# Patient Record
Sex: Female | Born: 1935 | Race: White | Hispanic: No | Smoking: Never smoker
Health system: Southern US, Community
[De-identification: ages and names within clinical notes are randomized; demographics above are authoritative.]

## PROBLEM LIST (undated history)

## (undated) DIAGNOSIS — R21 Rash and other nonspecific skin eruption: Secondary | ICD-10-CM

## (undated) DIAGNOSIS — L570 Actinic keratosis: Secondary | ICD-10-CM

## (undated) DIAGNOSIS — H3530 Unspecified macular degeneration: Secondary | ICD-10-CM

## (undated) DIAGNOSIS — R5381 Other malaise: Secondary | ICD-10-CM

## (undated) DIAGNOSIS — E559 Vitamin D deficiency, unspecified: Secondary | ICD-10-CM

## (undated) DIAGNOSIS — I5042 Chronic combined systolic (congestive) and diastolic (congestive) heart failure: Secondary | ICD-10-CM

## (undated) DIAGNOSIS — K5730 Diverticulosis of large intestine without perforation or abscess without bleeding: Secondary | ICD-10-CM

## (undated) DIAGNOSIS — R0600 Dyspnea, unspecified: Secondary | ICD-10-CM

## (undated) DIAGNOSIS — M6281 Muscle weakness (generalized): Secondary | ICD-10-CM

## (undated) DIAGNOSIS — R54 Age-related physical debility: Secondary | ICD-10-CM

## (undated) DIAGNOSIS — R252 Cramp and spasm: Secondary | ICD-10-CM

## (undated) DIAGNOSIS — I10 Essential (primary) hypertension: Secondary | ICD-10-CM

## (undated) DIAGNOSIS — J301 Allergic rhinitis due to pollen: Secondary | ICD-10-CM

## (undated) DIAGNOSIS — I872 Venous insufficiency (chronic) (peripheral): Secondary | ICD-10-CM

## (undated) DIAGNOSIS — R002 Palpitations: Secondary | ICD-10-CM

## (undated) DIAGNOSIS — K449 Diaphragmatic hernia without obstruction or gangrene: Secondary | ICD-10-CM

## (undated) DIAGNOSIS — R32 Unspecified urinary incontinence: Secondary | ICD-10-CM

## (undated) DIAGNOSIS — B353 Tinea pedis: Secondary | ICD-10-CM

## (undated) DIAGNOSIS — E039 Hypothyroidism, unspecified: Secondary | ICD-10-CM

## (undated) DIAGNOSIS — G4700 Insomnia, unspecified: Secondary | ICD-10-CM

## (undated) DIAGNOSIS — H8110 Benign paroxysmal vertigo, unspecified ear: Secondary | ICD-10-CM

## (undated) DIAGNOSIS — M25569 Pain in unspecified knee: Secondary | ICD-10-CM

## (undated) DIAGNOSIS — R05 Cough: Secondary | ICD-10-CM

## (undated) DIAGNOSIS — N72 Inflammatory disease of cervix uteri: Secondary | ICD-10-CM

## (undated) DIAGNOSIS — G44209 Tension-type headache, unspecified, not intractable: Secondary | ICD-10-CM

## (undated) DIAGNOSIS — M545 Low back pain: Secondary | ICD-10-CM

## (undated) DIAGNOSIS — N183 Chronic kidney disease, stage 3 unspecified: Secondary | ICD-10-CM

## (undated) DIAGNOSIS — K224 Dyskinesia of esophagus: Secondary | ICD-10-CM

## (undated) DIAGNOSIS — I5032 Chronic diastolic (congestive) heart failure: Secondary | ICD-10-CM

## (undated) DIAGNOSIS — K279 Peptic ulcer, site unspecified, unspecified as acute or chronic, without hemorrhage or perforation: Secondary | ICD-10-CM

## (undated) DIAGNOSIS — N390 Urinary tract infection, site not specified: Secondary | ICD-10-CM

## (undated) DIAGNOSIS — R5383 Other fatigue: Secondary | ICD-10-CM

## (undated) DIAGNOSIS — M79609 Pain in unspecified limb: Secondary | ICD-10-CM

## (undated) DIAGNOSIS — I8390 Asymptomatic varicose veins of unspecified lower extremity: Secondary | ICD-10-CM

## (undated) DIAGNOSIS — I340 Nonrheumatic mitral (valve) insufficiency: Secondary | ICD-10-CM

## (undated) DIAGNOSIS — R42 Dizziness and giddiness: Secondary | ICD-10-CM

## (undated) DIAGNOSIS — Z79899 Other long term (current) drug therapy: Secondary | ICD-10-CM

## (undated) DIAGNOSIS — M542 Cervicalgia: Secondary | ICD-10-CM

## (undated) DIAGNOSIS — H0230 Blepharochalasis unspecified eye, unspecified eyelid: Secondary | ICD-10-CM

## (undated) DIAGNOSIS — M25561 Pain in right knee: Secondary | ICD-10-CM

## (undated) DIAGNOSIS — K210 Gastro-esophageal reflux disease with esophagitis: Secondary | ICD-10-CM

## (undated) DIAGNOSIS — R269 Unspecified abnormalities of gait and mobility: Secondary | ICD-10-CM

## (undated) DIAGNOSIS — K5900 Constipation, unspecified: Secondary | ICD-10-CM

## (undated) DIAGNOSIS — K648 Other hemorrhoids: Secondary | ICD-10-CM

## (undated) DIAGNOSIS — G2581 Restless legs syndrome: Secondary | ICD-10-CM

## (undated) HISTORY — DX: Cough: R05

## (undated) HISTORY — DX: Gastro-esophageal reflux disease with esophagitis: K21.0

## (undated) HISTORY — DX: Blepharochalasis unspecified eye, unspecified eyelid: H02.30

## (undated) HISTORY — DX: Peptic ulcer, site unspecified, unspecified as acute or chronic, without hemorrhage or perforation: K27.9

## (undated) HISTORY — DX: Urinary tract infection, site not specified: N39.0

## (undated) HISTORY — DX: Cervicalgia: M54.2

## (undated) HISTORY — DX: Diaphragmatic hernia without obstruction or gangrene: K44.9

## (undated) HISTORY — DX: Insomnia, unspecified: G47.00

## (undated) HISTORY — DX: Diverticulosis of large intestine without perforation or abscess without bleeding: K57.30

## (undated) HISTORY — DX: Other malaise: R53.81

## (undated) HISTORY — DX: Vitamin D deficiency, unspecified: E55.9

## (undated) HISTORY — DX: Nonrheumatic mitral (valve) insufficiency: I34.0

## (undated) HISTORY — DX: Pain in unspecified knee: M25.569

## (undated) HISTORY — DX: Unspecified abnormalities of gait and mobility: R26.9

## (undated) HISTORY — DX: Palpitations: R00.2

## (undated) HISTORY — DX: Low back pain: M54.5

## (undated) HISTORY — DX: Venous insufficiency (chronic) (peripheral): I87.2

## (undated) HISTORY — DX: Restless legs syndrome: G25.81

## (undated) HISTORY — DX: Tension-type headache, unspecified, not intractable: G44.209

## (undated) HISTORY — DX: Dyspnea, unspecified: R06.00

## (undated) HISTORY — DX: Dizziness and giddiness: R42

## (undated) HISTORY — DX: Essential (primary) hypertension: I10

## (undated) HISTORY — DX: Other hemorrhoids: K64.8

## (undated) HISTORY — DX: Age-related physical debility: R54

## (undated) HISTORY — DX: Pain in right knee: M25.561

## (undated) HISTORY — DX: Actinic keratosis: L57.0

## (undated) HISTORY — DX: Inflammatory disease of cervix uteri: N72

## (undated) HISTORY — DX: Unspecified macular degeneration: H35.30

## (undated) HISTORY — DX: Constipation, unspecified: K59.00

## (undated) HISTORY — DX: Rash and other nonspecific skin eruption: R21

## (undated) HISTORY — DX: Other fatigue: R53.83

## (undated) HISTORY — DX: Other long term (current) drug therapy: Z79.899

## (undated) HISTORY — DX: Allergic rhinitis due to pollen: J30.1

## (undated) HISTORY — DX: Muscle weakness (generalized): M62.81

## (undated) HISTORY — DX: Asymptomatic varicose veins of unspecified lower extremity: I83.90

## (undated) HISTORY — DX: Tinea pedis: B35.3

## (undated) HISTORY — DX: Pain in unspecified limb: M79.609

## (undated) HISTORY — DX: Unspecified urinary incontinence: R32

## (undated) HISTORY — DX: Dyskinesia of esophagus: K22.4

## (undated) HISTORY — DX: Benign paroxysmal vertigo, unspecified ear: H81.10

## (undated) HISTORY — DX: Cramp and spasm: R25.2

---
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
## 1978-09-06 DIAGNOSIS — H3530 Unspecified macular degeneration: Secondary | ICD-10-CM

## 1978-09-06 HISTORY — DX: Unspecified macular degeneration: H35.30

## 1980-01-06 HISTORY — PX: CATARACT EXTRACTION W/ INTRAOCULAR LENS  IMPLANT, BILATERAL: SHX1307

## 1996-01-06 DIAGNOSIS — K2100 Gastro-esophageal reflux disease with esophagitis, without bleeding: Secondary | ICD-10-CM

## 1996-01-06 DIAGNOSIS — K279 Peptic ulcer, site unspecified, unspecified as acute or chronic, without hemorrhage or perforation: Secondary | ICD-10-CM

## 1996-01-06 DIAGNOSIS — K5730 Diverticulosis of large intestine without perforation or abscess without bleeding: Secondary | ICD-10-CM

## 1996-01-06 HISTORY — DX: Diverticulosis of large intestine without perforation or abscess without bleeding: K57.30

## 1996-01-06 HISTORY — DX: Peptic ulcer, site unspecified, unspecified as acute or chronic, without hemorrhage or perforation: K27.9

## 1996-01-06 HISTORY — DX: Gastro-esophageal reflux disease with esophagitis, without bleeding: K21.00

## 1998-01-05 DIAGNOSIS — I872 Venous insufficiency (chronic) (peripheral): Secondary | ICD-10-CM

## 1998-01-05 HISTORY — PX: COLONOSCOPY: SHX174

## 1998-01-05 HISTORY — DX: Venous insufficiency (chronic) (peripheral): I87.2

## 1998-12-03 ENCOUNTER — Ambulatory Visit (HOSPITAL_COMMUNITY): Admission: RE | Admit: 1998-12-03 | Discharge: 1998-12-03 | Payer: Self-pay | Admitting: Internal Medicine

## 1998-12-03 ENCOUNTER — Encounter: Payer: Self-pay | Admitting: Internal Medicine

## 1999-01-01 ENCOUNTER — Inpatient Hospital Stay (HOSPITAL_COMMUNITY): Admission: EM | Admit: 1999-01-01 | Discharge: 1999-01-05 | Payer: Self-pay | Admitting: Family Medicine

## 1999-01-02 ENCOUNTER — Encounter: Payer: Self-pay | Admitting: Internal Medicine

## 1999-01-03 ENCOUNTER — Encounter (HOSPITAL_BASED_OUTPATIENT_CLINIC_OR_DEPARTMENT_OTHER): Payer: Self-pay | Admitting: Internal Medicine

## 1999-01-31 ENCOUNTER — Encounter: Admission: RE | Admit: 1999-01-31 | Discharge: 1999-01-31 | Payer: Self-pay | Admitting: Internal Medicine

## 1999-01-31 ENCOUNTER — Encounter: Payer: Self-pay | Admitting: Internal Medicine

## 1999-03-06 ENCOUNTER — Encounter: Payer: Self-pay | Admitting: Internal Medicine

## 1999-03-06 ENCOUNTER — Emergency Department (HOSPITAL_COMMUNITY): Admission: EM | Admit: 1999-03-06 | Discharge: 1999-03-06 | Payer: Self-pay | Admitting: Internal Medicine

## 2000-02-13 ENCOUNTER — Encounter: Payer: Self-pay | Admitting: Internal Medicine

## 2000-02-13 ENCOUNTER — Encounter: Admission: RE | Admit: 2000-02-13 | Discharge: 2000-02-13 | Payer: Self-pay | Admitting: Internal Medicine

## 2001-03-29 ENCOUNTER — Encounter: Payer: Self-pay | Admitting: Internal Medicine

## 2001-03-29 ENCOUNTER — Encounter: Admission: RE | Admit: 2001-03-29 | Discharge: 2001-03-29 | Payer: Self-pay | Admitting: Internal Medicine

## 2001-11-07 ENCOUNTER — Emergency Department (HOSPITAL_COMMUNITY): Admission: EM | Admit: 2001-11-07 | Discharge: 2001-11-07 | Payer: Self-pay | Admitting: Emergency Medicine

## 2001-11-07 ENCOUNTER — Encounter: Payer: Self-pay | Admitting: *Deleted

## 2002-03-29 ENCOUNTER — Encounter: Payer: Self-pay | Admitting: Internal Medicine

## 2002-03-29 ENCOUNTER — Encounter: Admission: RE | Admit: 2002-03-29 | Discharge: 2002-03-29 | Payer: Self-pay | Admitting: Internal Medicine

## 2002-04-06 HISTORY — PX: LESION EXCISION: SHX5167

## 2002-05-03 ENCOUNTER — Encounter: Payer: Self-pay | Admitting: Internal Medicine

## 2002-05-03 ENCOUNTER — Inpatient Hospital Stay (HOSPITAL_COMMUNITY): Admission: EM | Admit: 2002-05-03 | Discharge: 2002-05-05 | Payer: Self-pay | Admitting: Emergency Medicine

## 2002-05-04 ENCOUNTER — Encounter: Payer: Self-pay | Admitting: Internal Medicine

## 2002-05-04 ENCOUNTER — Encounter (INDEPENDENT_AMBULATORY_CARE_PROVIDER_SITE_OTHER): Payer: Self-pay | Admitting: Cardiology

## 2002-05-05 ENCOUNTER — Encounter: Payer: Self-pay | Admitting: Internal Medicine

## 2003-01-06 HISTORY — PX: SKIN CANCER EXCISION: SHX779

## 2003-04-20 ENCOUNTER — Encounter: Admission: RE | Admit: 2003-04-20 | Discharge: 2003-04-20 | Payer: Self-pay | Admitting: Internal Medicine

## 2003-09-08 ENCOUNTER — Emergency Department (HOSPITAL_COMMUNITY): Admission: EM | Admit: 2003-09-08 | Discharge: 2003-09-08 | Payer: Self-pay | Admitting: Emergency Medicine

## 2003-10-03 DIAGNOSIS — K449 Diaphragmatic hernia without obstruction or gangrene: Secondary | ICD-10-CM

## 2003-10-03 HISTORY — DX: Diaphragmatic hernia without obstruction or gangrene: K44.9

## 2004-06-04 ENCOUNTER — Encounter: Admission: RE | Admit: 2004-06-04 | Discharge: 2004-06-04 | Payer: Self-pay | Admitting: Internal Medicine

## 2004-09-10 DIAGNOSIS — E039 Hypothyroidism, unspecified: Secondary | ICD-10-CM | POA: Insufficient documentation

## 2004-10-05 HISTORY — PX: CHOLECYSTECTOMY, LAPAROSCOPIC: SHX56

## 2004-10-26 ENCOUNTER — Emergency Department (HOSPITAL_COMMUNITY): Admission: EM | Admit: 2004-10-26 | Discharge: 2004-10-27 | Payer: Self-pay | Admitting: Emergency Medicine

## 2004-11-03 ENCOUNTER — Encounter (INDEPENDENT_AMBULATORY_CARE_PROVIDER_SITE_OTHER): Payer: Self-pay | Admitting: Specialist

## 2004-11-03 ENCOUNTER — Ambulatory Visit (HOSPITAL_COMMUNITY): Admission: RE | Admit: 2004-11-03 | Discharge: 2004-11-04 | Payer: Self-pay

## 2005-06-30 DIAGNOSIS — G2581 Restless legs syndrome: Secondary | ICD-10-CM

## 2005-06-30 HISTORY — DX: Restless legs syndrome: G25.81

## 2005-07-01 ENCOUNTER — Encounter: Admission: RE | Admit: 2005-07-01 | Discharge: 2005-07-01 | Payer: Self-pay | Admitting: Internal Medicine

## 2005-10-07 DIAGNOSIS — R252 Cramp and spasm: Secondary | ICD-10-CM

## 2005-10-07 HISTORY — DX: Cramp and spasm: R25.2

## 2006-02-10 DIAGNOSIS — R32 Unspecified urinary incontinence: Secondary | ICD-10-CM

## 2006-02-10 HISTORY — DX: Unspecified urinary incontinence: R32

## 2006-03-21 ENCOUNTER — Emergency Department (HOSPITAL_COMMUNITY): Admission: EM | Admit: 2006-03-21 | Discharge: 2006-03-21 | Payer: Self-pay | Admitting: Emergency Medicine

## 2006-04-01 ENCOUNTER — Encounter: Admission: RE | Admit: 2006-04-01 | Discharge: 2006-04-01 | Payer: Self-pay | Admitting: *Deleted

## 2006-04-01 DIAGNOSIS — R5381 Other malaise: Secondary | ICD-10-CM

## 2006-04-01 HISTORY — DX: Other malaise: R53.81

## 2006-04-04 ENCOUNTER — Inpatient Hospital Stay (HOSPITAL_COMMUNITY): Admission: EM | Admit: 2006-04-04 | Discharge: 2006-04-10 | Payer: Self-pay | Admitting: Emergency Medicine

## 2006-04-12 DIAGNOSIS — K224 Dyskinesia of esophagus: Secondary | ICD-10-CM

## 2006-04-12 HISTORY — DX: Dyskinesia of esophagus: K22.4

## 2006-04-13 ENCOUNTER — Inpatient Hospital Stay (HOSPITAL_COMMUNITY): Admission: EM | Admit: 2006-04-13 | Discharge: 2006-04-19 | Payer: Self-pay | Admitting: Emergency Medicine

## 2006-04-21 DIAGNOSIS — J301 Allergic rhinitis due to pollen: Secondary | ICD-10-CM

## 2006-04-21 HISTORY — DX: Allergic rhinitis due to pollen: J30.1

## 2006-04-27 ENCOUNTER — Ambulatory Visit: Payer: Self-pay | Admitting: Critical Care Medicine

## 2006-04-29 ENCOUNTER — Inpatient Hospital Stay (HOSPITAL_COMMUNITY): Admission: EM | Admit: 2006-04-29 | Discharge: 2006-05-06 | Payer: Self-pay | Admitting: Emergency Medicine

## 2006-05-05 ENCOUNTER — Ambulatory Visit: Payer: Self-pay | Admitting: Vascular Surgery

## 2006-05-10 ENCOUNTER — Inpatient Hospital Stay (HOSPITAL_COMMUNITY): Admission: EM | Admit: 2006-05-10 | Discharge: 2006-05-14 | Payer: Self-pay | Admitting: *Deleted

## 2006-06-25 DIAGNOSIS — R42 Dizziness and giddiness: Secondary | ICD-10-CM

## 2006-06-25 HISTORY — DX: Dizziness and giddiness: R42

## 2006-07-14 DIAGNOSIS — B353 Tinea pedis: Secondary | ICD-10-CM

## 2006-07-14 HISTORY — DX: Tinea pedis: B35.3

## 2006-07-21 ENCOUNTER — Encounter: Admission: RE | Admit: 2006-07-21 | Discharge: 2006-07-21 | Payer: Self-pay | Admitting: Internal Medicine

## 2006-10-11 ENCOUNTER — Ambulatory Visit: Payer: Self-pay | Admitting: Internal Medicine

## 2007-05-12 DIAGNOSIS — H0230 Blepharochalasis unspecified eye, unspecified eyelid: Secondary | ICD-10-CM

## 2007-05-12 DIAGNOSIS — G4700 Insomnia, unspecified: Secondary | ICD-10-CM

## 2007-05-12 HISTORY — DX: Blepharochalasis unspecified eye, unspecified eyelid: H02.30

## 2007-05-12 HISTORY — DX: Insomnia, unspecified: G47.00

## 2007-08-04 DIAGNOSIS — M6281 Muscle weakness (generalized): Secondary | ICD-10-CM

## 2007-08-04 HISTORY — DX: Muscle weakness (generalized): M62.81

## 2007-08-19 ENCOUNTER — Encounter: Admission: RE | Admit: 2007-08-19 | Discharge: 2007-08-19 | Payer: Self-pay | Admitting: Internal Medicine

## 2007-12-22 DIAGNOSIS — N72 Inflammatory disease of cervix uteri: Secondary | ICD-10-CM

## 2007-12-22 DIAGNOSIS — L570 Actinic keratosis: Secondary | ICD-10-CM

## 2007-12-22 DIAGNOSIS — M79609 Pain in unspecified limb: Secondary | ICD-10-CM

## 2007-12-22 HISTORY — DX: Pain in unspecified limb: M79.609

## 2007-12-22 HISTORY — DX: Actinic keratosis: L57.0

## 2007-12-22 HISTORY — DX: Inflammatory disease of cervix uteri: N72

## 2008-01-05 DIAGNOSIS — M545 Low back pain, unspecified: Secondary | ICD-10-CM

## 2008-01-05 HISTORY — DX: Low back pain, unspecified: M54.50

## 2008-06-05 HISTORY — PX: TOOTH EXTRACTION: SHX859

## 2008-07-25 IMAGING — CR DG CHEST 2V
2 series · 2 of 2 positions shown · non-contrast
Comparison: 03/21/06.

CLINICAL DATA: Cough for 2 weeks. 
 CHEST ? 2 VIEW:

[view not recorded (1 of 2)]
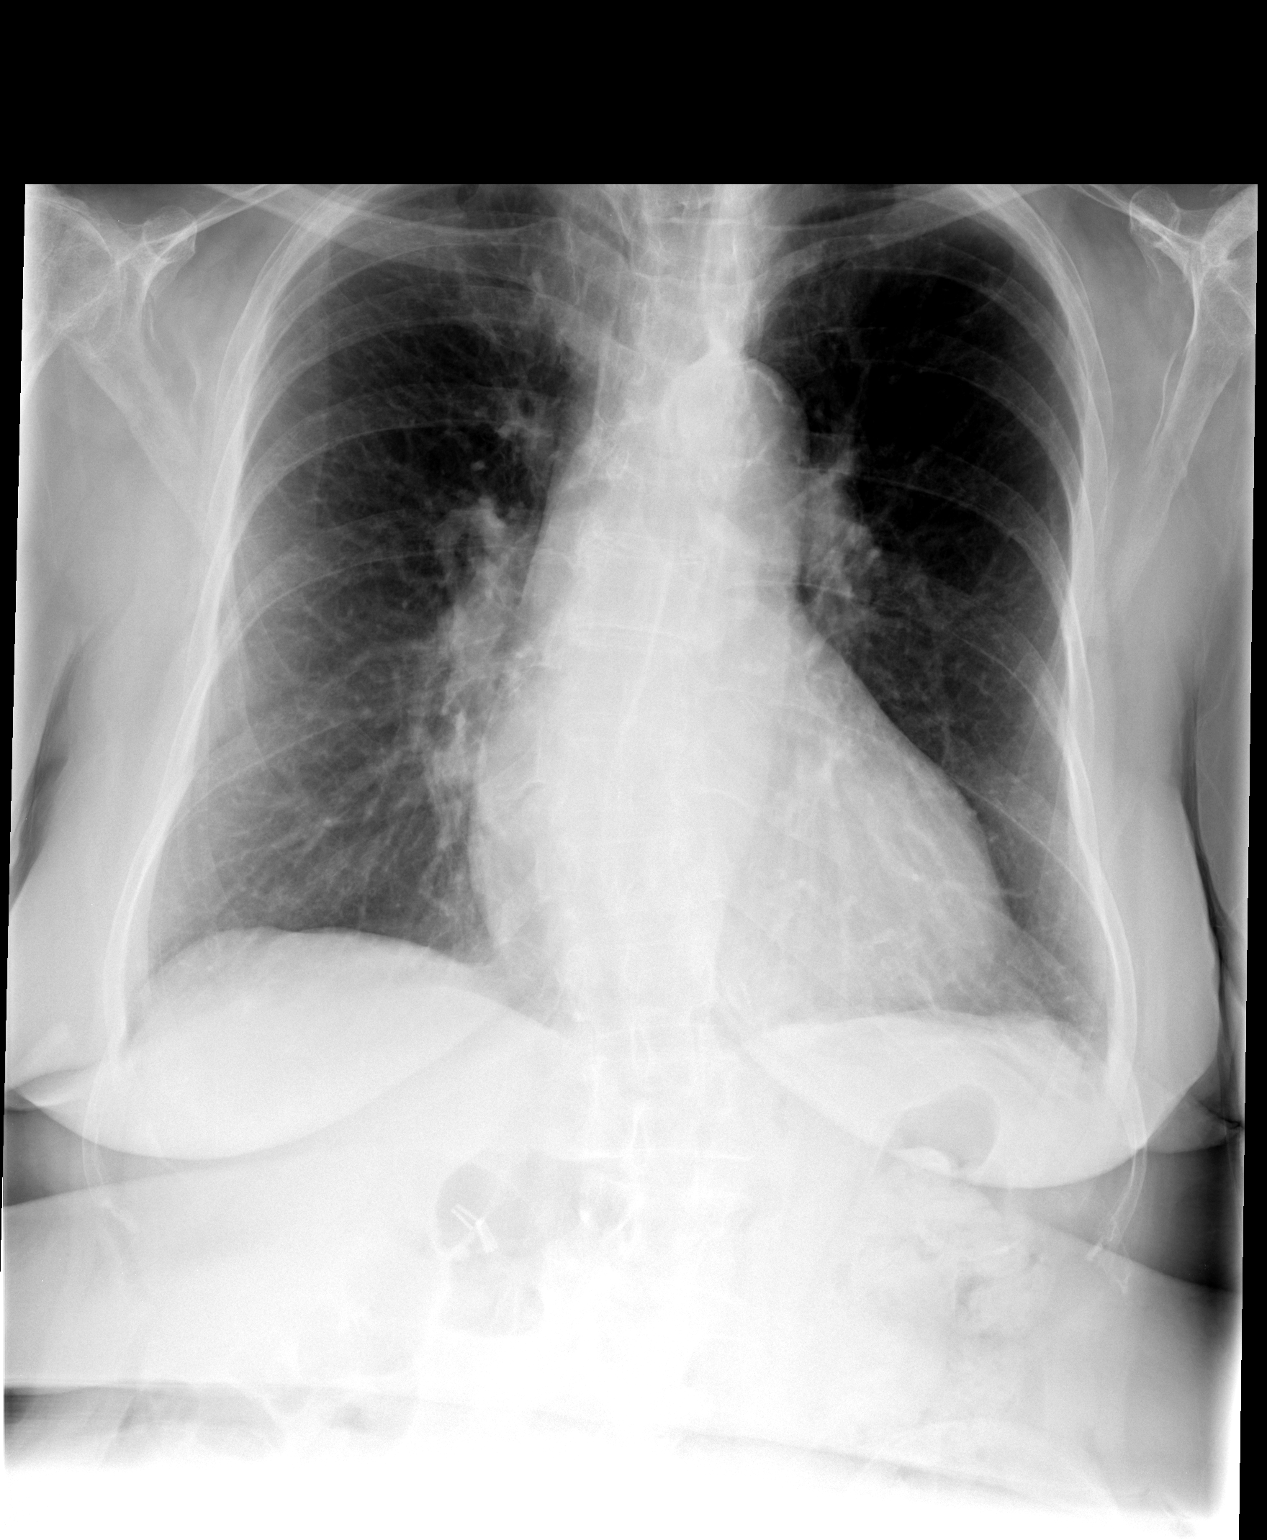

[view not recorded (2 of 2)]
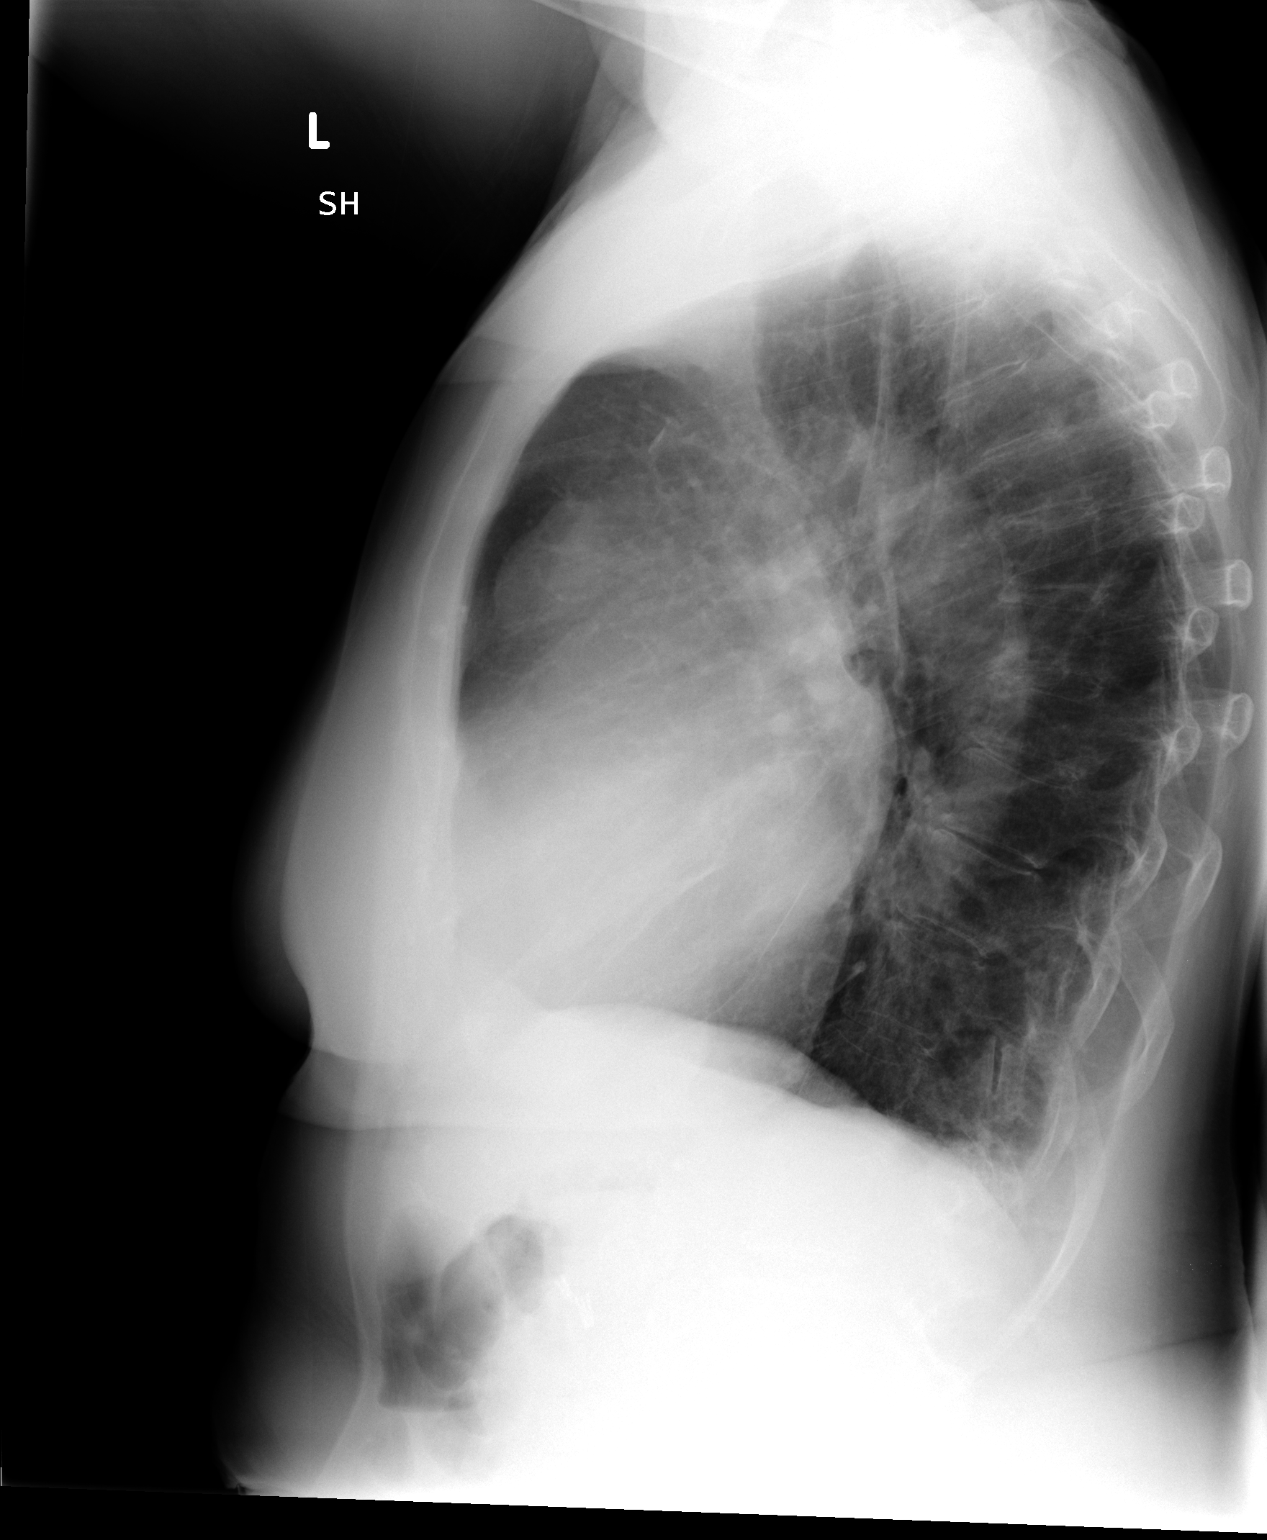

[2 of 2 positions shown; findings below may reference images not displayed]

FINDINGS: The heart is enlarged.  The thoracic aorta is calcified and unfolded.  The lungs show some slightly increased markings which appear chronic.  There is no evidence of pneumonia.  A lobular contour in the anterior mediastinum seen on the lateral view is again present but has been seen on previous films.  It is possible this could represent an aneurysm.  I think it is becoming slightly increasingly prominent over time.  A CT scan might be considered for evaluation of this.
IMPRESSION: 1.  Mild cardiomegaly. 
 2.  Chronic lung markings without evidence of acute pneumonia. 
 3.  Increasingly prominent lobular shadow in the anterior mediastinum on the lateral view.  This could represent an aneurysm or conceivably a mass.  CT scan suggested if clinically appropriate.

## 2008-08-20 ENCOUNTER — Ambulatory Visit (HOSPITAL_COMMUNITY): Admission: RE | Admit: 2008-08-20 | Discharge: 2008-08-20 | Payer: Self-pay | Admitting: Internal Medicine

## 2009-01-25 DIAGNOSIS — E559 Vitamin D deficiency, unspecified: Secondary | ICD-10-CM

## 2009-01-25 HISTORY — DX: Vitamin D deficiency, unspecified: E55.9

## 2009-05-09 DIAGNOSIS — R269 Unspecified abnormalities of gait and mobility: Secondary | ICD-10-CM

## 2009-05-09 HISTORY — DX: Unspecified abnormalities of gait and mobility: R26.9

## 2009-08-15 ENCOUNTER — Ambulatory Visit: Payer: Self-pay | Admitting: Cardiology

## 2009-12-12 ENCOUNTER — Ambulatory Visit: Payer: Self-pay | Admitting: Cardiology

## 2010-04-01 ENCOUNTER — Encounter: Payer: Self-pay | Admitting: Internal Medicine

## 2010-04-16 ENCOUNTER — Encounter: Payer: Self-pay | Admitting: *Deleted

## 2010-04-17 ENCOUNTER — Encounter: Payer: Self-pay | Admitting: Cardiology

## 2010-04-17 ENCOUNTER — Ambulatory Visit (INDEPENDENT_AMBULATORY_CARE_PROVIDER_SITE_OTHER): Payer: Medicare Other | Admitting: Cardiology

## 2010-04-17 DIAGNOSIS — I509 Heart failure, unspecified: Secondary | ICD-10-CM

## 2010-04-17 DIAGNOSIS — M48061 Spinal stenosis, lumbar region without neurogenic claudication: Secondary | ICD-10-CM | POA: Insufficient documentation

## 2010-04-17 DIAGNOSIS — I059 Rheumatic mitral valve disease, unspecified: Secondary | ICD-10-CM

## 2010-04-17 DIAGNOSIS — I340 Nonrheumatic mitral (valve) insufficiency: Secondary | ICD-10-CM

## 2010-04-17 DIAGNOSIS — E039 Hypothyroidism, unspecified: Secondary | ICD-10-CM | POA: Insufficient documentation

## 2010-04-17 DIAGNOSIS — I119 Hypertensive heart disease without heart failure: Secondary | ICD-10-CM

## 2010-04-17 DIAGNOSIS — R002 Palpitations: Secondary | ICD-10-CM | POA: Insufficient documentation

## 2010-04-17 DIAGNOSIS — I5032 Chronic diastolic (congestive) heart failure: Secondary | ICD-10-CM

## 2010-04-17 NOTE — Assessment & Plan Note (Signed)
Recently the patient has been doing well in terms of her palpitations.  She's not been aware of any recent arrhythmia.  Her electrocardiogram today shows normal sinus rhythm at a rate of 64.

## 2010-04-17 NOTE — Assessment & Plan Note (Signed)
The patient has a past history of mitral regurgitation.  Her last echocardiogram was 05/10/09.  She has not had any recent change in cardiac symptoms.  She's not having any paroxysmal nocturnal dyspnea.  She's not having any exertional chest pain or angina.

## 2010-04-17 NOTE — Assessment & Plan Note (Signed)
The patient has been having a lot of difficulty recently with low back pain.  Dr. Otelia Sergeant is her orthopedist.  Dr. Alvester Morin recently gave her a steroid injection into the spine which has helped considerably.

## 2010-04-17 NOTE — Progress Notes (Signed)
HPI:   Current Outpatient Prescriptions  Medication Sig Dispense Refill  . aspirin 81 MG tablet Take 81 mg by mouth daily.        . Calcium & Magnesium Carbonates (MYLANTA PO) Take by mouth as needed.        . calcium-vitamin D (OSCAL WITH D) 500-200 MG-UNIT per tablet Take 1 tablet by mouth daily.        . Cetirizine HCl (ZYRTEC PO) Take by mouth as needed.        . furosemide (LASIX) 20 MG tablet Take 20 mg by mouth daily.        . GuaiFENesin (MUCINEX PO) Take by mouth as needed.        Marland Kitchen levothyroxine (SYNTHROID) 100 MCG tablet Take 100 mcg by mouth daily.        Marland Kitchen losartan (COZAAR) 50 MG tablet Take 50 mg by mouth daily.        . metoprolol tartrate (LOPRESSOR) 25 MG tablet Take 25 mg by mouth daily. 1/2 daily        . multivitamin (THERAGRAN) per tablet Take 1 tablet by mouth daily.        . Potassium (POTASSIMIN PO) Take by mouth.        . RABEprazole Sodium (ACIPHEX PO) Take by mouth daily.        Marland Kitchen SALINE NASAL SPRAY NA by Nasal route as needed.        . traMADol-acetaminophen (ULTRACET) 37.5-325 MG per tablet Take 1 tablet by mouth every 6 (six) hours as needed.        . triamcinolone (KENALOG) 0.1 % lotion Apply 1 application topically as needed.        . NYSTATIN PO Take by mouth as needed.          Allergies  Allergen Reactions  . Ace Inhibitors Cough  . Codeine   . Darvon   . Sulfa Drugs Cross Reactors     Patient Active Problem List  Diagnoses  . Benign hypertensive heart disease without heart failure  . Mitral regurgitation  . Palpitations  . Chronic diastolic congestive heart failure  . Hypothyroidism  . Spinal stenosis, lumbar    History  Smoking status  . Never Smoker   Smokeless tobacco  . Never Used    History  Alcohol Use No    No family history on file.  Review of Systems: The patient denies any heat or cold intolerance.  No weight gain or weight loss.  The patient denies headaches or blurry vision.  There is no cough or sputum  production.  The patient denies dizziness.  There is no hematuria or hematochezia.  The patient denies any muscle aches or arthritis.  The patient denies any rash.  The patient denies frequent falling or instability.  There is no history of depression or anxiety.  All other systems were reviewed and are negative.   Physical Exam: Filed Vitals:   04/17/10 0959  BP: 130/70  Pulse: 64  The general appearance reveals an alert elderly woman in no distress.Pupils equal and reactive.   Extraocular Movements are full.  There is no scleral icterus.  The mouth and pharynx are normal.  The neck is supple.  The carotids reveal no bruits.  The jugular venous pressure is normal.  The thyroid is not enlarged.  There is no lymphadenopathy.The chest is clear to percussion and auscultation. There are no rales or rhonchi. Expansion of the chest is symmetrical.  Heart reveals a  soft murmur of mitral regurgitation at apex.The abdomen is soft and nontender. Bowel sounds are normal. The liver and spleen are not enlarged. There Are no abdominal masses. There are no bruits.  There is trace peripheral edema.  No evidence of phlebitis.Strength is normal and symmetrical in all extremities.  There is no lateralizing weakness.  There are no sensory deficits.    Assessment / Plan: Continue same medication.  Recheck in 4 months for followup office visit.  Consider followup echocardiogram after next visit.

## 2010-05-23 NOTE — Consult Note (Signed)
Ann [name], Ann [name]                 ACCOUNT NO.:  [id number]   MEDICAL RECORD NO.:  [id number]          PATIENT TYPE:  INP   LOCATION:  2001                         FACILITY:  [facility]   PHYSICIAN:  Cassell Clement, M.D. DATE OF BIRTH:  12-15-1918   DATE OF CONSULTATION:  05/12/2006  DATE OF DISCHARGE:                                 CONSULTATION   This 77 year old Caucasian female is seen by me for the first time  today.  I had previously seen her husband before his death.  She has a  history of frequent hospital admissions over the past month.  I believe  this is her fourth admission.  This time she was admitted on May 10, 2006, because of increased shortness of breath.  She had also had a  change in bowel habits with diarrhea.  She has a prior history of C.  difficile colitis and a history of heart failure and hypothyroidism.  She has a history of compensated congestive heart failure.  According to  the admission history and physical she had a recent echocardiogram but I  am unable to locate the results in the computer today and we will try to  follow up on that.  She does have a history of known mitral  regurgitation.  Other past illnesses include anemia of chronic disease,  and a history of hilar adenopathy and a 10-mm spiculated density at the  left upper lobe of the lung being evaluated and followed by Dr. Danise Mina, a pulmonologist.   HOME MEDICATIONS:  1. AcipHex 20 mg daily.  2. K-Dur 20 mEq daily.  3. Aspirin 81 mg daily.  4. Lasix 20 mg daily.  5. Synthroid 100 mcg daily.  6. Multivitamin one daily.  7. Zyrtec 5 mg daily.  8. Vancomycin 250 mg p.o. daily.  9. Symbicort two puffs b.i.d.  10.Xanax p.r.n.  11.Carafate.   FAMILY HISTORY:  Reveals that her father died of heart disease.  Mother  died of a stroke at age 39.   SOCIAL HISTORY:  Reveals that she is a widow.  She does not use alcohol  or tobacco.   CARDIAC HISTORY:  Reveals no history of known ischemic  heart disease or  myocardial infarction.  She does give a history of a longstanding heart  murmur.  She was not familiar with the term ACE inhibitor but I see that  at least on this admission she is on a low dose of an ACE inhibitor for  her mitral regurgitation.   PHYSICAL EXAMINATION TODAY:  VITAL SIGNS:  Her blood pressure is 100/60,  the pulse is 80 and sinus rhythm, respirations are normal.  GENERAL:  This is a pale elderly woman in no acute distress lying flat.  Jugular venous pressure normal.  Carotid:  Normal upstroke, no bruits.  CHEST:  Reveals fine rales at the left base.  HEART:  Reveals a grade 3/6 holosystolic murmur of mitral regurgitation  at the apex.  ABDOMEN:  Soft, nontender.  Active bowel sounds.  EXTREMITIES:  Reveal good dorsalis pedis and posterior tibial pulses and  no peripheral edema.   LABORATORY DATA:  Shows hemoglobin 11.4; hematocrit 33.8; platelet count  elevated at 500,000; white count 6400.  Sodium 136, potassium 3.9, BUN  9, creatinine 0.85, blood sugar 90.  Clotting studies are normal.  Liver  function studies normal except for albumin of 2.9.  Her lipids show LDL  of 100, cholesterol 157, triglycerides of 66.  Her hemoglobin A1c is  5.6.  B natriuretic peptide initially 594, now 325.   IMPRESSION:  1. Compensated congestive heart failure.  2. Resolving Clostridium difficile colitis.  3. Hypothyroidism.  4. Murmur of mitral regurgitation.   DISPOSITION:  We will get a followup PA and lateral chest x-ray today to  assess the state of her left pleural effusion.  If we are unable to  locate a recent echocardiogram, we will go ahead and get another one  today.   Many thanks for the opportunity to see this pleasant woman with you.  Will follow with you.           ______________________________  Cassell Clement, M.D.     TB/MEDQ  D:  05/12/2006  T:  05/12/2006  Job:  [id number]   cc:   [facility]  Lenon Curt. Chilton Si, M.D.

## 2010-05-23 NOTE — Discharge Summary (Signed)
Ann [name], CRANFIELD                 ACCOUNT NO.:  [id number]   MEDICAL RECORD NO.:  [id number]          PATIENT TYPE:  INP   LOCATION:  6706                         FACILITY:  [facility]   PHYSICIAN:  Herbie Saxon, MDDATE OF BIRTH:  Sep 07, 1918   DATE OF ADMISSION:  04/04/2006  DATE OF DISCHARGE:  04/10/2006                               DISCHARGE SUMMARY   DISCHARGE DIAGNOSES:  1. Esophageal dysmotility.  2. Bronchitis resolved.  3. Constipation resolved.  4. Congestive heart failure, stable.  5. Hypernatremia resolved.  6. History of hypothyroidism.  7. Anxiety.  8. Hard of hearing.  9. Gastroesophageal reflux disease.  10.Anemia of chronic disease.  11.Medical and diet noncompliant.  12.Severe mitral regurgitation.   HOSPITAL COURSE:  This 77 year old lady was admitted with cough and  weakness about three weeks.  She had been taking Zithromax on an  outpatient basis for upper respiratory tract infection.  The chest  x-ray done on this admission showed left upper lobe scar.  This needs to  be followed up as an outpatient by the primary care physician with CT  chest in 3-6 months and a possible pulmonary consult.  She had mild UTI  on admission, and this has also been treated with the antibiotic  coverage she was started with, that is quinolone.  Avelox was used to  cover both the upper respiratory infection and the urinary tract  infection.  She was on IV fluid hydration for dehydration.  This has  improved.  Her dose of Lasix had to be increased for a little while for  2 days.  Her BNP was noticed to be elevated to 588. Hemoccult was  negative with this admission.  The swallowing study did show that she  has esophageal dysmotility and mild pharyngeal dysphagia.  Speech is  recommending taking liquids.  However, patient is not compliant.  She  still wants to be drinking thin liquids despite the fact that she has  been educated on the fact that she has a patulous esophagus  with very  poor motility.  No stricture or mass is noted.  She has been given  gentle physical therapy for deconditioning.  Her reflux symptoms are  mostly improved with the addition of Carafate and Reglan with Bentyl  also being used.  The patient was placed on Xanax 0.25 q.12 h. for  anxiety.  Echocardiogram on this admission shows severe mitral  regurgitation and ejection fraction of 50%.  The patient was refusing to  take subcutaneous Lovenox throughout admission.  She has been discharged  to home in stable condition to follow up with her primary care  physician, Dr. Elray Mcgregor, in a week.  Dr. Chilton Si set up pulmonary and  gastroenterology followup as outpatient.   MEDICATIONS ON DISCHARGE:  1. Synthroid 100 mcg daily.  2. MiraLax 17 g daily.  3. Multivitamin one tablet daily.  4. Lasix 20 mg p.o. daily.  5. KCl 10 mEq p.o. daily.  6. Xanax 0.25 mg b.i.d.  7. Carafate 1 gram t.i.d.  8. Reglan 5 mg t.i.d.  9. Iron sulfate 325 mg  b.i.d.  10.AcipHex 20 mg daily.  11.Enteric-coated aspirin 81 mg daily.  12.Tylenol 500 mg q.6 h. p.r.n.  13.Zyrtec 5 mg p.o. daily.   DIET:  Low sodium, heart healthy.   ACTIVITY:  To include activities slowly.   DISCHARGE CONDITION:  Stable.   PHYSICAL EXAMINATION:  GENERAL:  Today she is an elderly lady not in any  acute distress.  VITAL SIGNS:  Temperature 98, pulse 80, respiratory rate 16, blood  pressure 125/71.  Mildly pale, not jaundiced.  No clubbing or cyanosis.  Atraumatic,  normocephalic. She is hard of hearing. Extraocular muscles intact.  Oropharynx and nasopharynx clear.  Heart sounds 1, 2, and 3 with loud 4/6 systolic murmur at the apex.  ABDOMEN:  Soft, nontender.  No organomegaly.  Bowel sounds are  normoactive.  She is alert and oriented x3.  Peripheral pulses present.  No edema.   The labs showed WBC 5, hematocrit 34, platelet count 298.  Chemistries:  Glucose 89, sodium 125, potassium 4, chloride 101, bicarbonate 23, BUN   89, creatinine 0.8.   The chest x-ray shows mild hilar adenopathy at left upper lobe, 8 x 10  mm spiculated spot, possibly scar.  Patient needs a CT chest as  outpatient medication and diet compliance and need to follow up with  primary care physician, gastroenterology, and pulmonary consults as  outpatient explained to her and her daughter.  They verbalized  understanding.   Discharge greater than 30 minutes.      Herbie Saxon, MD  Electronically Signed     MIO/MEDQ  D:  04/10/2006  T:  04/10/2006  Job:  [id number]

## 2010-05-23 NOTE — Assessment & Plan Note (Signed)
[facility]                             PULMONARY OFFICE NOTE   NAME:Ann [name], Ann [name]                        MRN:          [id number]  DATE:04/27/2006                            DOB:          03-Apr-1918    CHIEF COMPLAINT:  Evaluate bronchitis and abnormal x-ray.   HISTORY OF PRESENT ILLNESS:  This is an 77 year old white female who was  told she had bronchitis versus possible pneumonia.  She had an upper  respiratory tract infection in March.  She was initially treated with a  course of Zithromax and Mucinex, but then required hospitalization for  dehydration toward the end of March with tachycardia, shortness of  breath, cough productive of a thick yellow mucus, dyspnea, and extreme  weakness.  After a week's hospitalization, she was sent home for 1 day  only to return with a case of clostridium difficile colitis.  She was  hospitalized yet again until April 6, and then discharged home.  She is  slightly improved now.  She is having no further shortness of breath or  wheezing.  She still has slight acid reflux symptoms, but is now well  controlled on Aciphex.  When off Aciphex she does have increased cough.  The cough is dry at this time.  She has irregular heartbeats.  Has loss  of appetite, weight change has been increased, some abdominal pain, some  soreness in the throat.  No nasal congestion, sneezing, itching,  earache, anxiety, depression, hand or foot swelling is noted.  She is a  lifelong never smoker.  She was exposed to an uncle in her teenage years  who active tuberculosis.  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   PAST HISTORY:   MEDICAL:  History of heart failure with congestive heart failure,  history of heart dysrhythmias.  No history of asthma, emphysema,  diabetes, increased cholesterol, stroke, blood clots, cancer.  No  history of hypertension.   OPERATIVE  HISTORY:  A cholecystectomy in 2006.  Benign breast nodules  removed 40 years ago.   MEDICATION ALLERGIES:  1. SULFA.  2. CODEINE.  3. DARVON.   CURRENT MEDICATIONS:  1. Aciphex 20 mg daily.  2. Furosemide 20 mg daily.  3. Potassium 20 mEq daily.  4. Synthroid 100 mcg daily.  5. Zyrtec 5 mg daily.  6. Aspirin 81 mg daily.  7. Vitamins daily.  8. Carafate 3 daily.   FAMILY HISTORY:  Heart disease in the mother.  A sister had pancreatic  cancer.  Uncle had tuberculosis.   SOCIAL HISTORY:  Retired from [employer].  Does not smoke.  Currently is widowed.   REVIEW OF SYSTEMS:  Otherwise noncontributory.   PHYSICAL EXAMINATION:  This is an elderly female, in no distress.  Temperature 97.5.  Blood pressure 104/60.  Pulse 90.  Saturation 100%  room air.  CHEST:  Showed to be clear without evidence of active wheeze, rale or  rhonchi.  CARDIAC EXAM:  Showed a regular rate and rhythm without S3.  Normal  S1  and S2.  ABDOMEN:  Soft and non-tender.  EXTREMITIES:  Showed no edema, clubbing, or venous disease.  SKIN:  Was clear.  NEUROLOGIC EXAM:  Was intact.  HEENT EXAM:  Showed no jugular venous distention.  No lymphadenopathy.  Oropharynx clear.  NECK:  Supple.   LABORATORY DATA:  Chest x-ray was obtained and reviewed along with a CT  scan showing a small left apical scar that is stellate in nature.   IMPRESSION:  1. Reflux disease.  2. History of probable bronchitis.  3. Cyclic cough aggravated by reflux.  4. Left apical scar in a non-smoker.   RECOMMENDATIONS:  No pulmonary medication treatments are recommended.  We do recommend a CT scan of chest in 6 months.  We will order this and  review this ourselves.  No further pulmonary treatment is indicated.     [name], MD, [facility]  Electronically Signed    PEW/MedQ  DD: 04/27/2006  DT: 04/27/2006  Job #: [id number]   cc:   Lenon Curt. Chilton Si, M.D.

## 2010-05-23 NOTE — H&P (Signed)
Ann [name], [name]                 ACCOUNT NO.:  [id number]   MEDICAL RECORD NO.:  [id number]          PATIENT TYPE:  INP   LOCATION:  1825                         FACILITY:  [facility]   PHYSICIAN:  Elliot Cousin, M.D.    DATE OF BIRTH:  08-15-1918   DATE OF ADMISSION:  04/28/2006  DATE OF DISCHARGE:                              HISTORY & PHYSICAL   PRIMARY CARE PHYSICIAN:  Lenon Curt. Chilton Si, M.D.   PULMONOLOGIST:  Charlcie Cradle. Delford Field, MD, FCCP.   GASTROENTEROLOGIST:  Graylin Shiver, M.D.   CHIEF COMPLAINT:  Diarrhea, nausea and vomiting, generalized weakness.   HISTORY OF PRESENT ILLNESS:  The patient is an 77 year old woman with a  past medical history significant for esophageal dysmotility, acute  bronchitis, C. diff colitis, and congestive heart failure.  The patient  was hospitalized from April 04, 2006 through April 10, 2006 for  esophageal dysmotility and acute bronchitis.  She was rehospitalized on  April 12, 2006 and remained through April 19, 2006 for C. diff colitis.  The patient also complained of urinary retention during the  hospitalization.  She was recently evaluated by pulmonologist, Dr.  Delford Field, for an abnormal CT scan of the chest approximately three weeks  ago.  The patient apparently had hilar adenopathy and a stipulated  density in the left upper lobe of the lung.  The patient was evaluated  yesterday by gastroenterologist, Dr. Evette Cristal for recurrent diarrhea.  Per  Ann [name], Dr. Evette Cristal took a stool specimen.  The patient is unaware of  the results.   Over the past 2-3 days, the patient has had recurrent and persistent  diarrhea.  Over the past 24 hours, she has had at least 15-16 loose and  watery stools.  She denies bright red blood in her stools, and she  denies black, tarry stools.  She has had nausea and vomiting at least  four times over the past two days.  She denies coffee-ground emesis.  She has had mild-to-moderate abdominal cramping and bloating over the  lower abdomen without radiation.  She has had ongoing difficulty with  urination, although she admits that she has not been urinating very  much.  She denies outright discomfort and/or pain with urination.  She  has had generalized weakness and lightheadedness, particularly when she  stands.  She has had associated fever and chills subjectively.  She has  not taken many of her medications secondary to generalized ill-feeling.  She stopped Reglan because it makes me feel bad.  She completed the  course of Flagyl on April 21.   During the evaluation in the emergency department, the patient is mildly  febrile with a temperature of 100.3.  She is mildly tachycardic with a  heart rate ranging from 106 to 114 beats per minute.  Her blood pressure  is within normal limits.   Her lab data are significant for a white blood cell count of 27.8, total  bilirubin of 1.5, hemoglobin of 11.9, and an urinalysis that reveals  large leukocyte esterase, 7-10 WBCs, and many bacteria.  The patient  will be admitted  for further evaluation and management.   PAST MEDICAL HISTORY:  1. Hospitalization on April 12, 2006 through April 19, 2006 for C. diff      colitis and urinary retention.  2. Hospitalization from April 04, 2006 through April 10, 2006 for      esophageal dysmotility and acute bronchitis.  3. History of respiratory failure secondary to congestive heart      failure in 2004.  Her ejection fraction was 50-55% per 2D      echocardiogram then.  4. History of acute renal failure in the past.  5. Hypertension.  6. Hypothyroidism.  7. Peptic ulcer disease.  8. Hilar adenopathy and 8 x 10 mm spiculated density at the left upper      lobe of the lung.  The patient is being evaluated and followed by      pulmonologist, Dr. Delford Field.  9. Gastroesophageal reflux disease.  10.Decreased hearing acuity.  11.Anemia of chronic disease.  12.History of mitral valve regurgitation.  13.Vertigo.  14.Status post  appendectomy in the past.  15.Status post hysterectomy with unilateral oophorectomy in the past.  16.Status post benign breast mass excision in the 1950s.  17.Status post tonsillectomy and adenoidectomy.  18.Status post cataract repair of both eyes.  19.Status post left rotator cuff repair.  20.Status post dislocated right shoulder repair.  21.Status post cholecystectomy.   ALLERGIES:  The patient has allergies and/or intolerances to BIAXIN,  MERCAPTOPURINE, DARVON, CODEINE, and SULFA DRUGS.   MEDICATIONS:  1. Synthroid 100 mcg daily.  2. Multivitamins with iron once daily.  3. Lasix 20 mg daily.  4. Potassium chloride 20 mEq daily.  5. Flagyl (patient completed on April 26, 2006).  6. AcipHex 20 mg daily.  7. Carafate 1 gm one hour before each meal.  8. Reglan, discontinued.  9. Xanax 0.25 mg b.i.d. p.r.n.  10.Aspirin 81 mg daily.  11.Zyrtec 5 mg daily.  12.Symbicort 2 puffs b.i.d.   SOCIAL HISTORY:  The patient is widowed.  She has one daughter, Ann [name], who is also the patient's health care power of attorney.  The  patient lives alone.  She denies alcohol and tobacco use.   FAMILY HISTORY:  Her mother died of a stroke at 16 years of age.  Her  father died of complications of heart disease and diabetes mellitus.  She had one brother who died of a heart attack and another brother who  died from a suicide.  She had two sisters, one sister died of suicide,  and the other sister died of alcoholic pancreatitis.   REVIEW OF SYSTEMS:  As above in the history of present illness.   PHYSICAL EXAMINATION:  VITAL SIGNS:  Temperature 100.3, repeated at  98.3, blood pressure 126/64, pulse 114, repeated at 100.5, respiratory  rate 18, oxygen saturation 98% on room air.  GENERAL:  Patient is a pleasant, alert 77 year old Caucasian woman who  is currently lying in bed in no acute distress.  She does appear ill.  HEENT:  Head is normocephalic and atraumatic.  Pupils are equal,  round and reactive to light.  Extraocular movements are intact.  Conjunctivae  are clear.  Sclerae are white.  Nasal mucosa is dry.  Oropharynx:  Mucous membranes are dry.  No posterior exudates or erythema.  NECK:  Supple.  No thyromegaly.  No adenopathy.  No bruits.  No JVD.  LUNGS:  Clear to auscultation bilaterally.  HEART:  S1 and S2 with a 1-2/6 systolic murmur.  ABDOMEN:  Hypoactive  bowel sounds.  Abdomen is obese and mildly tender  over the hypogastrium without masses, distention, rebound, or  hepatosplenomegaly.  RECTAL/GU:  Deferred.  EXTREMITIES:  Pedal pulses are palpable bilaterally.  No pretibial  edema.  No pedal edema.  NEUROLOGIC:  The patient is alert and oriented x3.  Cranial nerves II-  XII are intact with the exception of a decrease in hearing acuity.  Sensation is grossly intact.   ADMISSION LABORATORY:  Urinalysis:  Small bilirubin, 15 ketones, large  leukocyte esterase, many epithelial cells, WBCs 7-10, RBCs 0-2, bacteria  many.  Sodium 134, potassium 3.6, chloride 103, CO2 24, glucose 115, BUN  9, creatinine 0.82, calcium 8.8, total protein 5.8, albumin 3.1, AST 31,  total bilirubin 1.5.  WBC 27.8, absolute neutrophil count 25.9,  hemoglobin 11.9, platelets 427.   ASSESSMENT:  1. Recurrent diarrhea, probably secondary to persistent/recurrent      Clostridium difficile colitis.  2. Mild abdominal tenderness without rebound, guarding, or distention.      The CT scan of the patient's pelvis on April 13, 2006 revealed      features suggestive of a diffuse infectious or inflammatory pan      colitis.  3. Leukocytosis with a white blood cell count of 27.8.  The patient's      white blood cell count was 7.6 on April 18, 2006.  4. Fever of 100.3.  5. Pyuria with a history of antibiotic treatment approximately 3-4      weeks ago, including antibiotic treatment for acute infectious      bronchitis.  6. History of congestive heart failure.  Clinically, the patient  is      volume depleted.   PLAN:  1. The patient will be admitted for further evaluation and management.  2. The patient received approximately 600 cc of normal saline in the      emergency department.  Will continue IV fluid volume repletion with      normal saline with potassium chloride added.  3. Supportive care.  4. Clear-liquid diet, as tolerated, without any advancement for now.  5. Will try to minimize p.o. intake.  6. Will start empiric treatment with Flagyl intravenously and oral      vancomycin.  Will add Flora-Q once daily.  7. Phenergan p.r.n. and Zofran.  The patient does not want Reglan      because of generalized intolerance.  8. Consider gastroenterology consultation with Dr. Evette Cristal.  9. For further evaluation, will check stools for C. diff., O&P, fecal      WBCs, and routine culture.  10.Will also culture the urine.      Elliot Cousin, M.D.  Electronically Signed     DF/MEDQ  D:  04/29/2006  T:  04/29/2006  Job:  [id number]   cc:   Lenon Curt Chilton Si, M.D. [name], MD, [facility]  Graylin Shiver, M.D.

## 2010-05-23 NOTE — Discharge Summary (Signed)
Ann [name], Ann [name]                 ACCOUNT NO.:  [id number]   MEDICAL RECORD NO.:  [id number]          PATIENT TYPE:  INP   LOCATION:  6739                         FACILITY:  [facility]   PHYSICIAN:  Ellie Lunch, M.D.      DATE OF BIRTH:  03-Jun-1918   DATE OF ADMISSION:  04/28/2006  DATE OF DISCHARGE:                               DISCHARGE SUMMARY   PRIMARY CARE PHYSICIAN:  Dr. Murray Hodgkins.   CONSULTS OBTAINED:  1. Dr. Ewing Schlein with [facility].  2. Dr. Jenne Pane with Urology.   DISCHARGE DIAGNOSES:  1. Recurrent toxin-proven Clostridium difficile colitis.  2. Nausea, vomiting, and diarrhea, most likely secondary to      Clostridium difficile colitis.  3. Nonsustained ventricular tachycardia, most likely secondary to      electrolyte abnormalities from diarrhea.  4. Hypokalemia secondary to diarrhea.  5. Hypomagnesemia.   PAST MEDICAL HISTORY:  1. Hypothyroidism.  2. History of congestive heart failure with EF of 55%.  3. Hypertension.  4. Peptic ulcer disease.  5. Gastroesophageal reflux disease.  6. Decreased hearing acuity.  7. Anemia of chronic disease.  8. History of mitral valve regurgitation.  9. Hilar adenopathy and 8 to 10 mm spiculated density at the left      upper lobe of the lung being evaluated and followed by      pulmonologist, Dr. Delford Field.  10.Status post appendectomy.  11.Status post hysterectomy.  12.Status post benign breast mass excision in the 1950s.  13.Status post tonsillectomy and adenoidectomy.  14.Status post cataract repair of both eyes.  15.Status post left rotator cuff repair.  16.Status post dislocated right shoulder repair.  17.Status post cholecystectomy.   DISCHARGE MEDICATIONS:  To be determined on the day of discharge.   HOSPITALIZATION:  Recent hospitalization from April 7 to April 14 for C.  dif colitis and urinary retention.   FOLLOWUP:  Will be determined on the actual day of discharge.   ADMISSION HISTORY AND PHYSICAL:  The patient is a  very pleasant 77-year-  old female with a past medical history significant for a recent hospital  admission for C. dif colitis who came to the emergency room because of  recurrence of her diarrhea, nausea, vomiting, and abdominal pain.  She  recently saw her gastroenterologist, Dr. Evette Cristal, on the day of admission,  who took a stool specimen.  However, the patient continued to get worse  and subsequently came to the emergency room.   HOSPITAL COURSE:  1. C. dif colitis.  Note:  C. dif toxins were again found to be      positive.  The patient was started on p.o. vanc and IV Flagyl since      she was unable to keep anything down.  Her C. dif colitis started      responding to the antibiotics.  Her final regimen will be      determined on the day of discharge.  Also a GI consult was      obtained, and Dr. Ewing Schlein followed the patient in the hospital.      Fortunately the patient  did not need any sigmoidoscopy since she      started responding to medical treatment of vanc and Flagyl.  2. Leukocytosis.  Note:  The patient's initial white count was 28,000.      This is thought to be secondary to C. dif colitis.  It continued to      get better with antibiotics.  3. Hypokalemia and hypomagnesemia, most likely secondary to diarrhea.      These are appropriately repleted.  4. Nonsustained ventricular tachycardia, most likely secondary to      electrolyte abnormalities.  Note the patient had a 5-beat run of V-      tac.  This was thought to be because of low potassium and      magnesium.  Once they were repleted, the patient did not have any      further episodes.  The patient denied any chest pain, shortness of      breath, palpitations, or syncope.  5. History of congestive heart failure.  The patient's Lasix was kept      on hold for the majority of her hospital stay mainly because she      had borderline blood pressure and she continued to have volume loss      because of copious diarrhea.  She  continued to stay euvolemic, and      her rates were monitored daily and did not suffer from an      exacerbation of CHF from discontinuation of Lasix, which will be      resumed most likely on the day of discharge.      Ellie Lunch, M.D.  Electronically Signed     BP/MEDQ  D:  05/02/2006  T:  05/02/2006  Job:  [id number]

## 2010-05-23 NOTE — Discharge Summary (Signed)
[name], [name]                 ACCOUNT NO.:  [id number]   MEDICAL RECORD NO.:  [id number]          PATIENT TYPE:  INP   LOCATION:  2001                         FACILITY:  [facility]   PHYSICIAN:  Ann Savage, Ann [name]        DATE OF BIRTH:  07-Aug-1918   DATE OF ADMISSION:  05/10/2006  DATE OF DISCHARGE:  05/14/2006                               DISCHARGE SUMMARY   PRIMARY CARE PHYSICIAN:  Green. Lenon Curt Chilton Si, M.D.   PRIMARY CARDIOLOGIST:  Cassell Clement, M.D.   GASTROENTEROLOGIST:  Graylin Shiver, M.D.   CONSULTATIONS:  In the hospital, Dr. Patty Sermons saw the patient from  cardiology.   DISCHARGE DIAGNOSES:  1. Congestive cardiac failure with severe mitral regurgitation.  2. C. diff colitis.  3. Hypothyroidism.  4. Chronic anemia.   DISCHARGE MEDICATIONS:  1. Vancomycin 250 mg twice daily for 1 week and then once daily for 10      more days.  2. Synthroid 125 mcg once daily.  3. Multivitamin tablet daily.  4. Lasix 20 mg daily.  5. Lopressor 12.5 mg twice daily.  6. Potassium chloride 20 mEq daily.  7. Aspirin 81 mg daily.  8. Flora-Q 1 capsule daily.  9. Lisinopril 5 mg once daily.  10.Zyrtec 5 mg daily as needed.   HISTORY OF PRESENT ILLNESS:  For a full history and physical, see the  history and physical dictated by Dr. Eda Paschal on May 10, 2006.  In short,  Ann [name] is an 77 year old lady with a history of C diff colitis,  heart failure who was admitted with complaints of shortness of breath  for 3 days and increased bowel activity.  She was apparently in the  hospital a few weeks ago and was diagnosed with C.  Diff colitis, for  which she was put on a tapering dose of vancomycin for a month.  She is  admitted with a diagnosis of congestive cardiac failure and cardiology  was consulted.   PROCEDURE PERFORMED:  1. She had an x-ray - chest done on May 10, 2006, which showed left      pleural effusion, left lower lobe airspace disease.  2. She had an x-ray - chest done  on May 12, 2006, which showed improved      aeration of the left lung base, small bilateral pleural effusions,      left greater than right.   PROBLEM IST:  1. Congestive cardiac failure.  She was admitted with a diagnosis of      congestive cardiac failure.  She was started with IV diuretics and      she has significantly improved since then.  She had an      echocardiogram in April 10, 2006, which showed severe MR and showed      an ejection fraction of 50%.  Cardiology has fine tuned her      medications.  She has been started on ACE inhibitor while in the      hospital and she has been doing well.  Her breathing is now at her  baseline, and she is been cleared by cardiology for discharge home.  2. C diff colitis.  She had recently been diagnosed with C.  Diff and      has been put on a tapering dose of vancomycin for a month.  When      she was admitted, she did complain of increased movement of bowels,      and her stool was sent for C diff while she was in the hospital,      which has come back negative.  Now, her stools are back to normal.      She no longer has diarrhea.  We will continue her on the this      current regimen of her tapering dose of vancomycin.  She will need      to be on vancomycin for another two more weeks.  She was also      started on Flora-Q while in the hospital which I recommended to her      to be continued.  3. Hypothyroidism.  On admission, her TSH was elevated a 12.17 and      then her Synthroid was increased to 125 mcg daily.  Her elevated      TSH might be likely to her C diff colitis with her increased bowel      motility and decreased absorption of the Synthroid.  At this time      what I would recommend is to check a TSH again in 6 weeks to see if      it normalizes.  We may be able to bring down the dose of Synthroid      back to her previous dose once her diarrhea is completely resolved.   DISPOSITION:  She is now being discharged home in  stable condition.   1. She is asked to follow-up with Dr. Patty Sermons, the cardiologist, in      2 weeks.  2. She has an appointment to see her gastroenterologist in 10 days.  3. She will get home health for physical therapy by [facility].      Ann Savage, Ann [name]  Electronically Signed     PKN/MEDQ  D:  05/14/2006  T:  05/14/2006  Job:  [id number]   cc:   Lenon Curt. Chilton Si, M.D.

## 2010-05-23 NOTE — H&P (Signed)
Ann [name], URAM                 ACCOUNT NO.:  [id number]   MEDICAL RECORD NO.:  [id number]          PATIENT TYPE:  INP   LOCATION:  1829                         FACILITY:  [facility]   PHYSICIAN:  Michaelyn Barter, M.D. DATE OF BIRTH:  1918-07-03   DATE OF ADMISSION:  04/04/2006  DATE OF DISCHARGE:                              HISTORY & PHYSICAL   PRIMARY CARE PHYSICIAN:  Dr. Murray Hodgkins.   CHIEF COMPLAINT:  Cough and weakness.   HISTORY OF PRESENT ILLNESS:  Ms. Marts is an 77 year old female with a  past medical history of congestive heart failure and hypothyroidism who  is accompanied by her daughter Ms. Lolita Cram.  Ms. Lajean Silvius gives the  history.  She states that the patient has suffered from bronchitis for  at least 3 weeks.  She has been listless, weak, and has not had any  relief over the past 3 weeks.  On March 17, 2006, her heart felt as if  it were racing.  She went to see her primary care Dr. Hyacinth Meeker on March 18, 2006.  The patient was taking Zyrtec previously, this was  discontinued.  However, the patient still did not feel right.  On March 21, 2006, she went to [facility] Emergency Department to be evaluated.  A chest x-ray there showed clearness.  The patient was told that she had  bronchitis and was started on a Z-Pak.  She took the medications  however, she still did not get any better.  On March 30, 2006, the  patient went back to Dr. Rondel Baton office.  Her albuterol was stopped.  The patient was asked to follow up on April 01, 2006.  A repeat chest x-  ray was done.  The patient has since become progressively more weak.  This morning the patient became so weak that she could barely talk.  She  is also complaining of a headache, positive leg cramping, some slight  blood streaks in her stool.  She states that March 21, 2006 she had a  low-grade fever.  She denies chills.  No nausea, no vomiting.  Scant  production with her cough.  No sick contacts at home.   ALLERGIES:  1. BIAXIN.  2. CODEINE.  3. DARVON.  4. SEPTRA.  5. IBUPROFEN.  6. ASPIRIN.  7. MERCAPTOPURINE.   CURRENT MEDICATIONS:  1. Aciphex 20 mg p.o. daily.  2. Furosemide 20 mg p.o. daily.  3. Multivitamin.  4. Potassium K-Dur 20 mEq p.o. daily.  5. Synthroid 100 mg p.o. daily.  6. Zyrtec 10 mg half a tablet p.o. daily.  7. Aspirin 81 mg p.o. daily.  8. Tylenol 500 mg.  9. Tums.   PAST MEDICAL HISTORY:  1. Respiratory failure secondary to congestive heart failure back in      April 2004.  2. Acute renal insufficiency.  3. Hypertension.  4. Hypothyroidism.  5. Peptic ulcer disease.  6. Hiatal hernia.  7. GERD.  8. Questionable osteoporosis.  9. Allergic rhinitis.  10.Vertigo.   PAST SURGICAL HISTORY:  1. Appendectomy.  2. Hysterectomy with one oophorectomy.  3.  A benign breast mass removed in the 1950s.  4. Tonsillectomy.  5. Adenoidectomy.  6. Cataract repair both eyes.  7. Laser surgery of the eye.  8. Left rotator cuff repair.  9. Dislocated right shoulder repair.  10.Excision of __________ from scalp.  11.Cholecystectomy.   FAMILY HISTORY:  The patient had one brother who died from MI, another  brother died from suicide.  The patient has two sisters - one sister  died from suicide, the other sister died from alcoholic pancreatitis.   SOCIAL HISTORY:  Cigarettes: The patient denies.  Alcohol: The patient  denies.   REVIEW OF SYSTEMS:  As per HPI otherwise all other systems are negative.   PHYSICAL EXAMINATION:  GENERAL:  The patient is awake, cooperative, she  coughs multiple times.  She has no obvious respiratory distress.  VITALS:  Her temperature is 97.1, blood pressure initially was 188/94  but later dropped to 107/53, heart rate 79, respirations 18, O2  saturation 99%.  HEENT:  Normocephalic, atraumatic.  Anicteric.  Extraocular movements  are intact.  Pupils are equally reactive to light.  Oral mucosa is dry,  pink, no thrush, no exudate.   NECK:  No JVD, supple, shotty left-sided posterior cervical  lymphadenopathy, no thyromegaly.  CARDIAC:  Heart sounds are very distant, S1 and S2 present, regular rate  and rhythm.  RESPIRATORY:  No crackles or wheezes.  ABDOMEN:  Soft, nontender, nondistended.  Bowel sounds x4 quadrants are  present.  No masses are palpable.  The patient does complain of some  right lower quadrant tenderness to palpation.  EXTREMITIES:  No leg edema.  NEUROLOGICAL:  The patient is alert and oriented x3.  Cranial nerves II-  XII are intact.  MUSCULOSKELETAL:  5/5 upper and lower extremity strength.   LABORATORIES:  White blood cell count 7.2, hemoglobin 13.1, hematocrit  38.8, platelets 416.  CK, MB PLC 1.6.  Myoglobin, PLC 103.  Sodium 137,  potassium 4.1, chloride 104, CO2 25, BUN 14, creatinine 0.92, glucose  115, BNP is 448, bilirubin total 0.5, alk phos 108, SGOT 37, SGPT 21,  total protein 6.6, albumin 3.5, calcium 9.6.  CK, MB PLC 1.6.  Myoglobin, PLC 103.  Urinalysis: Nitrites are negative, leukocytes  small, WBCs 0-2.   ASSESSMENT AND PLAN:  1. Respiratory tract infection.  The patient has failed outpatient      therapy.  We will therefore start the patient on empiric IV      antibiotics.  We will provide the patient with oxygen and nebulized      breathing treatments.  2. Dehydration.  We will provide the patient with gentle IV fluid      hydration keeping in mind that the patient does have a history of      congestive heart failure.  3. Blood streak in stool.  The significance of this is questionable.      We will check the patient's stool for occult blood.  We will also      monitor the patient's H&Hs closely.  4. Headache.  We will treat on a p.r.n. basis.  5. Progressive weakness.  This may have been secondary to a      combination of respiratory tract infection as well as dehydration.     We will address both of these as already outlined.  6. History of congestive heart failure.   This currently appears to be      compensated.  We will monitor the patient's I's and O's closely.  May consider getting a 2-D echocardiogram.  7. History of hypothyroidism.  We will resume the patient's previously      prescribed Synthroid.  8. Gastrointestinal prophylaxis.  We will provide Protonix.      Michaelyn Barter, M.D.  Electronically Signed     OR/MEDQ  D:  04/04/2006  T:  04/04/2006  Job:  [id number]   cc:   Lenon Curt Chilton Si, M.D.

## 2010-05-23 NOTE — H&P (Signed)
[name], Ann [name]                 ACCOUNT NO.:  [id number]   MEDICAL RECORD NO.:  [id number]          PATIENT TYPE:  INP   LOCATION:  5021                         FACILITY:  [facility]   PHYSICIAN:  Ann [name], MDDATE OF BIRTH:  [birth year]   DATE OF ADMISSION:  04/12/2006  DATE OF DISCHARGE:                              HISTORY & PHYSICAL   CHIEF COMPLAINT:  Diarrhea and intractable vomiting.   HISTORY OF PRESENT ILLNESS:  This is an 77 year old lady who resides  independently and who was recently discharged from the hospital after a  course of antibiotics.  The patient was discharged on Saturday and on  Sunday evening started having diarrhea.  The patient went to see her  primary care physician on Monday and had suspected C. diff colitis and  was started on Flagyl.  The patient was unable to tolerate the  antibiotic and started having intractable vomiting.  The patient was  then brought to the emergency room and she was very weak and unable to  tolerate any oral intake.  The patient has had no fevers or chills.  She  has had some mild abdominal discomfort, but she is unable to expand  upon.  It is noted that the patient has been having persistent diarrhea  and intractable vomiting.   PAST MEDICAL HISTORY:  Significant for:  1. Esophageal dysmotility.  2. Bronchitis, resolved.  3. History of congestive heart failure.  4. History of hypothyroidism.  5. History of anxiety.  6. History of difficulty with hearing.  7. History of gastroesophageal reflux disease.  8. History of anemia of chronic disease.  9. History of mitral regurgitation.   SOCIAL HISTORY:  Patient resides independently.  She denies any alcohol,  tobacco or drug use.  She has a daughter, with whom she has good  support.   ALLERGIES:  ARE TO:  1. CODEINE.  2. DARVON.  3. SULFA.   CURRENT MEDICATIONS:  Include the following:  1. Aciphex 20 mg p.o. daily.  2. Klor-Con 20 mEq p.o. daily.  3. Aspirin 81  mg p.o. daily.  4. Lasix 20 mg p.o. daily.  5. Synthroid 100 mcg p.o. daily.  6. Multivitamin 1 tablet daily.  7. Symbicort 2 puffs p.o. b.i.d.  8. Zyrtec 10 mg p.o. daily.  9. Tussionex p.r.n.  10.The patient was just started on Flagyl 1 day ago, however, the      patient has not taken any doses.   REVIEW OF SYSTEMS:  All systems are negative, except as noted in the  HPI.   PRIMARY CARE PHYSICIAN:  Dr. Murray Hodgkins.   LABORATORY STUDIES:  Done in the emergency room show the following:  Sodium 133, potassium 3.3, chloride 102, carbon dioxide 26, BUN 16,  creatinine 0.97, calcium 8.8.  Hemograft shows a white blood cell count  of 18.2, hemoglobin of 13.4, hematocrit of 39.0, platelet count of 251.  An acute abdominal series is negative for any intraabdominal process.   PHYSICAL EXAMINATION:  GENERAL:  On my examination, the patient is  resting comfortably in bed with her daughter at bedside.  VITAL SIGNS:  Are as follows, blood pressure 116/60.  Heart rate 89.  Respiratory rate 16.  Temperature 98.9.  Saturations are 98% on room  air.  HEENT EXAMINATION:  She is normocephalic, atraumatic.  Pupils are  equally round and reactive to light.  Extraocular movements are intact.  Tympanic membranes are translucent bilaterally.  Good landmarks.  Oropharynx is mildly tacky.  There is no exudate, erythema or lesions  noted.  NECK EXAMINATION:  Trachea is midline.  No masses.  No thyromegaly.  No  JVD.  No carotid bruits.  RESPIRATORY EXAMINATION:  Patient has normal respiratory effort.  He has  got equal excursion bilaterally.  No wheezing or rhonchi noted.  CARDIOVASCULAR:  She has got a normal S1 and S2.  She has got a 2/6  systolic ejection murmur.  No heaves or thrills on palpation.  ABDOMINAL EXAMINATION:  Patient has hyperactive bowel sounds.  Abdomen  is soft, diffuse tenderness.  No masses.  No hepatosplenomegaly noted.  LYMPH NODES SURVEY:  She has  no cervical, axillary or  inguinal  lymphadenopathy noted.  MUSCULOSKELETAL:  There is no warmth, swelling or erythema around the  joints.  No spinal tenderness noted.  NEUROLOGICAL:  No focal neurological deficits are noted.  DTRs are 2+  bilaterally upper and lower extremities.  PSYCHIATRIC:  Patient is alert and oriented x3.  She is hard of hearing;  however, she does have good recent and remote recall and good insight  and cognition.   ASSESSMENT/PLAN:  This is a patient who presents with persistent  diarrhea and intractable vomiting.  Given the patient's recent history  of antibiotics, it is reasonable that the patient should be ruled out  for C. diff colitis.  Once stool sample has been sent, however, thus far  there are no results from it.  The patient will be empirically started  on Flagyl, given IV fluids and put back on her usual medications.  Stool  will be sent for C. diff x3 and the patient will be started on clear  liquids and diet advanced as tolerated to a regular diet.      Ann Harm, MD  Electronically Signed     MAM/MEDQ  D:  04/13/2006  T:  04/13/2006  Job:  [id number]   cc:   Lenon Curt. Chilton Si, M.D.
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
## 2010-05-23 NOTE — Consult Note (Signed)
NAMESPECIAL, RANES                 ACCOUNT NO.:  [id number]   MEDICAL RECORD NO.:  [id number]          PATIENT TYPE:  INP   LOCATION:  6739                         FACILITY:  [facility]   PHYSICIAN:  Petra Kuba, M.D.    DATE OF BIRTH:  Jan 13, 1918   DATE OF CONSULTATION:  04/29/2006  DATE OF DISCHARGE:                                 CONSULTATION   We were asked to see Ms. Penninger today by the IN [employer] [facility], Dr.  Sherrie Mustache, in consult for nausea, vomiting and diarrhea.   HISTORY OF PRESENT ILLNESS:  Ms. Letson is an 77 year old female who was  recently discharged from the hospital on April 15 after recovering from  a bout of C. difficile.  It is also noteworthy that in March 2008 she  was hospitalized for bronchitis.  She was reports that her symptoms  returned the day after she finished her Flagyl.  The symptoms started on  April 22 with 7-10 mucous stools per day and vomiting a yellow acid  material.  She reports that she did feel well from April 15 to  approximately April 21.  She also has a has a second problem.  She was  recently diagnosed with esophageal dysmotility.  On April 1 she had an  esophagram that showed marked dysmotility and patulous esophagus.  The  patient currently denies any melena, hematochezia, hematemesis.  She  states that she currently has no appetite and is suffering with urinary  urgency.  Her daughter, who is also her POA, reports her mother always  wipes back to front after bowel movements and is concerned that the C.  difficile could have spread to her urinary tract.  The patient, Ms.  Lineberry, was scheduled to see Dr. Sherron Monday of [facility] on  Friday of this week.   PAST MEDICAL HISTORY:  Her primary care doctor is Dr. Murray Hodgkins.  Her  gastroenterologist is Dr. Herbert Moors.  Her pulmonologist is Dr. Charlcie Cradle. Delford Field.   1. C. difficile in April 2008.  2. Bronchitis and esophageal dysmotility April 04, 2006, through April 10, 2006.  3. History of respiratory failure secondary to congestive heart      failure in 2004.  4. History of acute renal failure.  5. Hypertension.  6. Hypothyroidism.  7. Peptic ulcer disease.  8. Gastroesophageal reflux disease.  9. Hilar adenopathy, which may be secondary to tuberculosis exposure      many years ago.  10.Decreased hearing acuity.  11.Anemia of chronic disease.  12.Mitral valve regurgitation.  13.Vertigo.   SURGERIES:  1. Appendectomy.  2. Hysterectomy was unilateral oophorectomy.  3. Benign breast mass excision.  4. Tonsillectomy and adenoidectomy.  5. Cataract repair in both eyes.  6. Left rotator cuff repair.  7. Dislocated right shoulder repair.  8. Cholecystectomy.   CURRENT MEDICATIONS:  Include Synthroid, a multivitamin, Lasix,  potassium chloride, Flagyl, AcipHex, Carafate, Xanax, 81-mg aspirin,  Zyrtec, and Symbicort.   MEDICATION ALLERGIES:  1. BIAXIN.  2. MERCAPTOPURINE.  3. DARVON.  4. CODEINE.  5. SULFA DRUGS.  The patient is widowed.  She lives alone.  Her daughter, Lolita Cram,  is her power of attorney.  She denies any alcohol, tobacco or  recreational drug use.   FAMILY HISTORY:  Significant for no irritable bowel disease.  No colon  cancer.  She does have a sister that died with alcoholic pancreatitis.   PHYSICAL EXAMINATION:  CURRENT VITAL SIGNS:  Show an increasing  temperature of 101.5, pulse of 96, respirations 18, blood pressure is  99/46.  O2 saturations currently 93.  GENERAL:  The patient is alert and oriented.  She is in no apparent  distress.  She is hard of hearing.  HEART:  Has a tachycardic irregular rhythm.  LUNGS:  Sound clear.  ABDOMEN:  Has positive bowel sounds.  Abdomen is soft, nontender,  nondistended.  She describes which she calls discomfort in her abdomen.  GENITOURINARY:  She does have an amber/orange-colored urine in her Foley  catheter.   CURRENT LABORATORIES:  Show hemoglobin 11.3, hematocrit 33.4,  white  count 28.1 with platelets 398.  Chem-7 is within normal limits.  She  does have a UTI on urinalysis.  On April 8 she had a CT scan of her  abdomen done that showed pancolitis, diffuse mild wall thickening in her  colon.   Dr. Vida Rigger has seen and examined the patient and collected a  history.  his assessment is that:  1. She has had multiple medical problems.  2. This is a likely recurrence of Clostridium difficile.  3. He recommends a urology consult for her recurrent urinary tract      infection as well as urinary retention and current urgency.  4. He agrees with oral vancomycin 250 four times a day, as well as IV      Flagyl.  5. Clear liquid diet.  6. Agree with stool for C. difficile toxin.   Will follow with you.  Will evaluate esophageal dysmotility when C.  difficile calms down.  Stay on liquid diet.      Stephani Police, PA    ______________________________  Petra Kuba, M.D.    MLY/MEDQ  D:  04/29/2006  T:  04/29/2006  Job:  [id number]   cc:   Lenon Curt. Chilton Si, M.D.  Graylin Shiver, M.D.  Petra Kuba, M.D.  Elliot Cousin, M.D.

## 2010-05-23 NOTE — Discharge Summary (Signed)
Ann [name], Ann [name]                 ACCOUNT NO.:  [id number]   MEDICAL RECORD NO.:  [id number]          PATIENT TYPE:  INP   LOCATION:  5021                         FACILITY:  [facility]   PHYSICIAN:  Michaelyn Barter, M.D. DATE OF BIRTH:  Oct 28, 1918   DATE OF ADMISSION:  04/12/2006  DATE OF DISCHARGE:  04/19/2006                               DISCHARGE SUMMARY   FINAL DIAGNOSES:  1. Intractable diarrhea secondary to C. difficile.  2. Hypokalemia.  3. Anemia.  4. Difficulty urinating.   PROCEDURES:  1. Acute abdominal x-ray completed April 12, 2006.  2. CT scan of the abdomen and pelvis completed April 13, 2006.   HISTORY OF PRESENT ILLNESS:  Ann [name] is an 77 year old female who was  recently discharged from the hospital secondary to completing a course  of antibiotics.  She was just discharged on April 10, 2006 and complained  that she had developed an intractable course of diarrhea and returns  currently April 13, 2006, for evaluation of her diarrhea.  She states  that she went to see her primary care doctor on Monday, and there was  concern that she may have developed C. difficile colitis.  She was  started on Flagyl.  She developed vomiting and therefore was unable to  tolerate the Flagyl.  She complained of becoming weaker and decided to  come to the hospital for further evaluation.  For past medical history,  please see that dictated by Dr. Marthann Schiller.   HOSPITAL COURSE:  #1 - C. DIFFICILE COLITIS.  The patient had CT scan of  her abdomen and pelvis completed on April 13, 2006.  CT scan of the  abdomen revealed mild wall thickening, diffusely in the colon with  subtle pericolonic edema/inflammation.  An infectious or inflammatory  pancolitis should be considered.  CT scan of the pelvis revealed  findings suggestive of diffuse infectious or inflammatory pancolitis.  The patient's stool on April 12, 2006, was collected, and later came back  positive for C. difficile colitis.   The patient was placed on IV Flagyl  500 mg q.8 h over the course of her hospitalization.  During this  hospital course, the patient's diarrhea did decrease, and by the date of  discharge, she indicated that her stools were beginning to develop a  significant amount of bulk.   #2 - HYPOKALEMIA.  The patient did receive supplementation with  potassium secondary to this.   #3 - ANEMIA.  The patient's hemoglobin was noted to be slightly low.  On  April 14, 2006, her hemoglobin was 10.9 with a hematocrit of 31.7.  There  was no obvious source of bleeding.  Therefore, a conservative approach,  i.e. monitoring, took place.  The patient's hemoglobin improved over the  course of her hospitalization, and by April 18, 2006, her hemoglobin had  improved to 12.1.   #4 - DIFFICULTY URINATING.  The patient indicated that she has had  problems voiding for some time, and she follows up with the Urologist.  She stated that she already had an appointment to see Urology.  She did  require p.r.n. straight catheterizations during the course of this  hospitalization.  By the day of discharge, the patient indicated that  she felt better, and she requested to be discharged.   VITAL SIGNS:  Temperature 97.1, heart rate 77, respirations 18, blood  pressure 97/57, O2 saturation 95% on room air.   LABORATORY DATA:  Sodium 134, potassium 3.8, chloride 108, CO2 21, BUN  5, creatinine 0.79, glucose 83, calcium 8.2, BNP 382.   DISCHARGE MEDICATIONS:  The patient was discharged home on:  1. Lasix 20 mg p.o. daily.  2. Synthroid 100 mcg p.o. daily.  3. Reglan 5 mg p.o. t.i.d.  4. Klor-Con 20 mEq p.o. daily.  5. Flagyl, she was told to continue her prior dose that was prescribed      prior to this admission.  6. Multivitamin one tablet daily.  7. Aciphex one tablet daily.  8. Carafate take one hour before meals.   FOLLOWUP:  She was told to call Dr. Frederik Pear for follow up appointment.      Michaelyn Barter, M.D.  Electronically Signed     OR/MEDQ  D:  05/13/2006  T:  05/13/2006  Job:  [id number]

## 2010-05-23 NOTE — Op Note (Signed)
NAMEZACHARY, LOVINS                 ACCOUNT NO.:  [id number]   MEDICAL RECORD NO.:  [id number]          PATIENT TYPE:  AMB   LOCATION:  DAY                          FACILITY:  [facility]   PHYSICIAN:  Lebron Conners, M.D.   DATE OF BIRTH:  1918-03-22   DATE OF PROCEDURE:  11/03/2004  DATE OF DISCHARGE:                                 OPERATIVE REPORT   PREOPERATIVE DIAGNOSIS:  Symptomatic gallstones.   POSTOPERATIVE DIAGNOSIS:  Symptomatic gallstones.   OPERATION:  Laparoscopic cholecystectomy.   SURGEON:  Lebron Conners, MD.   FIRST ASSISTANT:  Raechel Ache, MD.   ANESTHESIA:  General.   PROCEDURE:  Following induction of general anesthesia and routine  preparation and draping of the abdomen, I infiltrated local anesthetic just  inferior to the umbilicus and made a short transverse incision there and  incised the midline fascia for about 2 cm in the midline and then bluntly  entered the peritoneal cavity. I found no adhesions. I then placed a #0  Vicryl pursestring suture in the fascia and secured a Hassan cannula and  inflated the abdomen with carbon dioxide. Brief laparoscopy disclosed no  abnormalities except a fairly large somewhat tense gallbladder without signs  of acute inflammation. I anesthetized three additional port sites and placed  three additional laparoscopic ports in their usual locations under direct  view of the laparoscope noting no visceral injury as they were inserted.  With the patient positioned head-up, foot down, left tilted, I  grasped the  fundus of the gallbladder and held it upward and pulled the infundibulum of  the gallbladder laterally. There were no adhesions to the gallbladder. I  dissected the hepatoduodenal ligament until I clearly saw the cystic duct  and cystic artery. The cystic duct was in the typical location in the  lateral aspect of the hepatoduodenal ligament and could be seen clearly  emerging from the infundibulum of the gallbladder. I  clipped the cystic duct  with four clips and cut between the two closest to the gallbladder then  clipped the cystic artery with three clips and cut between the two closest  to the gallbladder. I then used the cautery to remove the gallbladder from  the liver and gained hemostasis with the cautery. I briefly irrigated the  area and removed the irrigant and saw that the clips were secure and that  the gallbladder fossa had good hemostasis. I then removed the gallbladder  from the abdomen through the umbilical incision and tied the pursestring  suture. I noted that the closure trapped no viscera. After removing the  remainder of the irrigant from the right upper quadrant, I removed the two  lateral ports under direct view and saw no bleeding from the abdominal wall.  After  allowing the carbon dioxide to escape, I removed the epigastric port. I  closed all skin incisions with intracuticular 4-0 Vicryl and Steri-Strips.  The patient was stable throughout the procedure. Blood loss was minimal.  Complications none.      Lebron Conners, M.D.  Electronically Signed     WB/MEDQ  D:  11/03/2004  T:  11/03/2004  Job:  [id number]   cc:   Lenon Curt. Chilton Si, M.D.  Fax: [phone]   Bertram Millard. Hyacinth Meeker, M.D.  Fax: [phone]

## 2010-05-23 NOTE — Assessment & Plan Note (Signed)
[facility]                             PULMONARY OFFICE NOTE   NAME:[name], Ann R                        MRN:          [id number]  DATE:10/11/2006                            DOB:          28-Oct-1918    [name]' CT scan was obtained today and showed no change in the  nodule that is in the left apex compared to April 2008.  The plan will  be to repeat scanning in 12 months.     [name], MD, [facility]  Electronically Signed    PEW/MedQ  DD: 10/14/2006  DT: 10/14/2006  Job #: [id number]   cc:   Lenon Curt. Chilton Si, M.D.

## 2010-05-23 NOTE — H&P (Signed)
NAMERAJA, CAPUTI                 ACCOUNT NO.:  [id number]   MEDICAL RECORD NO.:  [id number]          PATIENT TYPE:  INP   LOCATION:  1826                         FACILITY:  [facility]   PHYSICIAN:  Hind I Elsaid, MD      DATE OF BIRTH:  03/27/1918   DATE OF ADMISSION:  05/10/2006  DATE OF DISCHARGE:                              HISTORY & PHYSICAL   PRIMARY CARE PHYSICIAN:  Lenon Curt. Chilton Si, M.D.   GASTROENTEROLOGY:  Graylin Shiver, M.D. from [facility].   CHIEF COMPLAINT:  Shortness of breath for 3 days and increased bowel  movement and change in quality for three days.   HISTORY OF PRESENT ILLNESS:  This is an 77 year old female who was  recently discharged from the hospital with a diagnosis of C. difficile  colitis where the patient was discharged on vancomycin taper dose for  one month.  History of CHF, history of hypothyroidism.  Patient was  admitted before because of diarrhea and diagnosis of C. difficile  colitis.  Patient used to take Lasix 20 mg p.o. daily but stopped during  hospitalization due to dehydration and diarrhea but before 3 days prior  to the chart, Lasix was started and an x-ray was done which showed there  is no evidence of acute CHF.  Today, the patient here in the emergency  room complaining of 3 days of shortness of breath, paroxysmal nocturnal  dyspnea and orthopnea and mild exertion dyspnea.  She used to walk to  the bathroom without shortness of breath, today she complains of  shortness of breath on mild exertion. Since 2 days she started to sleep  almost sitting because of shortness of breath.  Patient denies any chest  pain, denies any cough, denies any fever.  Also condition associated  with increased bowel movement and change in quality to soft but patient  denies any watery diarrhea.  She denies any abdominal pain and denies  any nausea or vomiting.  Patient admitted that she has 6 to 8 soft bowel  movements per day associated with mucous.   She had a recent visit to her  gastroenterologist yesterday in his office where he ran a blood test  where he find that her white blood cells were normal and he did not do  any changes in the antibiotic treatment for C. difficile and he  recommended to continue with the treatment.  Also condition associate  with headache.  Patient denies any numbness or weakness of the  extremities.  Denies any burning in micturition.  Denies any loss of  consciousness.   PAST MEDICAL HISTORY:  1. C. difficile colitis, patient on vancomycin 250 mg tapered dose for      one month.  2. Congestive heart failure, most probably due to diastolic CHF with      ejection fraction of 50 to 55% which was recently done during last      hospitalization.  3. History of hypothyroidism.  4. Gastroesophageal reflux disease.  5. Hearing deficiency.  6. Anemia of chronic disease.  7. History of mitral valve regurgitation.  8. History of hilar adenopathy and 8-10 mm spiculated density at the      left upper lobe of the lung.  Being evaluated and followed by      Pulmonologist, Dr. Delford Field.  9. Status post appendectomy.  10.Status post hysterectomy.  11.Status post appendectomy.  12.Status post benign breast mass excision in 1950.  13.Status post tonsillectomy and adenoidectomy.  14.Status post cataract repair of both eyes.  15.Status post left rotator cuff repair.  16.Status post cholecystectomy.  17.Status post dislocated right shoulder repair.   MEDICATIONS:  1. AcipHex 20 mg p.o. daily.  2. K-Dur 20 mEq p.o. daily.  3. Aspirin 81 mg p.o. daily.  4. Lasix 20 mg p.o. daily.  5. Synthroid 100 mcg p.o. daily.  6. Multivitamin 1 tab p.o. daily.  7. Zyrtec 5 mg p.o. daily.  8. Vancomycin 250 mg p.o. daily.  9. Symbicort 2 puffs two times a day.  10.__________  one tab p.o. daily.  11.Xanax 0.5 mg p.o. b.i.d. p.r.n.  12.Carafate 1 g one hour before meals.   ALLERGIES:  PATIENT HAS ALLERGIES TO SULFA AND  INTOLERANCE TO CODEINE  AND DARVON AND BIAXIN.   REVIEW OF SYSTEMS:  As above in the history of present illness.   SOCIAL HISTORY:  She is a widow, she has one daughter, her name is [place]  __________ , she is the health care power of attorney.  She denies any  alcohol or tobacco abuse.  Discussion with the daughter, she would like  her to stay in nursing home with rehab for a while until she gets her  health.   FAMILY HISTORY:  Her mother died of stroke at 2.  Father died of heart  disease and diabetes mellitus.  She had one brother who died of heart  attack and another brother who died from suicide.   PHYSICAL EXAMINATION:  On examination in the emergency room:  Vital  signs:  Temperature 97.  Blood pressure 125/71.  Pulse rate fluctuating  from 100 to 95.  Respiratory rate 20.  Saturating 95% on room air.  GENERAL EXAMINATION:  She is a pleasant Caucasian female currently lying  on a 45 degree bed.  Not in respiratory distress or shortness of breath.  HEENT:  Normocephalic and atraumatic.  Pupils are round, equal and  reactive to light.  The extraocular movements within normal limits.  Sclera are white.  Nasal mucosa is dry.  Oropharynx mucous membrane are  moist.  NECK:  Supple, no thyromegaly.  There is some neck vein but no evidence  of JVD.  LUNGS:  There is diminished air entry especially on the left side of the  lung, there is mild crackles but there is no wheezing or rhonchi.  HEART:  Sounds S1, S2.  There is 2/6 systolic murmur at the apex and the  apex beat has a sixth intercostal space.  ABDOMEN:  Nondistended.  Nontender.  No masses.  Bowel sounds positive.  No distention, no rebound, no hepatomegaly.  RECTAL EXAM:  Deferred.  EXTREMITIES:  Pedal pulses are intact bilaterally.  No edema.  NEUROLOGICAL EXAM:  Patient is alert and oriented x3.  Cranial nerves II-  XII intact with decreased hearing.  Power is equal bilaterally in both lower and upper extremities.    LABORATORY DATA:  White blood cells 12, hemoglobin 11.9, hematocrit  35.1, platelets 514.  CK-MB 1.2 and troponin 0.005, sodium 137,  potassium 3.9, chloride 104, carbon dioxide 26.  BUN 9, creatinine 0.78  and glucose 102.  Calcium 9.2.  Liver function test within normal  limits. AST 26, ALT 15, total protein 6.2.  BNP was high 1055.  Chest x-  ray shows left side effusion and there is cardiomegaly.   ASSESSMENT:  1. Shortness of breath with a history of congestive heart failure with      a recent dose of Lasix due to dehydration during hospitalization.      Patient has a BNP of 1055 with symptoms, shortness of breath most      likely due to congestive heart failure.  Patient had a recent echo      done which showed an ejection fraction of 55 to 50.  We will admit      the patient to telemetry.  2. Input and output.  3. Restrict fluid intake and daily weight.  4. We will start Lasix 40 mg IV daily and we will do an EKG, run a CK      and troponin.  If there is any abnormality, we will consider echo      for evaluation of any wall motion abnormalities.  We will start the      patient on ACE inhibitors and a small dose of beta blockers as the      patient tolerates.   PROBLEM #2:  History of clostridium difficile colitis with increased  amount of bowel movement and change in the quality of bowel movement.  Rule out failure of treatment outpatient treatment with vancomycin but  abdomen looked completely soft.  Bowel movement is positive.  There is  no evidence of any fulminant colitis.  We will keep patient on contact  isolation.  I doubt we need CT abdomen and pelvis at this time.  We will  consider repeating stool for white blood cells and stool for C. diff.  Meanwhile we will continue with vancomycin treatment.  We will consider  gastroenterology consult if needed.  PROBLEM #3:  Hypothyroidism, to continue with Synthroid.  We will repeat  TSH and free T4.  PROBLEM #4:  Anemia with  hemoglobin of 11.9.  We will consider anemia  panel.  PROBLEM #5:  Deep vein thrombosis and gastrointestinal prophylaxis.      Hind Bosie Helper, MD  Electronically Signed     HIE/MEDQ  D:  05/10/2006  T:  05/10/2006  Job:  [id number]

## 2010-05-23 NOTE — Consult Note (Signed)
NAMENALY, SCHWANZ                 ACCOUNT NO.:  [id number]   MEDICAL RECORD NO.:  [id number]          PATIENT TYPE:  INP   LOCATION:  6739                         FACILITY:  [facility]   PHYSICIAN:  Heloise Purpura, MD      DATE OF BIRTH:  1918/04/21   DATE OF CONSULTATION:  04/29/2006  DATE OF DISCHARGE:                                 CONSULTATION   REQUESTING PHYSICIAN:  Elliot Cousin, M.D.   REASON FOR CONSULTATION:  Urinary tract infection.   HISTORY:  Ann [name] is an 77 year old female who has been seen in the  past by Cammy Copa, nurse practitioner, in our office for  urinary incontinence.  She was diagnosed with what appeared to be stress  incontinence and was recommended to begin pelvic floor muscle training.  Unfortunately, she has been in and out of the hospital recently and has  been found to have an infection with Clostridium difficile.  She was  admitted to the hospital for approximately 1 week at the end of March as  well as from April 7 to April 14.  She was most recently admitted on  April 23 with persistent diarrhea and nausea and vomiting.  During her  last hospitalization, she has been felt to have questionable urinary  retention and did have intermittent catheterization performed at one  point.  She was, however, discharged home voiding spontaneously.  She  states that she has had some dysuria recently but denies any fever,  flank pain or hematuria.   At baseline, Mrs. Pawley does have some mild incontinence associated  with coughing, sneezing and exercise.  She denies a history of other  voiding and storage urinary symptoms except for some mild urinary  urgency.  She again denies a history of urolithiasis, hematuria or GU  malignancy.  She does have a history of distant, infrequent episodes of  cystitis.   PAST MEDICAL HISTORY:  1. Hypertension.  2. Hypothyroidism.  3. History of congestive heart failure.  4. Gastroesophageal reflux disease.  5.  Restless leg syndrome.  6. Peptic ulcer disease  7. History of mitral valve regurgitation.   PAST SURGICAL HISTORY:  1. Appendectomy.  2. Hysterectomy.  3. Excision of benign breast mass.  4. Tonsillectomy.  5. Cataract surgery.  6. Left rotator cuff repair.  7. Right shoulder surgery.  8. Cholecystectomy.   MEDICATIONS:  Synthroid, multivitamins, Lasix, potassium chloride,  Flagyl, Aciphex, Carafate, Reglan, Xanax, aspirin, Zyrtec, Symbicort.   ALLERGIES:  1. BIAXIN.  2. MERCAPTOPURINE.  Her catheter.  3. CODEINE.  4. SULFA DRUGS.   FAMILY HISTORY:  There is no history of GU malignancy.   SOCIAL HISTORY:  The patient denies tobacco or alcohol use.   REVIEW OF SYSTEMS:  A complete review of systems was performed.  Pertinent positives included sinus problems and easy bruising.   PHYSICAL EXAMINATION:  VITAL SIGNS:  Temperature 100.0, pulse 97,  respirations 20, blood pressure 102/50.  CONSTITUTIONAL:  Somewhat lethargic female who is alert and oriented and  appropriate.  CARDIOVASCULAR:  Regular rate.  LUNGS:  Clear bilaterally.  ABDOMEN:  Soft,  nontender, nondistended without abdominal masses.  BACK:  No CVA tenderness.  GU:  The patient has an indwelling Foley catheter draining somewhat  concentrated urine.  EXTREMITIES:  No edema   LABORATORY DATA:  White blood count 28.1, hemoglobin 11.3.  Creatinine  0.73.  Urinalysis:  Many bacteria with many squamous epithelial cells, 7-  10 white blood cells and 0-2 red blood cells.  This was obtained via a  clean catch specimen before catheter placement.  Urine cultures  currently pending.   IMAGING:  The patient did undergo a CT scan which demonstrated no  hydronephrosis, renal masses, or stones.  The patient did have a  somewhat distended bladder on CT scan, although this is a fairly  nonspecific finding.   IMPRESSION/RECOMMENDATIONS:  1. Urinary tract infection:  I am somewhat skeptical that the patient      actually  has a urinary tract infection currently.  Her urinalysis      is most consistent with a contaminated specimen.  I have      recommended repeating the urinalysis for further evaluation prior      to any treatment.  2. Urinary incontinence and question of recent urinary retention:      Once the catheter is able to be removed from a medical standpoint,      I would recommend giving the patient a voiding trial and checking      post void residuals to ensure adequate emptying.  If the patient is      unable to empty her bladder adequately and has as post void      residuals greater than 300 mL, she may need intermittent      catheterization.  Otherwise, the patient should plan to follow up      as scheduled with Cammy Copa at her next appointment in May      as scheduled.   Thank you for this consultation.           ______________________________  Heloise Purpura, MD  Electronically Signed     LB/MEDQ  D:  04/29/2006  T:  04/30/2006  Job:  [id number]   cc:   Elliot Cousin, M.D.

## 2010-05-23 NOTE — H&P (Signed)
Ann [name], DOBRATZ                           ACCOUNT NO.:  [id number]   MEDICAL RECORD NO.:  [id number]                   PATIENT TYPE:  EMS   LOCATION:  ED                                   FACILITY:  [facility]   PHYSICIAN:  Rosanne Sack, M.D.         DATE OF BIRTH:  October 07, 1918   DATE OF ADMISSION:  05/03/2002  DATE OF DISCHARGE:                                HISTORY & PHYSICAL   CHIEF COMPLAINT:  Increased shortness of breath.   PROBLEM LIST:  1. New onset congestive heart failure with distant previous history     congestive heart failure.     a. Previous history of chest pain vaguely mentioned.     b. History congestive heart failure and pulmonary edema - December 2000.     c. A 2-D echocardiogram February 05, 2000 with ejection fraction reported        40%, positive pulmonary hypertension, left ventricular hypokinesis,        with full report unavailable.     d. Previous cardiologist  Dr. Jenne Campus with cardiac catheterization        December 2000 noting normal arteries and mitral regurgitation.  Full        report unavailable.  2. History peptic ulcer disease, hiatal hernia, gastroesophageal reflux     disease.     a. Modified barium swallow, esophogram December 2000 noted hiatal hernia        and gastroesophageal reflux disease.  Irritability of the cervical        esophagus noted.     b. Flexible sigmoidoscopy - Dr. Sabino Gasser - done in 2000; full report        unavailable but list diverticulosis as a diagnosis.  3. History hypothyroidism on Synthroid replacement.  4. Osteoporosis:  Status post bone density scan January 2000 - noted     osteopenia.  5. Allergic rhinitis.  6. Vertigo.  7. Surgical history includes appendectomy 1940s, oophorectomy 1940s, benign     breast mass removed 1950, tonsils and adenoidectomy, cataracts repaired     both eyes 1982, laser surgery to the eye 1988, left rotator cuff repair,     repair dislocated right shoulder.  8. Recent  excision melanoma from the scalp.   CONSULTS:  1. Dr. Veverly Fells. Arletha Grippe, ENT.  2. Dr. Lyn Henri, ophthalmology.  3. Dr. Marciano Sequin, audiology.  4. Dr. Humberto Leep. [place], orthopedics.  5. Dr. Georgiana Spinner, gastroenterology.  6. Dr. Norval Gable. [place], dermatology.  7. Dr. Darlin Priestly, cardiology.   ALLERGIES:  BIAXIN, CODEINE, DARVON, SEPTRA, IBUPROFEN, ASPIRIN, and  MERCAPTOPURINE.   CODE STATUS:  Currently Full Code.   HISTORY OF PRESENT ILLNESS:  This elderly 77 year old female is followed as  an outpatient by Dr. Murray Hodgkins of [facility].  She has a  distant history from 2000 of congestive heart failure and pulmonary edema.  No previous hospital  records in [facility] computer system.  I note no record of  admissions.  She presents today with an approximately 8 pound weight gain  over the past week.  She has noted increasing shortness of breath worsening  over the past 24 hours.  Over the past 24 hours there has been dyspnea with  exertion and orthopnea.  She came to the emergency room this a.m. and a  chest x-ray indicated congestive heart failure and pulmonary hypertension.  She has been seen previously by Dr. Jenne Campus, cardiology, and he did a  cardiac catheterization on her December 2000 noting normal coronary arteries  and mitral regurgitation though the full report is currently unavailable.   SOCIAL HISTORY:  The patient retired since 1984, worked previously for city  [employer].  Lives at home independently and marital status is married.  No alcohol or tobacco use.  Exercise/activity confined to yard work and  house work.   FAMILY HISTORY:  Had two brothers who died - one from MI and one from  suicide.  Had two sisters who both died - one from suicide and one from  alcoholic pancreatitis.  Has one daughter who accompanies the patient to the  emergency room today.   REVIEW OF SYSTEMS:  GENERAL:  An 8 pound weight gain.  Complaints of  increasing  shortness of breath.  Denies fever or recent infection.  EYES:  History of laser surgery to the eye and cataract repair bilaterally.  Wears  corrective lenses.  EARS:  Bilateral hearing aids utilized.  Hearing is  diminished.  NOSE:  Allergic rhinitis.  MOUTH/THROAT:  Unremarkable.  RESPIRATORY:  Dyspnea with exertion and orthopnea increasing over the  patient week.  The patient has known history of COPD.  Does not routinely  use nebulizers or oxygen.  CARDIAC:  No previous history of MI.  Does have a  distant history of congestive heart failure.  Also, pulmonary hypertension  and mitral valve regurgitation.  BREASTS:  Benign breast mass excision  distantly.  GI:  History reflux and hiatal hernia.  Previous sigmoidoscopy  indicated diverticulosis.  No current nausea or vomiting.  No change in  stooling pattern.  No vomiting or emesis.  No obvious GI bleeding.  GENITOURINARY:  Denies any symptomology consistent with urinary tract  infection.  MUSCULOSKELETAL:  Arthralgias and osteoporosis by history.  Bilateral shoulder repairs as indicated above.  SKIN:  Recent excision scalp  melanoma.  NEUROLOGIC:  No history of CVA, TIA, syncope.  No history of  memory loss or seizure.  PSYCH:  No reported history of anxiety or  depression.  ENDOCRINE:  Does have hypothyroidism; otherwise, no endocrine  disorders.  HEMATOLOGIC/LYMPHATIC:  No impaired hematologic/lymphatic, no  abnormal bleeding or bruising.  IMMUNOLOGIC:  No infections.   MEDICATIONS AT TIME OF ADMISSION:  1. Multivitamin daily.  2. Tums 500 mg twice daily.  3. Synthroid 0.1 mg daily.  4. Aciphex 20 mg daily.  5. Lasix 20 mg daily.  6. Potassium supplementation 20 mEq daily.  7. Tylenol Extra Strength two tablets as needed for pain.  8. Mycolog II cream as needed for fungal infection.  9. Zyrtec one-half tablet daily.  10.      Actonel 35 mg once weekly.  11.      Premarin cream to vaginal area daily.  PHYSICAL EXAMINATION:   VITAL SIGNS:  Temperature 96.8, pulse 98,  respirations 24, blood pressure 132/82.  O2 saturation 96%.  GENERAL:  Well-developed elderly female in no acute  distress.  She is alert,  oriented, cooperative with exam.  Somewhat hard of hearing.  HEENT:  Head:  Normocephalic.  Eyes:  Minimally reactive but equal pupils.  Red light reflex bilaterally.  Ears:  Canals are clear with hearing  diminished.  Nose:  No discharge or masses.  Oral mucosa pink and moist.  NECK:  Mild jugular venous distention.  No bruits noted.  HEART:  Rate and rhythm predominantly regular with occasional irregular  beats.  Per telemetry these appear to be PVC.  A mitral area murmur 2-3/6.  No sacral edema.  No significant extremity edema.  LUNGS:  Breath sounds reveal crackles bilaterally.  Some mild increased work  of breathing in the prone position, appears relieved when sitting up.  CHEST:  No axillary adenopathy.  BREAST:  Exam deferred as noncontributory.  ABDOMEN:  Soft and round with positive bowel sounds.  No pain or masses with  palpable.  GENITOURINARY:  No suprapubic area pain.  No CVA tenderness noted.  Genital  exam deferred as noncontributory.  RECTAL:  Deferred as noncontributory.  EXTREMITIES:  Able to move all extremities x4.  Scars to the shoulder area  from previous shoulder repair surgeries.  Strength appears equal and 5/5.  No indication of trauma.  NEUROLOGIC:  Cranial nerves II-XII grossly intact.  Deep tendon reflexes 2+.  Toes downgoing.  No unilateral or focal defects.  INTEGUMENTARY:  Skin turgor appears adequate.  No ulcers or lesions.  A  scalp excision melanoma has been done recently.   LABORATORY DATA:  A chest x-ray reveals pulmonary hypertension and  congestive heart failure.  A 12-lead EKG essentially normal sinus rhythm  with frequent PVCs; no obvious T wave changes.  CBC found wbc 8.6,  hemoglobin 12.6, hematocrit 37.2, platelets 251.  Metabolic panel:  Sodium  140, potassium  4.2, chloride 109, CO2 24, BUN 17, and creatinine 1.1.  I am  unsure of previous baseline BUN and creatinine.  Differential found  neutrophil high 80, with lymphocyte 14 and monocyte 6.  Liver function panel  tests per comprehensive metabolic panel were normal.  Arterial blood gas:  pH 7.456, PCO2 31.1, PO2 66.2, bicarb 21.4.  Cardiac enzymes:  CK 101, MB  2.2, relative index 2.2, troponin 0.01.   IMPRESSION AND PLAN:  1. Respiratory failure secondary to congestive heart failure.  Diurese as     below.  Support with nasal cannula oxygen.  Follow-up arterial blood gas     if indicated.  Follow-up chest x-ray in a.m.  2. Congestive heart failure with distant previous history.  Previous 2-D     echo and cardiac catheterizations indicated clean coronary arteries but     reduced ejection fraction 40% and left ventricular hypokinesis, unclear     location at this point.  Mitral regurgitation also noted, unclear    severity.  History of pulmonary hypertension.  Will diurese with 60 mg of     Lasix IV twice daily for the first 24 hours and then follow-up weights     and I&O closely.  Also follow up with basic metabolic panel to reassess     renal function and electrolytes.  Repeat chest x-ray in a.m.  Will admit     to telemetry bed, service Dr. Elliot Gurney, [phone].  Do full three     sets of cardiac enzymes.  Defer cardiac consult at this point.  Start ACE     inhibitor and follow blood pressures.  3. Mitral regurgitation per  previous echocardiogram.  Again, unclear     severity.  Will repeat 2-D echocardiogram.  I question if this is the     possible etiology of current congestive heart failure.  Her diet should     also be low sodium, low fat due to history of congestive heart failure.  4. Hypothyroidism.  On Synthroid replacement 0.1 mg daily.  Will follow     serum TSH and adjust medication dosage if indicated.  5. History of peptic ulcer disease.  Hemoglobin currently stable.  No  overt     signs of gastrointestinal bleed.  Does have history of gastroesophageal     reflux disease.  Will check stool for occult blood.  Follow hemoglobin     and hematocrit per repeat CBC.  Proton pump inhibitor for history of GERD     and hiatal hernia.  6. Code status.  Per bedside discussion the patient wishes Full Code     status.   DISPOSITION:  Telemetry bed, service Dr. Elliot Gurney, [phone].   DIAGNOSIS:  Congestive heart failure.   DIET:  Low sodium, low fat.   ACTIVITY:  Up as tolerated.   CONDITION:  Guarded.     Everett Graff, N.P.                     Rosanne Sack, M.D.    TC/MEDQ  D:  05/03/2002  T:  05/03/2002  Job:  [id number]

## 2010-06-06 ENCOUNTER — Encounter: Payer: Self-pay | Admitting: Cardiology

## 2010-06-06 ENCOUNTER — Encounter: Payer: Self-pay | Admitting: Nurse Practitioner

## 2010-06-06 ENCOUNTER — Ambulatory Visit (INDEPENDENT_AMBULATORY_CARE_PROVIDER_SITE_OTHER): Payer: Medicare Other | Admitting: Nurse Practitioner

## 2010-06-06 ENCOUNTER — Telehealth: Payer: Self-pay | Admitting: Cardiology

## 2010-06-06 VITALS — BP 120/70 | HR 88 | Ht 62.0 in | Wt 155.1 lb

## 2010-06-06 DIAGNOSIS — R002 Palpitations: Secondary | ICD-10-CM

## 2010-06-06 DIAGNOSIS — R42 Dizziness and giddiness: Secondary | ICD-10-CM | POA: Insufficient documentation

## 2010-06-06 NOTE — Assessment & Plan Note (Signed)
It sounds like she is having vertigo. I have asked her to get some Meclizine from the pharmacy and take it 3 times a day for the weekend. Hopefully she will be feeling better and will be able to make her trip next week. She is to rest over the weekend. She will call if she sees no improvement.

## 2010-06-06 NOTE — Patient Instructions (Addendum)
We will try you on Meclizine 25 mg three times a day for the next 3 days. You may take a whole tablet. This prescription is over the counter. Just ask the pharmacist to help you. Rest over the weekend Call on Monday if you are not a lot better.

## 2010-06-06 NOTE — Telephone Encounter (Signed)
Called in today wanting to know if Ann [name] could be worked in today. She has been having dizzy spells. Please call back.

## 2010-06-06 NOTE — Telephone Encounter (Signed)
Nurse called from [facility] re- Ann [name], she is dizzy ,right arm pain and nurse thinks she needs to be seen. Spoke with patient and Dr. Chilton Si not in so she will see [name] today.

## 2010-06-06 NOTE — Progress Notes (Signed)
Ann [name] Date of Birth: November 27, 1918   History of Present Illness: Ann [name] is seen today for a work in visit. She is seen for Dr. Patty Sermons. She called in with palpitations, but she is actually complaining of dizziness, especially with lying back. She feels like everything is spinning. She has had vertigo before and this is identical. She has some Meclizine on hand but did not take because it was outdated. No other symptoms are reported. She is planning on going on a bus trip to [place] on Tuesday and wants to feel better. She denies chest pain or shortness of breath. Dr. Thomasene Lot office is closed on Friday afternoons.   Current Outpatient Prescriptions on File Prior to Visit  Medication Sig Dispense Refill  . aspirin 81 MG tablet Take 81 mg by mouth daily.        . Calcium & Magnesium Carbonates (MYLANTA PO) Take by mouth as needed.        . calcium-vitamin D (OSCAL WITH D) 500-200 MG-UNIT per tablet Take 1 tablet by mouth daily.        . Cetirizine HCl (ZYRTEC PO) Take by mouth as needed.        . furosemide (LASIX) 20 MG tablet Take 20 mg by mouth daily.        . GuaiFENesin (MUCINEX PO) Take by mouth as needed.        Marland Kitchen levothyroxine (SYNTHROID) 100 MCG tablet Take 100 mcg by mouth daily.        Marland Kitchen losartan (COZAAR) 50 MG tablet Take 50 mg by mouth daily.        . metoprolol tartrate (LOPRESSOR) 25 MG tablet Take 25 mg by mouth daily. 1/2 daily        . multivitamin (THERAGRAN) per tablet Take 1 tablet by mouth daily.        . NYSTATIN PO Take by mouth as needed.        . Potassium (POTASSIMIN PO) Take by mouth.        . RABEprazole Sodium (ACIPHEX PO) Take by mouth daily.        Marland Kitchen SALINE NASAL SPRAY NA by Nasal route as needed.        . traMADol-acetaminophen (ULTRACET) 37.5-325 MG per tablet Take 1 tablet by mouth every 6 (six) hours as needed.        . triamcinolone (KENALOG) 0.1 % lotion Apply 1 application topically as needed.          Allergies  Allergen Reactions   . Ace Inhibitors Cough  . Codeine   . Darvon   . Mercurochrome (Merbromin (Mercurochrome))   . Sulfa Drugs Cross Reactors     Past Medical History  Diagnosis Date  . Mitral regurgitation   . Hypertension   . Palpitations   . Nocturnal dyspnea   . Advanced age   . Diastolic heart failure     Last echo in 5/11    History reviewed. No pertinent past surgical history.  History  Smoking status  . Never Smoker   Smokeless tobacco  . Never Used    History  Alcohol Use No    Family History  Problem Relation Age of Onset  . Stroke Mother   . Heart disease Father     Review of Systems: The review of systems is positive for dizziness. No palpitations. No chest pain.  She does have chronic back issues and has had a recent epidural. She has had some neck pain.  All other systems were reviewed and are negative.  Physical Exam: BP 120/70  Pulse 88  Ht 5\' 2"  (1.575 m)  Wt 155 lb 2 oz (70.364 kg)  BMI 28.37 kg/m2 Patient is very pleasant and in no acute distress. Skin is warm and dry. Color is normal.  HEENT is unremarkable. Normocephalic/atraumatic. PERRL. Sclera are nonicteric. Neck is supple. No masses. No JVD. Lungs are clear. Cardiac exam shows a regular rate and rhythm. Abdomen is soft. Extremities are without significant edema. Gait and ROM are intact. She is using a cane. No gross neurologic deficits noted.  LABORATORY DATA:  EKG shows sinus rhythm.   Assessment / Plan:

## 2010-07-05 ENCOUNTER — Emergency Department (HOSPITAL_COMMUNITY)
Admission: EM | Admit: 2010-07-05 | Discharge: 2010-07-05 | Disposition: A | Discharge status: Home / Self Care | Payer: Medicare Other | Attending: Emergency Medicine | Admitting: Emergency Medicine

## 2010-07-05 ENCOUNTER — Emergency Department (HOSPITAL_COMMUNITY): Payer: Medicare Other

## 2010-07-05 DIAGNOSIS — I509 Heart failure, unspecified: Secondary | ICD-10-CM | POA: Insufficient documentation

## 2010-07-05 DIAGNOSIS — R300 Dysuria: Secondary | ICD-10-CM | POA: Insufficient documentation

## 2010-07-05 DIAGNOSIS — K219 Gastro-esophageal reflux disease without esophagitis: Secondary | ICD-10-CM | POA: Insufficient documentation

## 2010-07-05 DIAGNOSIS — Z7982 Long term (current) use of aspirin: Secondary | ICD-10-CM | POA: Insufficient documentation

## 2010-07-05 DIAGNOSIS — Z79899 Other long term (current) drug therapy: Secondary | ICD-10-CM | POA: Insufficient documentation

## 2010-07-05 DIAGNOSIS — E039 Hypothyroidism, unspecified: Secondary | ICD-10-CM | POA: Insufficient documentation

## 2010-07-05 DIAGNOSIS — R630 Anorexia: Secondary | ICD-10-CM | POA: Insufficient documentation

## 2010-07-05 DIAGNOSIS — R112 Nausea with vomiting, unspecified: Secondary | ICD-10-CM | POA: Insufficient documentation

## 2010-07-05 DIAGNOSIS — N390 Urinary tract infection, site not specified: Secondary | ICD-10-CM | POA: Insufficient documentation

## 2010-07-05 LAB — URINALYSIS, ROUTINE W REFLEX MICROSCOPIC
Glucose, UA: NEGATIVE mg/dL
Nitrite: NEGATIVE
Protein, ur: NEGATIVE mg/dL

## 2010-07-05 LAB — DIFFERENTIAL
Basophils Absolute: 0 10*3/uL (ref 0.0–0.1)
Basophils Relative: 0 % (ref 0–1)
Eosinophils Absolute: 0 10*3/uL (ref 0.0–0.7)
Eosinophils Relative: 0 % (ref 0–5)
Monocytes Absolute: 0.3 10*3/uL (ref 0.1–1.0)

## 2010-07-05 LAB — COMPREHENSIVE METABOLIC PANEL
ALT: 13 U/L (ref 0–35)
BUN: 27 mg/dL — ABNORMAL HIGH (ref 6–23)
CO2: 25 mEq/L (ref 19–32)
Calcium: 9.1 mg/dL (ref 8.4–10.5)
Creatinine, Ser: 1.22 mg/dL — ABNORMAL HIGH (ref 0.50–1.10)
GFR calc Af Amer: 50 mL/min — ABNORMAL LOW (ref >60)
GFR calc non Af Amer: 42 mL/min — ABNORMAL LOW (ref >60)
Glucose, Bld: 102 mg/dL — ABNORMAL HIGH (ref 70–99)
Sodium: 135 mEq/L (ref 135–145)
Total Protein: 6.5 g/dL (ref 6.0–8.3)

## 2010-07-05 LAB — CK TOTAL AND CKMB (NOT AT ARMC)
Relative Index: INVALID (ref 0.0–2.5)
Total CK: 48 U/L (ref 7–177)

## 2010-07-05 LAB — CBC
MCHC: 34.7 g/dL (ref 30.0–36.0)
RDW: 13.6 % (ref 11.5–15.5)

## 2010-07-05 LAB — TROPONIN I: Troponin I: <0.3 ng/mL (ref <0.30)

## 2010-07-05 LAB — POCT I-STAT, CHEM 8
Calcium, Ion: 1.13 mmol/L (ref 1.12–1.32)
Creatinine, Ser: 1.4 mg/dL — ABNORMAL HIGH (ref 0.50–1.10)
Glucose, Bld: 99 mg/dL (ref 70–99)
Hemoglobin: 12.2 g/dL (ref 12.0–15.0)
Sodium: 137 mEq/L (ref 135–145)
TCO2: 25 mmol/L (ref 0–100)

## 2010-07-05 LAB — URINE MICROSCOPIC-ADD ON

## 2010-07-07 LAB — URINE CULTURE

## 2010-07-10 DIAGNOSIS — N390 Urinary tract infection, site not specified: Secondary | ICD-10-CM

## 2010-07-10 HISTORY — DX: Urinary tract infection, site not specified: N39.0

## 2010-07-24 ENCOUNTER — Ambulatory Visit
Admission: RE | Admit: 2010-07-24 | Discharge: 2010-07-24 | Disposition: A | Discharge status: Home / Self Care | Payer: Medicare Other | Source: Ambulatory Visit | Attending: Internal Medicine | Admitting: Internal Medicine

## 2010-07-24 ENCOUNTER — Other Ambulatory Visit: Payer: Self-pay | Admitting: Internal Medicine

## 2010-07-24 DIAGNOSIS — M62838 Other muscle spasm: Secondary | ICD-10-CM

## 2010-07-24 DIAGNOSIS — H8110 Benign paroxysmal vertigo, unspecified ear: Secondary | ICD-10-CM

## 2010-07-24 DIAGNOSIS — M542 Cervicalgia: Secondary | ICD-10-CM

## 2010-07-24 HISTORY — DX: Benign paroxysmal vertigo, unspecified ear: H81.10

## 2010-07-24 HISTORY — DX: Cervicalgia: M54.2

## 2010-07-31 DIAGNOSIS — G44209 Tension-type headache, unspecified, not intractable: Secondary | ICD-10-CM

## 2010-07-31 HISTORY — DX: Tension-type headache, unspecified, not intractable: G44.209

## 2010-08-14 ENCOUNTER — Other Ambulatory Visit: Payer: Self-pay | Admitting: Internal Medicine

## 2010-08-14 DIAGNOSIS — D492 Neoplasm of unspecified behavior of bone, soft tissue, and skin: Secondary | ICD-10-CM

## 2010-08-14 DIAGNOSIS — R21 Rash and other nonspecific skin eruption: Secondary | ICD-10-CM

## 2010-08-14 HISTORY — DX: Rash and other nonspecific skin eruption: R21

## 2010-08-17 ENCOUNTER — Ambulatory Visit
Admission: RE | Admit: 2010-08-17 | Discharge: 2010-08-17 | Disposition: A | Discharge status: Home / Self Care | Payer: Medicare Other | Source: Ambulatory Visit | Attending: Internal Medicine | Admitting: Internal Medicine

## 2010-08-17 DIAGNOSIS — D492 Neoplasm of unspecified behavior of bone, soft tissue, and skin: Secondary | ICD-10-CM

## 2010-08-21 ENCOUNTER — Encounter: Payer: Self-pay | Admitting: Cardiology

## 2010-08-21 ENCOUNTER — Ambulatory Visit (INDEPENDENT_AMBULATORY_CARE_PROVIDER_SITE_OTHER): Payer: Medicare Other | Admitting: Cardiology

## 2010-08-21 DIAGNOSIS — I119 Hypertensive heart disease without heart failure: Secondary | ICD-10-CM

## 2010-08-21 DIAGNOSIS — I059 Rheumatic mitral valve disease, unspecified: Secondary | ICD-10-CM

## 2010-08-21 DIAGNOSIS — I340 Nonrheumatic mitral (valve) insufficiency: Secondary | ICD-10-CM

## 2010-08-21 DIAGNOSIS — I5032 Chronic diastolic (congestive) heart failure: Secondary | ICD-10-CM

## 2010-08-21 DIAGNOSIS — I509 Heart failure, unspecified: Secondary | ICD-10-CM

## 2010-08-21 NOTE — Assessment & Plan Note (Signed)
The patient has not had any symptoms referable to her mitral regurgitation.  She has been remaining in normal sinus rhythm clinically.  No palpitations.

## 2010-08-21 NOTE — Progress Notes (Signed)
Ann [name] Date of Birth:  01-Nov-1918 [facility] / [facility] [address]   [address] [place], [place]  [address] [phone]           Fax   [phone]  History of Present Illness: This pleasant 77 year old woman resident of friend's home is seen for a scheduled 4 month followup office visit.  She has a history of known valvular heart disease with mitral regurgitation.  Her last echocardiogram was on 05/10/09 and showed normal left ventricular systolic function with an ejection fraction of 55-60% and showed impaired relaxation.  She also had mild to moderate left atrial enlargement and moderate mitral regurgitation and mild tricuspid regurgitation with normal pulmonary artery pressure.  The patient has a past history of essential hypertension.  She does not have any history of ischemic heart disease she was hospitalized in May of 2008 with congestive heart failure secondary to diastolic dysfunction she has had no recent symptoms of congestive heart failure.  The patient does have a history of benign positional vertigo.  She was recently given a prescription for meclizine which she uses on a p.r.n. Basis.  The patient has had some left posterior head and neck pain and had an MRI last week and she is waiting to hear for the results.  Current Outpatient Prescriptions  Medication Sig Dispense Refill  . aspirin 81 MG tablet Take 81 mg by mouth daily.        . Calcium & Magnesium Carbonates (MYLANTA PO) Take by mouth as needed.        . calcium-vitamin D (OSCAL WITH D) 500-200 MG-UNIT per tablet Take 1 tablet by mouth daily. Taking d3      . Cetirizine HCl (ZYRTEC PO) Take by mouth as needed.        . furosemide (LASIX) 20 MG tablet Take 20 mg by mouth daily.        . GuaiFENesin (MUCINEX PO) Take by mouth as needed. Taking vitamin d3 1000 dILY      . levothyroxine (SYNTHROID) 100 MCG tablet Take 100 mcg by mouth daily.        Marland Kitchen losartan (COZAAR) 50 MG tablet Take 50 mg by  mouth daily.        . metoprolol tartrate (LOPRESSOR) 25 MG tablet Take 25 mg by mouth daily. 1/2 daily        . multivitamin (THERAGRAN) per tablet Take 1 tablet by mouth daily.        . NYSTATIN PO Take by mouth as needed.        . RABEprazole Sodium (ACIPHEX PO) Take by mouth daily.        Marland Kitchen SALINE NASAL SPRAY NA by Nasal route as needed.        . traMADol-acetaminophen (ULTRACET) 37.5-325 MG per tablet Take 1 tablet by mouth every 6 (six) hours as needed.        . triamcinolone (KENALOG) 0.1 % lotion Apply 1 application topically as needed.        . Potassium (POTASSIMIN PO) Take 20 mEq by mouth daily.         Allergies  Allergen Reactions  . Ace Inhibitors Cough  . Codeine   . Darvon   . Mercurochrome (Merbromin (Mercurochrome))   . Sulfa Drugs Cross Reactors     Patient Active Problem List  Diagnoses  . Benign hypertensive heart disease without heart failure  . Mitral regurgitation  . Palpitations  . Chronic diastolic congestive heart failure  .  Hypothyroidism  . Spinal stenosis, lumbar  . Vertigo    History  Smoking status  . Never Smoker   Smokeless tobacco  . Never Used    History  Alcohol Use No    Family History  Problem Relation Age of Onset  . Stroke Mother   . Heart disease Father     Review of Systems: Constitutional: no fever chills diaphoresis or fatigue or change in weight.  Head and neck: no hearing loss, no epistaxis, no photophobia or visual disturbance. Respiratory: No cough, shortness of breath or wheezing. Cardiovascular: No chest pain peripheral edema, palpitations. Gastrointestinal: No abdominal distention, no abdominal pain, no change in bowel habits hematochezia or melena. Genitourinary: No dysuria, no frequency, no urgency, no nocturia. Musculoskeletal:No arthralgias, no back pain, no gait disturbance or myalgias. Neurological: No dizziness, no headaches, no numbness, no seizures, no syncope, no weakness, no tremors. Hematologic:  No lymphadenopathy, no easy bruising. Psychiatric: No confusion, no hallucinations, no sleep disturbance.    Physical Exam: Filed Vitals:   08/21/10 0959  BP: 128/64  Pulse: 66  The general appearance reveals a well-developed well-nourished woman in no distress.  She is complaining of left occipital and left posterior cervical neck pain.Pupils equal and reactive.   Extraocular Movements are full.  There is no scleral icterus.  The mouth and pharynx are normal.  The neck is supple.  The carotids reveal no bruits.  The jugular venous pressure is normal.  The thyroid is not enlarged.  There is no lymphadenopathy.  The chest is clear to percussion and auscultation. There are no rales or rhonchi. Expansion of the chest is symmetrical.    The heart reveals a grade 2/6 apical murmur of mitral regurgitation.The abdomen is soft and nontender. Bowel sounds are normal. The liver and spleen are not enlarged. There Are no abdominal masses. There are no bruits.    Normal extremity with good peripheral pulses and no phlebitis or edema.The skin is warm and dry.  There is no rash.  Strength is normal and symmetrical in all extremities.  There is no lateralizing weakness.  There are no sensory deficits.     Assessment / Plan:  Continue same medication.  Recheck in 4 months for followup office visit And EKG and after that consider followup echocardiogram.

## 2010-08-21 NOTE — Assessment & Plan Note (Signed)
The patient had a recent flareup of her systolic blood pressure when seen in the emergency room recently for a recurrent urinary tract infection with shaking chills.  Ordinarily her blood pressure has been in the normal range.  She's not having any syncopal episodes.  Her dizziness is suggestive of positional vertigo.

## 2010-08-21 NOTE — Assessment & Plan Note (Signed)
Patient has not been experiencing any recent recurrence of congestive heart failure symptoms.  No orthopnea or paroxysmal nocturnal dyspnea or pedal edema.

## 2010-12-10 ENCOUNTER — Ambulatory Visit (INDEPENDENT_AMBULATORY_CARE_PROVIDER_SITE_OTHER): Payer: Medicare Other | Admitting: Cardiology

## 2010-12-10 ENCOUNTER — Encounter: Payer: Self-pay | Admitting: Cardiology

## 2010-12-10 VITALS — BP 124/70 | HR 70 | Ht 63.0 in | Wt 153.0 lb

## 2010-12-10 DIAGNOSIS — R1013 Epigastric pain: Secondary | ICD-10-CM

## 2010-12-10 DIAGNOSIS — R002 Palpitations: Secondary | ICD-10-CM

## 2010-12-10 DIAGNOSIS — I340 Nonrheumatic mitral (valve) insufficiency: Secondary | ICD-10-CM

## 2010-12-10 DIAGNOSIS — E039 Hypothyroidism, unspecified: Secondary | ICD-10-CM

## 2010-12-10 DIAGNOSIS — I059 Rheumatic mitral valve disease, unspecified: Secondary | ICD-10-CM

## 2010-12-10 DIAGNOSIS — K3189 Other diseases of stomach and duodenum: Secondary | ICD-10-CM

## 2010-12-10 DIAGNOSIS — I119 Hypertensive heart disease without heart failure: Secondary | ICD-10-CM

## 2010-12-10 NOTE — Assessment & Plan Note (Signed)
Patient has not been aware of any recent palpitations.  Her EKG today shows normal sinus rhythm with no premature beats.

## 2010-12-10 NOTE — Patient Instructions (Signed)
Your physician recommends that you continue on your current medications as directed. Please refer to the Current Medication list given to you today. Your physician wants you to follow-up in: 4 months You will receive a reminder letter in the mail two months in advance. If you don't receive a letter, please call our office to schedule the follow-up appointment.  

## 2010-12-10 NOTE — Assessment & Plan Note (Signed)
No recurrent symptoms of congestive heart failure.

## 2010-12-10 NOTE — Assessment & Plan Note (Signed)
The patient is clinically euthyroid.  Dr. Neva Seat checks her blood work

## 2010-12-10 NOTE — Progress Notes (Signed)
Ann [name] Date of Birth:  06-25-1918 [facility] / [facility] [address]   [address] [place], [place]  [address] [phone]           Fax   [phone]  History of Present Illness: This pleasant 77 year old woman is a resident a friend's home.  She is seen for a scheduled four-month followup office visit.  She does have a history of mitral regurgitation.  She has a history of palpitations.  Her last echocardiogram in May 2011 showed normal left ventricular systolic function with an ejection fraction of 55-60% and impaired relaxation.  Patient does not have any history of ischemic heart disease.  She has had a past history of congestive heart failure secondary to diastolic dysfunction.  Last visit she's been doing well.  Current Outpatient Prescriptions  Medication Sig Dispense Refill  . aspirin 81 MG tablet Take 81 mg by mouth daily.        . Calcium & Magnesium Carbonates (MYLANTA PO) Take by mouth as needed.        . Cetirizine HCl (ZYRTEC PO) Take by mouth as needed.        . Cholecalciferol (VITAMIN D PO) Take by mouth. Taking 1000 daily       . furosemide (LASIX) 20 MG tablet Take 20 mg by mouth daily.        . GuaiFENesin (MUCINEX PO) Take by mouth as needed. Taking vitamin d3 1000 dILY      . levothyroxine (SYNTHROID) 100 MCG tablet Take 100 mcg by mouth daily.        Marland Kitchen losartan (COZAAR) 50 MG tablet Take 50 mg by mouth daily.        . metoprolol tartrate (LOPRESSOR) 25 MG tablet Take 25 mg by mouth daily. 1/2 daily        . multivitamin (THERAGRAN) per tablet Take 1 tablet by mouth daily.        . NYSTATIN PO Take by mouth as needed.        . Potassium (POTASSIMIN PO) Take 20 mEq by mouth daily.       . RABEprazole Sodium (ACIPHEX PO) Take 20 mg by mouth daily.       Marland Kitchen SALINE NASAL SPRAY NA by Nasal route as needed.        . traMADol-acetaminophen (ULTRACET) 37.5-325 MG per tablet Take 1 tablet by mouth every 6 (six) hours as needed.        .  triamcinolone (KENALOG) 0.1 % lotion Apply 1 application topically as needed.          Allergies  Allergen Reactions  . Ace Inhibitors Cough  . Codeine   . Darvon   . Mercurochrome (Merbromin (Mercurochrome))   . Sulfa Drugs Cross Reactors     Patient Active Problem List  Diagnoses  . Benign hypertensive heart disease without heart failure  . Mitral regurgitation  . Palpitations  . Chronic diastolic congestive heart failure  . Hypothyroidism  . Spinal stenosis, lumbar  . Vertigo    History  Smoking status  . Never Smoker   Smokeless tobacco  . Never Used    History  Alcohol Use No    Family History  Problem Relation Age of Onset  . Stroke Mother   . Heart disease Father     Review of Systems: Constitutional: no fever chills diaphoresis or fatigue or change in weight.  Head and neck: no hearing loss, no epistaxis, no photophobia or visual disturbance. Respiratory:  No cough, shortness of breath or wheezing. Cardiovascular: No chest pain peripheral edema, palpitations. Gastrointestinal: No abdominal distention, no abdominal pain, no change in bowel habits hematochezia or melena. Genitourinary: No dysuria, no frequency, no urgency, no nocturia. Musculoskeletal:No arthralgias, no back pain, no gait disturbance or myalgias. Neurological: No dizziness, no headaches, no numbness, no seizures, no syncope, no weakness, no tremors. Hematologic: No lymphadenopathy, no easy bruising. Psychiatric: No confusion, no hallucinations, no sleep disturbance.    Physical Exam: Filed Vitals:   12/10/10 1006  BP: 124/70  Pulse: 70   the general appearance reveals an elderly woman in no acute distress.The head and neck exam reveals pupils equal and reactive.  Extraocular movements are full.  There is no scleral icterus.  The mouth and pharynx are normal.  The neck is supple.  The carotids reveal no bruits.  The jugular venous pressure is normal.  The  thyroid is not enlarged.  There  is no lymphadenopathy.  The chest is clear to percussion and auscultation.  There are no rales or rhonchi.  Expansion of the chest is symmetrical.  The precordium is quiet.  The first heart sound is normal.  The second heart sound is physiologically split.  There is no gallop rub or click.  As a grade 2/6 holosystolic apical murmur of mitral regurgitation.  There is no abnormal lift or heave.  The abdomen is soft and nontender.  The bowel sounds are normal.  The liver and spleen are not enlarged.  There are no abdominal masses.  There are no abdominal bruits.  Extremities reveal good pedal pulses.  There is no phlebitis or edema.  There is no cyanosis or clubbing.  Strength is normal and symmetrical in all extremities.  There is no lateralizing weakness.  There are no sensory deficits.  The skin is warm and dry.  There is no rash.   EKG shows normal sinus rhythm and no ischemic changes  Assessment / Plan: Continue same medication and recheck in 4 months

## 2011-01-01 DIAGNOSIS — K648 Other hemorrhoids: Secondary | ICD-10-CM

## 2011-01-01 DIAGNOSIS — K5900 Constipation, unspecified: Secondary | ICD-10-CM

## 2011-01-01 HISTORY — DX: Constipation, unspecified: K59.00

## 2011-01-01 HISTORY — DX: Other hemorrhoids: K64.8

## 2011-01-08 ENCOUNTER — Other Ambulatory Visit: Payer: Self-pay | Admitting: *Deleted

## 2011-01-08 MED ORDER — METOPROLOL TARTRATE 25 MG PO TABS: 25.0000 mg | ORAL_TABLET | Freq: Every day | ORAL | Status: DC

## 2011-01-08 NOTE — Telephone Encounter (Signed)
Refilled metoprolol 

## 2011-01-09 ENCOUNTER — Telehealth: Payer: Self-pay | Admitting: Cardiology

## 2011-01-09 NOTE — Telephone Encounter (Signed)
New msg Pt wants to know about metoprolol prescription. She was returning your call

## 2011-01-09 NOTE — Telephone Encounter (Signed)
Rx for Metoprolol printed yesterday instead of going to [facility], called to them this morning

## 2011-02-25 ENCOUNTER — Encounter: Payer: Self-pay | Admitting: Cardiology

## 2011-04-22 ENCOUNTER — Ambulatory Visit (INDEPENDENT_AMBULATORY_CARE_PROVIDER_SITE_OTHER): Payer: Medicare Other | Admitting: Cardiology

## 2011-04-22 ENCOUNTER — Encounter: Payer: Self-pay | Admitting: Cardiology

## 2011-04-22 VITALS — BP 110/70 | HR 66 | Ht 63.0 in | Wt 152.0 lb

## 2011-04-22 DIAGNOSIS — I340 Nonrheumatic mitral (valve) insufficiency: Secondary | ICD-10-CM

## 2011-04-22 DIAGNOSIS — I119 Hypertensive heart disease without heart failure: Secondary | ICD-10-CM

## 2011-04-22 DIAGNOSIS — I059 Rheumatic mitral valve disease, unspecified: Secondary | ICD-10-CM

## 2011-04-22 DIAGNOSIS — R002 Palpitations: Secondary | ICD-10-CM

## 2011-04-22 NOTE — Assessment & Plan Note (Signed)
The patient denies any recent awareness of any palpitations or racing of her heart.  She's had no dizziness or syncope

## 2011-04-22 NOTE — Assessment & Plan Note (Signed)
Blood pressure has been remaining stable on current therapy. 

## 2011-04-22 NOTE — Assessment & Plan Note (Signed)
The patient has not had any exacerbation of CHF.  She's not having any increased exertional dyspnea, paroxysmal nocturnal dyspnea, or peripheral edema.

## 2011-04-22 NOTE — Patient Instructions (Signed)
Your physician recommends that you continue on your current medications as directed. Please refer to the Current Medication list given to you today.  Your physician wants you to follow-up in: 6 months. You will receive a reminder letter in the mail two months in advance. If you don't receive a letter, please call our office to schedule the follow-up appointment.  

## 2011-04-22 NOTE — Progress Notes (Signed)
Ann [name] Date of Birth:  02/27/1918 [facility] [address] [place], [place]  [address] [phone]         Fax   [phone]  History of Present Illness: This pleasant 78 year old woman is seen for a scheduled followup office visit.  She has a past history of palpitations and supraventricular tachycardia.  She also has a history of mitral regurgitation.  She had an echocardiogram in May 2011 which showed normal left ventricular systolic function with an ejection fraction of 55-60% and an impaired relaxation.  She has had a past history of congestive heart failure secondary to diastolic dysfunction.  She does not have any history of ischemic heart disease.  She has a history of arthritis and previously had been on tramadol which was recently stopped by her primary care physician because of worsening renal function.  Current Outpatient Prescriptions  Medication Sig Dispense Refill  . aspirin 81 MG tablet Take 81 mg by mouth daily.        . Calcium & Magnesium Carbonates (MYLANTA PO) Take by mouth as needed.        . Cetirizine HCl (ZYRTEC PO) Take by mouth as needed.        . Cholecalciferol (VITAMIN D PO) Take by mouth. Taking 1000 daily       . furosemide (LASIX) 20 MG tablet Take 20 mg by mouth daily.        . GuaiFENesin (MUCINEX PO) Take by mouth as needed. Taking vitamin d3 1000 dILY      . lansoprazole (PREVACID) 15 MG capsule Take 15 mg by mouth daily.      Marland Kitchen levothyroxine (SYNTHROID) 100 MCG tablet Take 100 mcg by mouth daily.        Marland Kitchen losartan (COZAAR) 50 MG tablet Take 50 mg by mouth daily.        . metoprolol tartrate (LOPRESSOR) 25 MG tablet Take 1 tablet (25 mg total) by mouth daily. 1/2 daily   30 tablet  11  . multivitamin (THERAGRAN) per tablet Take 1 tablet by mouth daily.        . NYSTATIN PO Take by mouth as needed.        . Potassium (POTASSIMIN PO) Take 20 mEq by mouth daily.       Marland Kitchen SALINE NASAL SPRAY NA by Nasal route as needed.          . triamcinolone (KENALOG) 0.1 % lotion Apply 1 application topically as needed.          Allergies  Allergen Reactions  . Ace Inhibitors Cough  . Codeine   . Darvon   . Mercurochrome (Merbromin (Mercurochrome))   . Sulfa Drugs Cross Reactors   . Tramadol     Abnormal kidney function    Patient Active Problem List  Diagnoses  . Benign hypertensive heart disease without heart failure  . Mitral regurgitation  . Palpitations  . Chronic diastolic congestive heart failure  . Hypothyroidism  . Spinal stenosis, lumbar  . Vertigo    History  Smoking status  . Never Smoker   Smokeless tobacco  . Never Used    History  Alcohol Use No    Family History  Problem Relation Age of Onset  . Stroke Mother   . Heart disease Father     Review of Systems: Constitutional: no fever chills diaphoresis or fatigue or change in weight.  Head and neck: no hearing loss, no epistaxis, no photophobia or visual disturbance. Respiratory:  No cough, shortness of breath or wheezing. Cardiovascular: No chest pain peripheral edema, palpitations. Gastrointestinal: No abdominal distention, no abdominal pain, no change in bowel habits hematochezia or melena. Genitourinary: No dysuria, no frequency, no urgency, no nocturia. Musculoskeletal:No arthralgias, no back pain, no gait disturbance or myalgias. Neurological: No dizziness, no headaches, no numbness, no seizures, no syncope, no weakness, no tremors. Hematologic: No lymphadenopathy, no easy bruising. Psychiatric: No confusion, no hallucinations, no sleep disturbance.    Physical Exam: Filed Vitals:   04/22/11 1059  BP: 110/70  Pulse: 66   the general appearance reveals a well-developed well-nourished alert elderly woman in no distress.Pupils equal and reactive.   Extraocular Movements are full.  There is no scleral icterus.  The mouth and pharynx are normal.  The neck is supple.  The carotids reveal no bruits.  The jugular venous pressure is  normal.  The thyroid is not enlarged.  There is no lymphadenopathy.  The chest is clear to percussion and auscultation. There are no rales or rhonchi. Expansion of the chest is symmetrical.  Heart reveals a soft apical systolic murmur.The abdomen is soft and nontender. Bowel sounds are normal. The liver and spleen are not enlarged. There Are no abdominal masses. There are no bruits.  The pedal pulses are good.  There is no phlebitis or edema.  There is no cyanosis or clubbing. Strength is normal and symmetrical in all extremities.  There is no lateralizing weakness.  There are no sensory deficits.  The skin is warm and dry.  There is no rash.    Assessment / Plan: Same medication.  Recheck in 6 months for office visit and EKG.  He is looking forward to going on a bus trip to [place] later this month with other residents of friend's home.  She is also participating in a prevent fall physical therapy course being given out of her retirement home.

## 2011-07-17 DIAGNOSIS — R059 Cough, unspecified: Secondary | ICD-10-CM

## 2011-07-17 HISTORY — DX: Cough, unspecified: R05.9

## 2011-09-08 ENCOUNTER — Telehealth: Payer: Self-pay | Admitting: Cardiology

## 2011-09-08 ENCOUNTER — Encounter: Payer: Self-pay | Admitting: Cardiology

## 2011-09-08 ENCOUNTER — Ambulatory Visit (INDEPENDENT_AMBULATORY_CARE_PROVIDER_SITE_OTHER): Payer: Medicare Other | Admitting: Cardiology

## 2011-09-08 VITALS — BP 110/66 | HR 74 | Ht 63.0 in | Wt 156.0 lb

## 2011-09-08 DIAGNOSIS — R42 Dizziness and giddiness: Secondary | ICD-10-CM

## 2011-09-08 DIAGNOSIS — I340 Nonrheumatic mitral (valve) insufficiency: Secondary | ICD-10-CM

## 2011-09-08 DIAGNOSIS — I4949 Other premature depolarization: Secondary | ICD-10-CM

## 2011-09-08 DIAGNOSIS — I059 Rheumatic mitral valve disease, unspecified: Secondary | ICD-10-CM

## 2011-09-08 DIAGNOSIS — I493 Ventricular premature depolarization: Secondary | ICD-10-CM | POA: Insufficient documentation

## 2011-09-08 DIAGNOSIS — R002 Palpitations: Secondary | ICD-10-CM

## 2011-09-08 NOTE — Telephone Encounter (Signed)
Pt dizzy, denies chest pain, SOB pls advise [phone]

## 2011-09-08 NOTE — Telephone Encounter (Signed)
Discussed with  Dr. Patty Sermons and ok to see today.  Advised patient of appointment time

## 2011-09-08 NOTE — Patient Instructions (Addendum)
Your physician has recommended that you wear an event monitor. Event monitors are medical devices that record the heart's electrical activity. Doctors most often [place] these monitors to diagnose arrhythmias. Arrhythmias are problems with the speed or rhythm of the heartbeat. The monitor is a small, portable device. You can wear one while you do your normal daily activities. This is usually used to diagnose what is causing palpitations/syncope (passing out).  Your physician recommends that you continue on your current medications as directed. Please refer to the Current Medication list given to you today.  Keep your November appointment

## 2011-09-08 NOTE — Telephone Encounter (Signed)
F/u  Pt was not able to see PCP Dr. Chilton Si today, would like to see Dr. Patty Sermons per previous med complaints.  Plz return call at hm#

## 2011-09-08 NOTE — Assessment & Plan Note (Signed)
She notes an occasional flutter but no sustained rapid palpitations

## 2011-09-08 NOTE — Telephone Encounter (Signed)
Called patient and spoke with [facility] nurse.  Patient started feeling dizzy on Friday and has use Meclizine daily and has not helped. Not sure if worse when moves or stands up.  blood pressure 138/76, heart rate 70, and respirations 22.  Feels like she is going to pass out at times. Denies any shortness of breath, chest pains, or any other symptoms except being dizzy and at times feeling like she is going to pass out.  Did advise nurse to call PCP Dr Chilton Si and if they think it is a heart related issue they will call us. Explained if didn't get anywhere with Dr Thomasene Lot office to call back

## 2011-09-08 NOTE — Assessment & Plan Note (Signed)
Her electrocardiogram today shows occasional PVC but otherwise normal sinus rhythm.  She states that she feels like her heart is stopping at times and that is when she feels like she is going to black out.  It does not happen every day.  We will have her return for a vent monitor to try to document whether her arrhythmia is causing her dizziness or not.

## 2011-09-08 NOTE — Progress Notes (Signed)
Ann [name] Date of Birth:  1918-03-16 [facility] [address] [place], [place]  [address] [phone]         Fax   [phone]  History of Present Illness: This pleasant 78 year old woman is seen for a work in office visit. She has a past history of palpitations and supraventricular tachycardia. She also has a history of mitral regurgitation. She had an echocardiogram in May 2011 which showed normal left ventricular systolic function with an ejection fraction of 55-60% and an impaired relaxation. She has had a past history of congestive heart failure secondary to diastolic dysfunction. She does not have any history of ischemic heart disease. She has a history of arthritis and previously had been on tramadol which was recently stopped by her primary care physician because of worsening renal function. She comes in today because she is concerned about some episodes that she has been experiencing.  4 days ago while walking in the hall at her retirement home she felt like she was going to black out and she felt like her heart had stopped.  She leaned up against the wall and Haldol and he did not actually have syncope.  She went and got was checked by the nurse at which point her blood pressure and pulse were satisfactory she has a history of dizzy spells which sometimes appear to be helped by meclizine or Dramamine.   Current Outpatient Prescriptions  Medication Sig Dispense Refill  . aspirin 81 MG tablet Take 81 mg by mouth daily.        . Calcium & Magnesium Carbonates (MYLANTA PO) Take by mouth as needed.        . Cetirizine HCl (ZYRTEC PO) Take by mouth as needed.        . Cholecalciferol (VITAMIN D PO) Take by mouth. Taking 1000 daily       . furosemide (LASIX) 20 MG tablet Take 20 mg by mouth daily.        . GuaiFENesin (MUCINEX PO) Take by mouth as needed. Taking vitamin d3 1000 dILY      . levothyroxine (SYNTHROID) 100 MCG tablet Take 100 mcg by mouth daily.         Marland Kitchen losartan (COZAAR) 50 MG tablet Take 50 mg by mouth daily.        . meclizine (ANTIVERT) 25 MG tablet Take 25 mg by mouth as directed.      . metoprolol tartrate (LOPRESSOR) 25 MG tablet Take 1 tablet (25 mg total) by mouth daily. 1/2 daily   30 tablet  11  . multivitamin (THERAGRAN) per tablet Take 1 tablet by mouth daily.        . NYSTATIN PO Take by mouth as needed.        . Potassium (POTASSIMIN PO) Take 20 mEq by mouth daily.       Marland Kitchen SALINE NASAL SPRAY NA by Nasal route as needed.        . triamcinolone (KENALOG) 0.1 % lotion Apply 1 application topically as needed.          Allergies  Allergen Reactions  . Ace Inhibitors Cough  . Codeine   . Darvon   . Mercurochrome (Merbromin (Mercurochrome))   . Pantoprazole     Dizzy  . Sulfa Drugs Cross Reactors   . Tramadol     Abnormal kidney function    Patient Active Problem List  Diagnosis  . Benign hypertensive heart disease without heart failure  . Mitral regurgitation  .  Palpitations  . Chronic diastolic congestive heart failure  . Hypothyroidism  . Spinal stenosis, lumbar  . Vertigo  . PVC's (premature ventricular contractions)  . Dizziness - light-headed    History  Smoking status  . Never Smoker   Smokeless tobacco  . Never Used    History  Alcohol Use No    Family History  Problem Relation Age of Onset  . Stroke Mother   . Heart disease Father     Review of Systems: Constitutional: no fever chills diaphoresis or fatigue or change in weight.  Head and neck: no hearing loss, no epistaxis, no photophobia or visual disturbance. Respiratory: No cough, shortness of breath or wheezing. Cardiovascular: No chest pain peripheral edema, palpitations. Gastrointestinal: No abdominal distention, no abdominal pain, no change in bowel habits hematochezia or melena. Genitourinary: No dysuria, no frequency, no urgency, no nocturia. Musculoskeletal:No arthralgias, no back pain, no gait disturbance or  myalgias. Neurological: No dizziness, no headaches, no numbness, no seizures, no syncope, no weakness, no tremors. Hematologic: No lymphadenopathy, no easy bruising. Psychiatric: No confusion, no hallucinations, no sleep disturbance.    Physical Exam: Filed Vitals:   09/08/11 1410  BP: 110/66  Pulse: 74   The general appearance reveals an elderly alert woman in no distress.The head and neck exam reveals pupils equal and reactive.  Extraocular movements are full.  There is no scleral icterus.  The mouth and pharynx are normal.  The neck is supple.  The carotids reveal no bruits.  The jugular venous pressure is normal.  The  thyroid is not enlarged.  There is no lymphadenopathy.  The chest is clear to percussion and auscultation.  There are no rales or rhonchi.  Expansion of the chest is symmetrical.  The precordium is quiet.  The first heart sound is normal.  The second heart sound is physiologically split.  There is no gallop rub or click.  There is a soft apical systolic murmur. There is no abnormal lift or heave.  The abdomen is soft and nontender.  The bowel sounds are normal.  The liver and spleen are not enlarged.  There are no abdominal masses.  There are no abdominal bruits.  Extremities reveal good pedal pulses.  There is no phlebitis or edema.  There is no cyanosis or clubbing.  Strength is normal and symmetrical in all extremities.  There is no lateralizing weakness.  There are no sensory deficits.  The skin is warm and dry.  There is no rash.  EKG today shows normal sinus rhythm with occasional PVC and shows left axis deviation and minimal voltage criteria for LVH  Assessment / Plan: We are placing a day and monitor her to evaluate for possible paroxysmal arrhythmia causing her dizziness and near syncopal episode.

## 2011-09-08 NOTE — Assessment & Plan Note (Signed)
She has not been experiencing any symptoms of worsening CHF

## 2011-10-02 DIAGNOSIS — Z79899 Other long term (current) drug therapy: Secondary | ICD-10-CM

## 2011-10-02 HISTORY — DX: Other long term (current) drug therapy: Z79.899

## 2011-10-23 ENCOUNTER — Telehealth: Payer: Self-pay | Admitting: *Deleted

## 2011-10-23 NOTE — Telephone Encounter (Signed)
No serious arrhythmia noted on event monitor per  Dr. Patty Sermons. Left message for patient to call back

## 2011-10-26 NOTE — Telephone Encounter (Signed)
Follow-up:    Patient returned your call.  Please call back after 1:30pm.

## 2011-10-26 NOTE — Telephone Encounter (Signed)
Follow-up:    Patient will be at her home until 4:40pm then she will be at dinner.  Please call back.

## 2011-10-26 NOTE — Telephone Encounter (Signed)
Left message to call back  

## 2011-10-27 NOTE — Telephone Encounter (Signed)
Advised patient of monitor results  

## 2011-10-27 NOTE — Telephone Encounter (Signed)
Left message to call back  

## 2011-10-28 ENCOUNTER — Telehealth: Payer: Self-pay | Admitting: Cardiology

## 2011-10-28 NOTE — Telephone Encounter (Signed)
Patient was returning call but had actually spoken with her yesterday about monitor results

## 2011-10-28 NOTE — Telephone Encounter (Signed)
New Problem: ° ° ° °Patient called in wanting to speak with you.  Please call back. °

## 2011-11-02 ENCOUNTER — Other Ambulatory Visit: Payer: Self-pay | Admitting: Family Medicine

## 2011-11-02 DIAGNOSIS — R609 Edema, unspecified: Secondary | ICD-10-CM

## 2011-11-03 ENCOUNTER — Ambulatory Visit
Admission: RE | Admit: 2011-11-03 | Discharge: 2011-11-03 | Disposition: A | Discharge status: Home / Self Care | Payer: Medicare Other | Source: Ambulatory Visit | Attending: Family Medicine | Admitting: Family Medicine

## 2011-11-03 DIAGNOSIS — R609 Edema, unspecified: Secondary | ICD-10-CM

## 2011-11-11 ENCOUNTER — Ambulatory Visit (INDEPENDENT_AMBULATORY_CARE_PROVIDER_SITE_OTHER): Payer: Medicare Other | Admitting: Cardiology

## 2011-11-11 ENCOUNTER — Encounter: Payer: Self-pay | Admitting: Cardiology

## 2011-11-11 VITALS — BP 118/68 | HR 67 | Wt 155.0 lb

## 2011-11-11 DIAGNOSIS — I509 Heart failure, unspecified: Secondary | ICD-10-CM

## 2011-11-11 DIAGNOSIS — I5032 Chronic diastolic (congestive) heart failure: Secondary | ICD-10-CM

## 2011-11-11 DIAGNOSIS — R002 Palpitations: Secondary | ICD-10-CM

## 2011-11-11 DIAGNOSIS — I059 Rheumatic mitral valve disease, unspecified: Secondary | ICD-10-CM

## 2011-11-11 DIAGNOSIS — I119 Hypertensive heart disease without heart failure: Secondary | ICD-10-CM

## 2011-11-11 DIAGNOSIS — I340 Nonrheumatic mitral (valve) insufficiency: Secondary | ICD-10-CM

## 2011-11-11 NOTE — Assessment & Plan Note (Signed)
The patient has not been expressing any symptoms of congestive heart failure or increased dyspnea

## 2011-11-11 NOTE — Patient Instructions (Addendum)
Your physician recommends that you continue on your current medications as directed. Please refer to the Current Medication list given to you today.  Your physician recommends that you schedule a follow-up appointment in: 4 MONTH OV 

## 2011-11-11 NOTE — Assessment & Plan Note (Signed)
She has had some intermittent worsening of her lower extremity edema usually if she sits for prolonged periods of time.  Also when she was wearing support hose it appeared to be counterproductive and she is doing better without them now.  Apparently they were acting like a tourniquet for her.  She does have varicose veins of her lower extremities bilaterally

## 2011-11-11 NOTE — Assessment & Plan Note (Signed)
Palpitations have been stable since last visit and her EKG today shows no premature beats

## 2011-11-11 NOTE — Progress Notes (Signed)
Ann [name] Date of Birth:  1918/10/29 [facility] [address] [place], [place]  [address] [phone]         Fax   [phone]  History of Present Illness: This pleasant 78 year old woman is seen for a scheduled followup office visit. She has a past history of palpitations and supraventricular tachycardia. She also has a history of mitral regurgitation. She had an echocardiogram in May 2011 which showed normal left ventricular systolic function with an ejection fraction of 55-60% and an impaired relaxation. She has had a past history of congestive heart failure secondary to diastolic dysfunction. She does not have any history of ischemic heart disease. She has a history of arthritis and previously had been on tramadol which was recently stopped by her primary care physician because of worsening renal function.  She was seen as a work in September complaining of palpitations and she wore a 30 day monitor from 09/09/11 until 10/08/11 and the monitor did not show any significant arrhythmias.  She was in normal sinus rhythm with occasional PVCs and PACs. The patient was seen recently in urgent care for right lower leg swelling and had a venous Doppler performed on 11/03/11 which was negative for DVT of the lower extremities.   Current Outpatient Prescriptions  Medication Sig Dispense Refill  . aspirin 81 MG tablet Take 81 mg by mouth daily.        . Calcium & Magnesium Carbonates (MYLANTA PO) Take by mouth as needed.        . Cetirizine HCl (ZYRTEC PO) Take by mouth as needed.        . Cholecalciferol (VITAMIN D PO) Take by mouth. Taking 1000 daily       . furosemide (LASIX) 20 MG tablet Take 20 mg by mouth daily.        . GuaiFENesin (MUCINEX PO) Take by mouth as needed. Taking vitamin d3 1000 dILY      . levothyroxine (SYNTHROID) 100 MCG tablet Take 100 mcg by mouth daily.        Marland Kitchen losartan (COZAAR) 50 MG tablet Take 50 mg by mouth daily.        . meclizine  (ANTIVERT) 25 MG tablet Take 25 mg by mouth as directed.      . metoprolol tartrate (LOPRESSOR) 25 MG tablet Take 1 tablet (25 mg total) by mouth daily. 1/2 daily   30 tablet  11  . multivitamin (THERAGRAN) per tablet Take 1 tablet by mouth daily.        . NYSTATIN PO Take by mouth as needed.        . pantoprazole (PROTONIX) 40 MG tablet Take 40 mg by mouth daily.      . Potassium (POTASSIMIN PO) Take 20 mEq by mouth daily.       Marland Kitchen SALINE NASAL SPRAY NA by Nasal route as needed.        . triamcinolone (KENALOG) 0.1 % lotion Apply 1 application topically as needed.          Allergies  Allergen Reactions  . Ace Inhibitors Cough  . Codeine   . Darvon   . Mercurochrome (Merbromin (Mercurochrome))   . Pantoprazole     Dizzy  . Sulfa Drugs Cross Reactors   . Tramadol     Abnormal kidney function    Patient Active Problem List  Diagnosis  . Benign hypertensive heart disease without heart failure  . Mitral regurgitation  . Palpitations  . Chronic diastolic congestive  heart failure  . Hypothyroidism  . Spinal stenosis, lumbar  . Vertigo  . PVC's (premature ventricular contractions)  . Dizziness - light-headed    History  Smoking status  . Never Smoker   Smokeless tobacco  . Never Used    History  Alcohol Use No    Family History  Problem Relation Age of Onset  . Stroke Mother   . Heart disease Father     Review of Systems: Constitutional: no fever chills diaphoresis or fatigue or change in weight.  Head and neck: no hearing loss, no epistaxis, no photophobia or visual disturbance. Respiratory: No cough, shortness of breath or wheezing. Cardiovascular: No chest pain peripheral edema, palpitations. Gastrointestinal: No abdominal distention, no abdominal pain, no change in bowel habits hematochezia or melena. Genitourinary: No dysuria, no frequency, no urgency, no nocturia. Musculoskeletal:No arthralgias, no back pain, no gait disturbance or myalgias. Neurological:  No dizziness, no headaches, no numbness, no seizures, no syncope, no weakness, no tremors. Hematologic: No lymphadenopathy, no easy bruising. Psychiatric: No confusion, no hallucinations, no sleep disturbance.    Physical Exam: Filed Vitals:   11/11/11 1047  BP: 118/68  Pulse: 67   the general appearance reveals a well-developed elderly woman in no distress.  The head and neck exam reveals pupils equal and reactive.  Extraocular movements are full.  There is no scleral icterus.  The mouth and pharynx are normal.  The neck is supple.  The carotids reveal no bruits.  The jugular venous pressure is normal.  The  thyroid is not enlarged.  There is no lymphadenopathy.  The chest is clear to percussion and auscultation.  There are no rales or rhonchi.  Expansion of the chest is symmetrical.  The precordium is quiet.  The first heart sound is normal.  The second heart sound is physiologically split.  There is a grade 2/6 holosystolic apical murmur of mitral regurgitation at the apex  There is no abnormal lift or heave.  The abdomen is soft and nontender.  The bowel sounds are normal.  The liver and spleen are not enlarged.  There are no abdominal masses.  There are no abdominal bruits.  Extremities reveal good pedal pulses.  There is trace edema bilaterally.  There is no cyanosis or clubbing.  Strength is normal and symmetrical in all extremities.  There is no lateralizing weakness.  There are no sensory deficits.  The skin is warm and dry.  There is no rash.   EKG shows normal sinus rhythm and nonspecific ST-T wave changes  Assessment / Plan:  Continue same medication.  Her most recent BUN was 41.  She does not need to go up any higher on her diuretic.  Recheck in 4 months for office visit.  Avoid excessive salt and try to avoid prolonged sitting with her legs down

## 2012-02-23 ENCOUNTER — Encounter: Payer: Self-pay | Admitting: Cardiology

## 2012-02-23 DIAGNOSIS — M25569 Pain in unspecified knee: Secondary | ICD-10-CM

## 2012-02-23 HISTORY — DX: Pain in unspecified knee: M25.569

## 2012-03-09 ENCOUNTER — Ambulatory Visit (INDEPENDENT_AMBULATORY_CARE_PROVIDER_SITE_OTHER): Payer: Medicare Other | Admitting: Cardiology

## 2012-03-09 ENCOUNTER — Encounter: Payer: Self-pay | Admitting: Cardiology

## 2012-03-09 ENCOUNTER — Other Ambulatory Visit: Payer: Self-pay | Admitting: *Deleted

## 2012-03-09 VITALS — BP 112/64 | HR 65 | Ht 62.0 in | Wt 163.0 lb

## 2012-03-09 DIAGNOSIS — I4949 Other premature depolarization: Secondary | ICD-10-CM

## 2012-03-09 DIAGNOSIS — I493 Ventricular premature depolarization: Secondary | ICD-10-CM

## 2012-03-09 DIAGNOSIS — I119 Hypertensive heart disease without heart failure: Secondary | ICD-10-CM

## 2012-03-09 DIAGNOSIS — I5032 Chronic diastolic (congestive) heart failure: Secondary | ICD-10-CM

## 2012-03-09 MED ORDER — METOPROLOL TARTRATE 25 MG PO TABS: 25.0000 mg | ORAL_TABLET | Freq: Every day | ORAL | Status: DC

## 2012-03-09 MED ORDER — LOSARTAN POTASSIUM 25 MG PO TABS: 25.0000 mg | ORAL_TABLET | Freq: Every day | ORAL | Status: DC

## 2012-03-09 NOTE — Assessment & Plan Note (Signed)
The patient has not been experiencing any orthopnea or paroxysmal nocturnal dyspnea or significant peripheral edema.  Her weight is up 7 pounds since last visit.  She appears to be euvolemic at this time.

## 2012-03-09 NOTE — Patient Instructions (Signed)
Your physician wants you to follow-up in: 4 months  You will receive a reminder letter in the mail two months in advance. If you don't receive a letter, please call our office to schedule the follow-up appointment.  Your physician has recommended you make the following change in your medication:  DECREASE LOSARTAN FROM 50 MG TO 25 MG DAILY. YOU MAY SPLIT THE PILLS IN HALF YOU CURRENTLY HAVE AT HOME TILL YOU GET NEW BOTTLE FROM PHARMACY THEN TAKE NEW LOWER DOSE ONE TABLET DAILY

## 2012-03-09 NOTE — Progress Notes (Signed)
Ann [name] Date of Birth:  [birth year] [facility] [address] [place], [place]  [address] [phone]         Fax   [phone]  History of Present Illness:  This pleasant 79 year old woman is seen for a scheduled followup office visit. She has a past history of palpitations and supraventricular tachycardia. She also has a history of mitral regurgitation. She had an echocardiogram in May 2011 which showed normal left ventricular systolic function with an ejection fraction of 55-60% and an impaired relaxation. She has had a past history of congestive heart failure secondary to diastolic dysfunction. She does not have any history of ischemic heart disease. She has a history of arthritis and previously had been on tramadol which was recently stopped by her primary care physician because of worsening renal function. She was seen as a work in September complaining of palpitations and she wore a 30 day monitor from 09/09/11 until 10/08/11 and the monitor did not show any significant arrhythmias. She was in normal sinus rhythm with occasional PVCs and PACs.  The patient was seen recently in urgent care for right lower leg swelling and had a venous Doppler performed on 11/03/11 which was negative for DVT of the lower extremities. She recently was seen by her orthopedist Dr. Otelia Sergeant for painful swelling of the right knee.  X-rays were done and the knee was aspirated and she was given a cortisone shot which has helped. The patient has a history of hypothyroidism and her Synthroid dose was recently reduced from 100 down to 75 mcg daily by her PCP. Current Outpatient Prescriptions  Medication Sig Dispense Refill  . aspirin 81 MG tablet Take 81 mg by mouth daily.        . Calcium & Magnesium Carbonates (MYLANTA PO) Take by mouth as needed.        . Cetirizine HCl (ZYRTEC PO) Take by mouth as needed.        . Cholecalciferol (VITAMIN D PO) Take by mouth. Taking 1000 daily       .  furosemide (LASIX) 20 MG tablet Take 20 mg by mouth daily.        . GuaiFENesin (MUCINEX PO) Take by mouth as needed. Taking vitamin d3 1000 dILY      . losartan (COZAAR) 25 MG tablet Take 1 tablet (25 mg total) by mouth daily.  30 tablet  5  . meclizine (ANTIVERT) 25 MG tablet Take 25 mg by mouth as directed.      . metoprolol tartrate (LOPRESSOR) 25 MG tablet Take 1 tablet (25 mg total) by mouth daily. 1/2 daily   30 tablet  11  . multivitamin (THERAGRAN) per tablet Take 1 tablet by mouth daily.        . NYSTATIN PO Take by mouth as needed.        . pantoprazole (PROTONIX) 40 MG tablet Take 40 mg by mouth daily.      . potassium chloride SA (K-DUR,KLOR-CON) 20 MEQ tablet Take one tablet daily      . SALINE NASAL SPRAY NA by Nasal route as needed.        . triamcinolone (KENALOG) 0.1 % lotion Apply 1 application topically as needed.         No current facility-administered medications for this visit.    Allergies  Allergen Reactions  . Ace Inhibitors Cough  . Codeine   . Darvon   . Mercurochrome (Merbromin (Mercurochrome))   . Pantoprazole  Dizzy  . Sulfa Drugs Cross Reactors   . Tramadol     Abnormal kidney function    Patient Active Problem List  Diagnosis  . Benign hypertensive heart disease without heart failure  . Mitral regurgitation  . Palpitations  . Chronic diastolic congestive heart failure  . Hypothyroidism  . Spinal stenosis, lumbar  . Vertigo  . PVC's (premature ventricular contractions)  . Dizziness - light-headed    History  Smoking status  . Never Smoker   Smokeless tobacco  . Never Used    History  Alcohol Use No    Family History  Problem Relation Age of Onset  . Stroke Mother   . Heart disease Father     Review of Systems: Constitutional: no fever chills diaphoresis or fatigue or change in weight.  Head and neck: no hearing loss, no epistaxis, no photophobia or visual disturbance. Respiratory: No cough, shortness of breath or  wheezing. Cardiovascular: No chest pain peripheral edema, palpitations. Gastrointestinal: No abdominal distention, no abdominal pain, no change in bowel habits hematochezia or melena. Genitourinary: No dysuria, no frequency, no urgency, no nocturia. Musculoskeletal:No arthralgias, no back pain, no gait disturbance or myalgias. Neurological: No dizziness, no headaches, no numbness, no seizures, no syncope, no weakness, no tremors. Hematologic: No lymphadenopathy, no easy bruising. Psychiatric: No confusion, no hallucinations, no sleep disturbance.    Physical Exam: Filed Vitals:   03/09/12 1024  BP: 112/64  Pulse: 65   the general appearance reveals an elderly woman who is alert and oriented.  She comes in to the office with her daughter.The head and neck exam reveals pupils equal and reactive.  Extraocular movements are full.  There is no scleral icterus.  The mouth and pharynx are normal.  The neck is supple.  The carotids reveal no bruits.  The jugular venous pressure is normal.  The  thyroid is not enlarged.  There is no lymphadenopathy.  The chest is clear to percussion and auscultation.  There are no rales or rhonchi.  Expansion of the chest is symmetrical.  The precordium is quiet.  The first heart sound is normal.  The second heart sound is physiologically split.  There is a soft apical systolic murmur.  There is no abnormal lift or heave.  The abdomen is soft and nontender.  The bowel sounds are normal.  The liver and spleen are not enlarged.  There are no abdominal masses.  There are no abdominal bruits.  Extremities reveal good pedal pulses.  The right knee has chronically swollen and deformed with overlying varicose veins. There is no phlebitis or edema.  There is no cyanosis or clubbing.  Strength is normal and symmetrical in all extremities.  There is no lateralizing weakness.  There are no sensory deficits.  The skin is warm and dry.  There is no rash.     Assessment /  Plan: Continue on same medication except reduce losartan and see if occasional dizziness improves.  Recheck in 4 months for followup office visit

## 2012-03-09 NOTE — Assessment & Plan Note (Signed)
The patient has not been aware of any recent palpitations or PVCs 

## 2012-03-09 NOTE — Assessment & Plan Note (Signed)
Her blood pressure has been running in the low normal range and she has been experiencing occasional dizziness.  She thinks that the losartan may be too strong for her.  We will try cutting back on her losartan down to 25 mg daily.

## 2012-04-08 ENCOUNTER — Encounter: Payer: Self-pay | Admitting: Cardiology

## 2012-05-04 ENCOUNTER — Other Ambulatory Visit: Payer: Self-pay | Admitting: Internal Medicine

## 2012-06-03 ENCOUNTER — Other Ambulatory Visit: Payer: Self-pay | Admitting: Internal Medicine

## 2012-06-07 ENCOUNTER — Encounter: Payer: Self-pay | Admitting: Cardiology

## 2012-06-07 LAB — CBC AND DIFFERENTIAL
HCT: 37 % (ref 36–46)
Hemoglobin: 12.7 g/dL (ref 12.0–16.0)
Platelets: 302 10*3/uL (ref 150–399)
WBC: 6.4 10^3/mL

## 2012-06-07 LAB — LIPID PANEL
HDL: 65 mg/dL (ref 35–70)
LDL Cholesterol: 89 mg/dL

## 2012-06-07 LAB — BASIC METABOLIC PANEL
BUN: 45 mg/dL — AB (ref 4–21)
Glucose: 92 mg/dL

## 2012-06-07 LAB — HEPATIC FUNCTION PANEL
ALT: 15 U/L (ref 7–35)
AST: 26 U/L (ref 13–35)
Alkaline Phosphatase: 126 U/L — AB (ref 25–125)
Bilirubin, Total: 0.6 mg/dL

## 2012-06-09 ENCOUNTER — Telehealth: Payer: Self-pay | Admitting: *Deleted

## 2012-06-09 NOTE — Telephone Encounter (Signed)
Informed patient regarding labwork from 06/07/2012 per Dr. Chilton Si were Normal

## 2012-06-16 ENCOUNTER — Non-Acute Institutional Stay: Payer: Medicare Other | Admitting: Internal Medicine

## 2012-06-16 VITALS — BP 124/64 | HR 68 | Temp 96.9°F | Resp 14 | Ht 63.0 in | Wt 168.0 lb

## 2012-06-16 DIAGNOSIS — M25561 Pain in right knee: Secondary | ICD-10-CM

## 2012-06-16 DIAGNOSIS — E039 Hypothyroidism, unspecified: Secondary | ICD-10-CM

## 2012-06-16 DIAGNOSIS — I509 Heart failure, unspecified: Secondary | ICD-10-CM

## 2012-06-16 DIAGNOSIS — M25569 Pain in unspecified knee: Secondary | ICD-10-CM

## 2012-06-16 MED ORDER — LEVOTHYROXINE SODIUM 88 MCG PO TABS: ORAL_TABLET | ORAL | Status: DC

## 2012-06-22 ENCOUNTER — Encounter: Payer: Self-pay | Admitting: Internal Medicine

## 2012-07-04 ENCOUNTER — Other Ambulatory Visit: Payer: Self-pay | Admitting: Internal Medicine

## 2012-07-12 ENCOUNTER — Ambulatory Visit (INDEPENDENT_AMBULATORY_CARE_PROVIDER_SITE_OTHER): Payer: Medicare Other | Admitting: Cardiology

## 2012-07-12 ENCOUNTER — Telehealth: Payer: Self-pay | Admitting: Cardiology

## 2012-07-12 ENCOUNTER — Encounter: Payer: Self-pay | Admitting: Cardiology

## 2012-07-12 VITALS — BP 132/80 | HR 64 | Ht 63.0 in | Wt 168.0 lb

## 2012-07-12 DIAGNOSIS — I493 Ventricular premature depolarization: Secondary | ICD-10-CM

## 2012-07-12 DIAGNOSIS — E039 Hypothyroidism, unspecified: Secondary | ICD-10-CM

## 2012-07-12 DIAGNOSIS — I4949 Other premature depolarization: Secondary | ICD-10-CM

## 2012-07-12 DIAGNOSIS — I340 Nonrheumatic mitral (valve) insufficiency: Secondary | ICD-10-CM

## 2012-07-12 DIAGNOSIS — I059 Rheumatic mitral valve disease, unspecified: Secondary | ICD-10-CM

## 2012-07-12 NOTE — Progress Notes (Signed)
Ann [name] Date of Birth:  June 25, 1918 [facility] [address] [place], [place]  [address] [phone]         Fax   [phone]  History of Present Illness: This pleasant 79 year old woman is seen for a scheduled followup office visit. She has a past history of palpitations and supraventricular tachycardia. She also has a history of mitral regurgitation. She had an echocardiogram in May 2011 which showed normal left ventricular systolic function with an ejection fraction of 55-60% and an impaired relaxation. She has had a past history of congestive heart failure secondary to diastolic dysfunction. She does not have any history of ischemic heart disease. She has a history of arthritis and previously had been on tramadol which was recently stopped by her primary care physician because of worsening renal function. She was seen as a work in September 2013 complaining of palpitations and she wore a 30 day monitor from 09/09/11 until 10/08/11 and the monitor did not show any significant arrhythmias. She was in normal sinus rhythm with occasional PVCs and PACs.  The patient was seen recently in urgent care for right lower leg swelling and had a venous Doppler performed on 11/03/11 which was negative for DVT of the lower extremities.  She recently was seen by her orthopedist Dr. Otelia Sergeant for painful swelling of the right knee.  She is wearing a brace on her right knee from Dr.Nitka.  She still wants to avoid surgery if possible.   Current Outpatient Prescriptions  Medication Sig Dispense Refill  . acetaminophen (TYLENOL) 500 MG tablet Take 500 mg by mouth as needed for pain (takes 2 per day or prn).      Marland Kitchen aspirin 81 MG tablet Take 81 mg by mouth daily.        . Calcium & Magnesium Carbonates (MYLANTA PO) Take by mouth as needed.        . Cetirizine HCl (ZYRTEC PO) Take by mouth as needed.        . Cholecalciferol (VITAMIN D PO) Take by mouth. Taking 1000 daily       .  furosemide (LASIX) 20 MG tablet TAKE 1 TABLET ONCE DAILY TO PREVENT EDEMA.  30 tablet  5  . GuaiFENesin (MUCINEX PO) Take by mouth as needed. Taking vitamin d3 1000 dILY      . levothyroxine (SYNTHROID, LEVOTHROID) 88 MCG tablet Take one tablet daily for thyroid supplement  30 tablet  11  . losartan (COZAAR) 25 MG tablet Take 1 tablet (25 mg total) by mouth daily.  30 tablet  5  . meclizine (ANTIVERT) 25 MG tablet Take 25 mg by mouth as directed.      . metoprolol tartrate (LOPRESSOR) 25 MG tablet Take 1 tablet (25 mg total) by mouth daily. 1/2 daily  30 tablet  5  . multivitamin (THERAGRAN) per tablet Take 1 tablet by mouth daily.        . NYSTATIN PO Take by mouth as needed.        . pantoprazole (PROTONIX) 40 MG tablet TAKE 1 TABLET DAILY FOR ACID REFLUX.  60 tablet  3  . potassium chloride SA (K-DUR,KLOR-CON) 20 MEQ tablet TAKE 1 TABLET EACH DAY.  30 tablet  3  . SALINE NASAL SPRAY NA by Nasal route as needed.        . triamcinolone (KENALOG) 0.1 % lotion Apply 1 application topically as needed.         No current facility-administered medications for this visit.  Allergies  Allergen Reactions  . Ace Inhibitors Cough  . Codeine   . Darvon   . Mercurochrome (Merbromin (Mercurochrome))   . Pantoprazole     Dizzy  . Sulfa Drugs Cross Reactors   . Tramadol     Abnormal kidney function    Patient Active Problem List   Diagnosis Date Noted  . Mitral regurgitation 04/17/2010    Priority: Medium  . Palpitations 04/17/2010    Priority: Medium  . Spinal stenosis, lumbar 04/17/2010    Priority: Medium  . PVC's (premature ventricular contractions) 09/08/2011  . Dizziness - light-headed 09/08/2011  . Vertigo 06/06/2010  . Benign hypertensive heart disease without heart failure 04/17/2010  . Chronic diastolic congestive heart failure 04/17/2010  . Hypothyroidism 04/17/2010    History  Smoking status  . Never Smoker   Smokeless tobacco  . Never Used    History  Alcohol Use  No    Family History  Problem Relation Age of Onset  . Stroke Mother   . Heart disease Father   . Diabetes Father   . Heart disease Brother     MI  . Alcohol abuse Sister     Review of Systems: Constitutional: no fever chills diaphoresis or fatigue or change in weight.  Head and neck: no hearing loss, no epistaxis, no photophobia or visual disturbance. Respiratory: No cough, shortness of breath or wheezing. Cardiovascular: No chest pain peripheral edema, palpitations. Gastrointestinal: No abdominal distention, no abdominal pain, no change in bowel habits hematochezia or melena. Genitourinary: No dysuria, no frequency, no urgency, no nocturia. Musculoskeletal:No arthralgias, no back pain, no gait disturbance or myalgias. Neurological: No dizziness, no headaches, no numbness, no seizures, no syncope, no weakness, no tremors. Hematologic: No lymphadenopathy, no easy bruising. Psychiatric: No confusion, no hallucinations, no sleep disturbance.    Physical Exam: Filed Vitals:   07/12/12 1108  BP: 132/80  Pulse: 64   the general appearance reveals a alert elderly woman in no distress.The head and neck exam reveals pupils equal and reactive.  Extraocular movements are full.  There is no scleral icterus.  The mouth and pharynx are normal.  The neck is supple.  The carotids reveal no bruits.  The jugular venous pressure is normal.  The  thyroid is not enlarged.  There is no lymphadenopathy.  The chest is clear to percussion and auscultation.  There are no rales or rhonchi.  Expansion of the chest is symmetrical.  The precordium is quiet.  The first heart sound is normal.  The second heart sound is physiologically split.  There is no  gallop rub or click.  There is a grade 2/6 holosystolic murmur mitral regurgitation at apex.  There is no abnormal lift or heave.  The abdomen is soft and nontender.  The bowel sounds are normal.  The liver and spleen are not enlarged.  There are no abdominal  masses.  There are no abdominal bruits.  Extremities reveal good pedal pulses.  There is no phlebitis or edema.  There is no cyanosis or clubbing.  Strength is normal and symmetrical in all extremities.  There is no lateralizing weakness.  There are no sensory deficits.  The skin is warm and dry.  There is no rash.     Assessment / Plan: Continue same medication.  Recheck in 4 months for office visit and EKG

## 2012-07-12 NOTE — Telephone Encounter (Signed)
Walk in pt Form " Labs" Dropped off gave to [facility] 07/09/12/KM

## 2012-07-12 NOTE — Assessment & Plan Note (Signed)
Her PCP recently increased her Synthroid dose in response to an elevated TSH level

## 2012-07-12 NOTE — Assessment & Plan Note (Signed)
Her heart rhythm is more regular today and PVCs are not noted

## 2012-07-12 NOTE — Assessment & Plan Note (Signed)
Murmur of mitral regurgitation is stable.  She has not been having any exacerbation of CHF symptoms.

## 2012-07-12 NOTE — Patient Instructions (Signed)
Your physician recommends that you continue on your current medications as directed. Please refer to the Current Medication list given to you today. Your physician wants you to follow-up in: 4 months You will receive a reminder letter in the mail two months in advance. If you don't receive a letter, please call our office to schedule the follow-up appointment.  

## 2012-08-08 ENCOUNTER — Encounter: Payer: Self-pay | Admitting: Internal Medicine

## 2012-08-08 DIAGNOSIS — M25561 Pain in right knee: Secondary | ICD-10-CM

## 2012-08-08 DIAGNOSIS — I509 Heart failure, unspecified: Secondary | ICD-10-CM | POA: Insufficient documentation

## 2012-08-08 DIAGNOSIS — N184 Chronic kidney disease, stage 4 (severe): Secondary | ICD-10-CM | POA: Insufficient documentation

## 2012-08-08 HISTORY — DX: Pain in right knee: M25.561

## 2012-08-08 NOTE — Progress Notes (Signed)
Patient ID: Ann [name], female   DOB: [birth year], 79 y.o.   MRN: [id number] [id number]: [id number] Name: Ann [name]  Sex: female Age: 79 y.o. DOB: 1918-12-29  [facility] #: [id number] Facility/Room: [facility] Level Of Care: Independent Provider: Kimber Relic Emergency Contacts: Extended Emergency Contact Information Primary Emergency Contact: Derry Skill [place] Home Phone: [phone] Relation: Daughter  Code Status: Full  Allergies: Ace inhibitors; Codeine; Darvon; Mercurochrome; Pantoprazole; Sulfa drugs cross reactors; and Tramadol  Chief Complaint  Patient presents with  . Medical Managment of Chronic Issues    Comprehensive exam  blood pressure, thyroid, CHF,     HPI: Patient is 79 y.o. female who presents for a complete exam and review of her medical problems.  Unspecified hypothyroidism : stable  Pain in right knee: chronic with some worsening in the lst month. Wearing a brace.  Congestive heart failure, unspecified: controlled  Chronic kidney disease, stage III (moderate): stable     Past Medical History  Diagnosis Date  . Mitral regurgitation   . Hypertension   . Palpitations   . Nocturnal dyspnea   . Advanced age   . Diastolic heart failure     Last echo in 5/11  . Pain in joint, lower leg 02/25/2012  . Other abnormal blood chemistry 02/25/2012  . Pain in joint, lower leg 02/23/2012  . Encounter for long-term (current) use of other medications 10/02/2011  . Cough 07/17/2011  . Internal hemorrhoids without mention of complication 01/01/2011  . Unspecified constipation 01/01/2011  . Rash and other nonspecific skin eruption 08/14/2010  . Tension headache 07/31/2010  . Benign paroxysmal positional vertigo 07/24/2010  . Cervicalgia 07/24/2010  . Urinary tract infection, site not specified 07/10/2010  . Abnormality of gait 05/09/2009  . Vitamin D deficiency 01/25/2009  . Lumbago 01/05/2008  . Cervicitis and endocervicitis 12/22/2007  . Actinic keratosis  12/22/2007  . Pain in limb 12/22/2007  . Muscle weakness (generalized) 08/04/2007  . Blepharochalasis 05/12/2007  . Insomnia, unspecified 05/12/2007  . Dermatophytosis of foot 07/14/2006  . Dizziness and giddiness 06/25/2006  . Allergic rhinitis due to pollen 04/21/2006  . Dyskinesia of esophagus 04/12/2006  . Other malaise and fatigue 04/01/2006  . Unspecified urinary incontinence 02/10/2006  . Cramp of limb 10/07/2005  . Restless legs syndrome (RLS) 06/30/2005  . Chronic kidney disease, stage II (mild) 01/02/2005  . Unspecified hypothyroidism 09/10/2004  . Diaphragmatic hernia without mention of obstruction or gangrene 10/03/2003  . Congestive heart failure, unspecified 01/05/2002  . Unspecified venous (peripheral) insufficiency 01/05/1998  . Reflux esophagitis 01/06/1996  . Peptic ulcer, unspecified site, unspecified as acute or chronic, without mention of hemorrhage, perforation, or obstruction 01/06/1996  . Diverticulosis of colon (without mention of hemorrhage) 01/06/1996  . Pain in right knee 08/08/2012    Past Surgical History  Procedure Laterality Date  . Tonsillectomy and adenoidectomy  1940  . Appendectomy  1940's  . Oophorectomy  1940's  . Breast surgery  1950's    benign tumors removed  . Cataract extraction w/ intraocular lens  implant, bilateral  1982  . Lesion excision  04/2002    melanomas of scalp  Dr. Park Liter  . Skin cancer excision  2005    BCC of right temple  . Cholecystectomy, laparoscopic  10/2004    Dr. Orson Slick  . Tooth extraction  06/2008    and bone implant  Dr. Reva Bores   PROCEDURES 1999 - Barium Swallow 01/28/1998 Bone Density  04/25/1998 Stress Test   04/25/1998  EKG  02/01/1998 Cat Scan  01/01/1999 Chest X-Ray  01/03/1999 Cardiac Catheterization  01/31/1999 Mammogram  06/10/1999 EKG  10/29/1999 Chest X-Ray  12/22/1999 Chest X-Ray  11/31/2002 Echocardiogram  06/22/2000 Carotid Doppler 11/24/2000 Pap Smear  03/29/2001 Mammogram  11/07/2001 EKG  11/07/2001  Chest X-Ray  11/23/3001 EKG 03/29/2002 Mammogram 11/27/2002 Chest X-Ray  09/08/2003 Chest X-Ray  09/08/2003 CT of the Head  06/04/2004 Mammogram  07/01/2005 Mammogram  03/18/2006 EKG  03/21/2006 Chest X-Ray 04/01/2006 Chest X-Ray  04/06/2006 Chest X-Ray (pneumonia) 04/06/2006 CT of the Head  04/06/2006 Swallow Study 04/13/2006 CT of the Abdomen  05/12/2006 Chest X-Ray  05/20/2006 Chest X-Ray  05/20/2006 EKG  12/1998 - Barium Swallow:  Hiatal Hernia, GERD, Irritability of the Cervical Esophagus 2000 - Flexible Sigmoidoscopy:  Diverticulosis (Dr. Virginia Rochester) 2000 - Heart Catheterization:  Mild Pulmonary HTN (Dr. Jenne Campus) 2004 - 2-D Echo:  LVEF 55%; Moderate MR 08/20/2008 Mammogram Normal   11/02/12 US venous of right leg: normal, no clot  CONSULTANTS Cardiology: Lucas Mallow Ophth: Harlin Heys Audiology; [facility] Ortho: Wyonia Hough GI: Virginia Rochester ENT: Arletha Grippe    Medication List       This list is accurate as of: 06/16/12 11:59 PM.  Always use your most recent med list.               aspirin 81 MG tablet  Take 81 mg by mouth daily.     furosemide 20 MG tablet  Commonly known as:  LASIX  TAKE 1 TABLET ONCE DAILY TO PREVENT EDEMA.     levothyroxine 88 MCG tablet  Commonly known as:  SYNTHROID, LEVOTHROID  Take one tablet daily for thyroid supplement     losartan 25 MG tablet  Commonly known as:  COZAAR  Take 1 tablet (25 mg total) by mouth daily.     meclizine 25 MG tablet  Commonly known as:  ANTIVERT  Take 25 mg by mouth as directed.     metoprolol tartrate 25 MG tablet  Commonly known as:  LOPRESSOR  Take 1 tablet (25 mg total) by mouth daily. 1/2 daily     MUCINEX PO  Take by mouth as needed. Taking vitamin d3 1000 dILY     multivitamin per tablet  Take 1 tablet by mouth daily.     MYLANTA PO  Take by mouth as needed.     NYSTATIN PO  Take by mouth as needed.     pantoprazole 40 MG tablet  Commonly known as:  PROTONIX  TAKE 1 TABLET DAILY FOR ACID REFLUX.     potassium  chloride SA 20 MEQ tablet  Commonly known as:  K-DUR,KLOR-CON  Take one tablet daily     SALINE NASAL SPRAY NA  by Nasal route as needed.     triamcinolone lotion 0.1 %  Commonly known as:  KENALOG  Apply 1 application topically as needed.     VITAMIN D PO  Take by mouth. Taking 1000 daily     ZYRTEC PO  Take by mouth as needed.        Meds ordered this encounter  Medications  . DISCONTD: levothyroxine (SYNTHROID, LEVOTHROID) 75 MCG tablet    Sig: Take one tablet daily for thyroid supplement  . levothyroxine (SYNTHROID, LEVOTHROID) 88 MCG tablet    Sig: Take one tablet daily for thyroid supplement    Dispense:  30 tablet    Refill:  11    Immunization History  Administered Date(s) Administered  . Influenza Whole 10/06/2011  . Pneumococcal Polysaccharide 01/05/2006  . Td  01/05/2006  . Zoster 01/06/2007    History  Substance Use Topics  . Smoking status: Never Smoker   . Smokeless tobacco: Never Used  . 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 Tobacco Use: none Alcohol:none Caffeine:: none Exercises:none Diet: unrestricted Pets in Home:none   FAMILY HISTORY FATHER : dead. Complications of diabetes and obesity MOTHER: dead; stroke SIBLINGS (4): brother died of suicide  Brother died with MI  Sister: dead. Suicide  Sister; dead. Alcoholism and pancreatitis  Review of Systems  DATA OBTAINED: from patient, medical record GENERAL: Feels well no fevers, fatigue, appetite changes SKIN: No itching, rash or wounds EYES: No eye pain, redness, discharge EARS: No earache, tinnitus. Loss of hearing. NOSE: No congestion, drainage or bleeding . History of allergic rhinitis. MOUTH/THROAT: No mouth or tooth pain, No sore throat, No difficulty chewing or swallowing  RESPIRATORY: No cough, wheezing,  SOB CARDIAC: No chest pain, palpitations, lower extremity edema  GI: No abdominal pain, No N/V/D or constipation, No heartburn or reflux  GU: No dysuria, frequency or urgency. Stress incontinence.  MUSCULOSKELETAL: No unrelieved bone/joint pain. Generalized arthritis. Known osteoporosis. NEUROLOGIC: Awake, alert, appropriate to situation, No change in mental status. Moves all four, no focal deficits.. Depressive symptoms come and go. PSYCHIATRIC: No overt anxiety or sadness. Sleeps well. No behavior issue.  AMBULATION:  Cautious. Uses wallker  Filed Vitals:   08/08/12 1347  BP: 124/64  Pulse: 68  Temp: 96.9 F (36.1 C)  Resp: 14    Physical Exam  GENERAL APPEARANCE: Alert, conversant. Appropriately groomed. No acute distress.  SKIN: No diaphoresis rash, or wounds. Tinea pedis. Multiple SK. HEAD: Normocephalic, atraumatic  EYES: Conjunctiva/lids clear. Pupils round, reactive. EOMs intact. Bilateral lens implants. EARS: External exam WNL, canals clear. Hearing impaired. Using aids bilaterally. NOSE: No deformity or discharge.  MOUTH/THROAT: Lips w/o lesions. Mouth and throat normal. Tongue moist, w/o lesion.  NECK: No thyroid tenderness, enlargement or nodule  RESPIRATORY: Breathing is even, unlabored. Lung sounds are clear   CARDIOVASCULAR: 3/6 SEM at LSB ARTERIAL: radial pulse 2+, DP pulse 1+  VENOUS: Varicose leg veins bilaterally. . No venous stasis skin changes  GASTROINTESTINAL: Abdomen is soft, non-tender, not distended w/ normal bowel sounds. No mass, ventral or inguinal hernia. No organomegally GENITOURINARY: Bladder non tender, not distended . Perianal atrophy and atrophy of vulva and labia. MUSCULOSKELETAL: No abnormal joints or musculature. Crepitance at bothh knees. NEUROLOGIC: Oriented X3. Cranial nerves 2-12 grossly intact. Moves all extremities no tremor. Reduced vibratory sensations in the right leg PSYCHIATRIC: Mood and affect appropriate to situation, no  behavioral issues    Functional assessment: independent in all ADL   LABS REVIEWED 12/12/2009 CMP: Glucose 99, BUN 29, Creatinine 0.97 12/24/2009 CBC Wbc 5.9 Rbc 3.86 Hemoglobin 12.4  BMP Glucose 91Bun 27  Creatinine 9.8  TSH 0.395 04/01/2010 CMP Glucose 87 Bun 26 Creatinine 1.31  TSH: 0.796  Vitamin D  25 Hydroxy 43 07/05/2010 [facility] Chem 8 glucose 99, BUN 27, Creatinine 1.40 Troponin <0.30 CBC Wbc 11.4, Rbc 3.63, Hgb 12.1, Hct 34.9 CMP: glucose 102, BUN 27, Creatinine 1.22, Albumin 3.2 CK 2.0 Urine Wbc 11-20, Rbc 0-2, Bacteria many 07/12/10 TSH 0.549 07/22/10 e.coli UTI. tx with Cipro 07/22/2010 CBC: WBC 12.5, RBC 3.45, Hemoglobin 11.4 BMP: Glucose 22, BUN 30, Creatinine 1.30 08/19/2010 CBC: WBC 8.6, RBC 3.65, Hemoglobin 11.5 CMP: Glucose  90, BUN 28, Creatinine 1.14 04/14/2011 BMP: Glucose 75, BUN 41, Creatinine 1.33 TSH 0.816 07/17/2011 BNP 244.2 BMP : glucose 79, BUN 33, Creatinine 1.28 10/13/2011 CMP: glucose 92, BUN 41, Creatinine 1.43 TSH 0.879 BNP 178.0 02/23/2012 BMP: Sodium 139, Potassium 4.8, glucose 128, BUN 39, Creatinine 1.31 TSH 0.292 BNP 370.9   06/07/12 CBC: nl   CMP: glu 86, BUN 45, creat 1.46   Lipids; TC 174,, trig 98, HDL 65, LDL 89   TSH 15.360   A1c 5.3  Assessment and Plan Unspecified hypothyroidism: most recent TSH is high on 75 mcg levothyroxine   - Plan: levothyroxine (SYNTHROID, LEVOTHROID) 88 MCG tablet  Pain in right knee: continue with liniments and tramadol  Congestive heart failure, unspecified: controlled  Chronic kidney disease, stage III (moderate): stable     French Kendra, Lenon Curt, MD

## 2012-09-12 DIAGNOSIS — H532 Diplopia: Secondary | ICD-10-CM | POA: Insufficient documentation

## 2012-09-22 ENCOUNTER — Encounter: Payer: Self-pay | Admitting: Internal Medicine

## 2012-09-22 ENCOUNTER — Non-Acute Institutional Stay: Payer: Medicare Other | Admitting: Internal Medicine

## 2012-09-22 VITALS — BP 106/58 | HR 76 | Ht 63.0 in | Wt 171.0 lb

## 2012-09-22 DIAGNOSIS — R32 Unspecified urinary incontinence: Secondary | ICD-10-CM

## 2012-09-22 DIAGNOSIS — I10 Essential (primary) hypertension: Secondary | ICD-10-CM

## 2012-09-22 DIAGNOSIS — R269 Unspecified abnormalities of gait and mobility: Secondary | ICD-10-CM

## 2012-09-22 DIAGNOSIS — I509 Heart failure, unspecified: Secondary | ICD-10-CM

## 2012-09-22 DIAGNOSIS — M25569 Pain in unspecified knee: Secondary | ICD-10-CM

## 2012-09-22 DIAGNOSIS — K279 Peptic ulcer, site unspecified, unspecified as acute or chronic, without hemorrhage or perforation: Secondary | ICD-10-CM

## 2012-09-22 DIAGNOSIS — M25561 Pain in right knee: Secondary | ICD-10-CM

## 2012-09-22 DIAGNOSIS — H8110 Benign paroxysmal vertigo, unspecified ear: Secondary | ICD-10-CM

## 2012-09-22 DIAGNOSIS — E039 Hypothyroidism, unspecified: Secondary | ICD-10-CM | POA: Insufficient documentation

## 2012-09-22 MED ORDER — SOLIFENACIN SUCCINATE 5 MG PO TABS: ORAL_TABLET | ORAL | Status: DC

## 2012-09-22 NOTE — Progress Notes (Signed)
Subjective:    Patient ID: Ann [name], female    DOB: [birth year], 79 y.o.   MRN: [id number]  HPI Leg pains at night. Rubs with Aspercreme. Waits until they hurt before applying  Unspecified urinary incontinence: Urine leakage. Nocturia times 4.  Hypothyroidism : Controlled  Congestive heart failure, unspecified: Controlled  Pain in right knee: Chronic and unchanged  Hypertension: Controlled  Benign paroxysmal positional vertigo: Improved  Abnormality of gait: Poor balance and knee pains contribute to gait abnormalities.  Reflux esophagitis: Asymptomatic  Peptic ulcer, unspecified site, unspecified as acute or chronic, without mention of hemorrhage, perforation, or obstruction: Prior history. She would like to consider using something other than pantoprazole.    Current Outpatient Prescriptions on File Prior to Visit  Medication Sig Dispense Refill  . acetaminophen (TYLENOL) 500 MG tablet Take 500 mg by mouth as needed for pain (takes 2 per day or prn).      Marland Kitchen aspirin 81 MG tablet Take 81 mg by mouth daily.        . Calcium & Magnesium Carbonates (MYLANTA PO) Take by mouth as needed.        . Cetirizine HCl (ZYRTEC PO) Take by mouth as needed.        . Cholecalciferol (VITAMIN D PO) Take by mouth. Taking 1000 daily       . furosemide (LASIX) 20 MG tablet TAKE 1 TABLET ONCE DAILY TO PREVENT EDEMA.  30 tablet  5  . GuaiFENesin (MUCINEX PO) Take by mouth as needed. Taking vitamin d3 1000 dILY      . levothyroxine (SYNTHROID, LEVOTHROID) 88 MCG tablet Take one tablet daily for thyroid supplement  30 tablet  11  . losartan (COZAAR) 25 MG tablet Take 1 tablet (25 mg total) by mouth daily.  30 tablet  5  . meclizine (ANTIVERT) 25 MG tablet Take 25 mg by mouth as directed.      . metoprolol tartrate (LOPRESSOR) 25 MG tablet Take 1 tablet (25 mg total) by mouth daily. 1/2 daily  30 tablet  5  . multivitamin (THERAGRAN) per tablet Take 1 tablet by mouth daily.        . NYSTATIN PO  Take by mouth as needed.        . pantoprazole (PROTONIX) 40 MG tablet TAKE 1 TABLET DAILY FOR ACID REFLUX.  60 tablet  3  . potassium chloride SA (K-DUR,KLOR-CON) 20 MEQ tablet TAKE 1 TABLET EACH DAY.  30 tablet  3  . SALINE NASAL SPRAY NA by Nasal route as needed.        . triamcinolone (KENALOG) 0.1 % lotion Apply 1 application topically as needed.         No current facility-administered medications on file prior to visit.    Review of Systems  Constitutional: Negative.   HENT: Positive for hearing loss.   Eyes: Negative.   Respiratory: Negative.   Cardiovascular: Negative.   Gastrointestinal: Negative.   Genitourinary:       Stress incontinence.  Musculoskeletal: Positive for gait problem.       Chronic knee pains. Uses a walker.  Skin: Negative.   Allergic/Immunologic: Negative.   Neurological: Negative.   Hematological: Negative.   Psychiatric/Behavioral:       Depressive symptoms come and go.       Objective:BP 106/58  Pulse 76  Ht 5\' 3"  (1.6 m)  Wt 171 lb (77.565 kg)  BMI 30.3 kg/m2    Physical Exam  Constitutional: She is oriented to  person, place, and time. She appears well-developed and well-nourished. No distress.  HENT:  Head: Normocephalic and atraumatic.  Right Ear: External ear normal.  Left Ear: External ear normal.  Nose: Nose normal.  Mouth/Throat: Oropharynx is clear and moist.  Loss of hearing bilaterally.  Eyes: Conjunctivae and EOM are normal. Pupils are equal, round, and reactive to light.  Neck: No JVD present. No tracheal deviation present. No thyromegaly present.  Cardiovascular:  Regular rhythm. Grade 3/6 systolic ejection murmur at left sternal border. No heaves or thrills on palpation. Dorsalis pedis and posterior tibial arteries have diminished pulses bilaterally. Varicose leg veins bilaterally.  Abdominal: She exhibits no distension and no mass. There is no tenderness.  Musculoskeletal: She exhibits no edema.  Crepitance in both  knees.  Lymphadenopathy:    She has no cervical adenopathy.  Neurological: She is alert and oriented to person, place, and time. No cranial nerve deficit. Coordination normal.  Reduced vibratory sensation in the right leg and foot.  Skin: No rash noted. No erythema. No pallor.  Psychiatric: She has a normal mood and affect. Her behavior is normal. Judgment and thought content normal.     LINES REVIEWED 09/15/12 TSH 5.104     Assessment & Plan:  Unspecified urinary incontinence - Plan: solifenacin (VESICARE) 5 MG tablet  Hypothyroidism - Plan: Continue current medication. Repeat TSH prior to next visit.  Congestive heart failure, unspecified: Stable  Pain in right knee: Unchanged  Hypertension: Continue current medication   - Plan: CMP  Benign paroxysmal positional vertigo: Asymptomatic at this time  Abnormality of gait: Continue use walker  Reflux esophagitis: Advised her she could try ranitidine 150 mg twice daily or she could try her pantoprazole every other day.  Peptic ulcer, unspecified site, unspecified as acute or chronic, without mention of hemorrhage, perforation, or obstruction: She will try reducing pentup result of a day and will consider switching to ranitidine 150 mg twice daily

## 2012-09-22 NOTE — Patient Instructions (Addendum)
Continue current medications.Add Vesicare for bladder control.

## 2012-10-17 ENCOUNTER — Other Ambulatory Visit: Payer: Self-pay | Admitting: Cardiology

## 2012-10-27 ENCOUNTER — Other Ambulatory Visit: Payer: Self-pay | Admitting: Internal Medicine

## 2012-11-22 ENCOUNTER — Ambulatory Visit (INDEPENDENT_AMBULATORY_CARE_PROVIDER_SITE_OTHER): Payer: Medicare Other | Admitting: Cardiology

## 2012-11-22 ENCOUNTER — Encounter: Payer: Self-pay | Admitting: Cardiology

## 2012-11-22 VITALS — BP 132/55 | HR 60 | Ht 63.0 in | Wt 169.8 lb

## 2012-11-22 DIAGNOSIS — I119 Hypertensive heart disease without heart failure: Secondary | ICD-10-CM

## 2012-11-22 DIAGNOSIS — R002 Palpitations: Secondary | ICD-10-CM

## 2012-11-22 DIAGNOSIS — I4949 Other premature depolarization: Secondary | ICD-10-CM

## 2012-11-22 DIAGNOSIS — E039 Hypothyroidism, unspecified: Secondary | ICD-10-CM

## 2012-11-22 DIAGNOSIS — I059 Rheumatic mitral valve disease, unspecified: Secondary | ICD-10-CM

## 2012-11-22 DIAGNOSIS — I340 Nonrheumatic mitral (valve) insufficiency: Secondary | ICD-10-CM

## 2012-11-22 DIAGNOSIS — I493 Ventricular premature depolarization: Secondary | ICD-10-CM

## 2012-11-22 NOTE — Patient Instructions (Signed)
Your physician recommends that you continue on your current medications as directed. Please refer to the Current Medication list given to you today.  Your physician recommends that you schedule a follow-up appointment in: 4 month ov  

## 2012-11-22 NOTE — Assessment & Plan Note (Signed)
Blood pressure was remaining stable since last visit.  She was recently worked up for diplopia by her ophthalmologist.  She had an MRI which was negative.  No contrast was used at the patient's request.

## 2012-11-22 NOTE — Assessment & Plan Note (Signed)
She is not having any symptoms of CHF 

## 2012-11-22 NOTE — Progress Notes (Signed)
Ann [name] Date of Birth:  [birth year] [address] [place], [place]  [address] [phone]         Fax   [phone]  History of Present Illness: This pleasant 79 year old woman is seen for a scheduled followup office visit. She has a past history of palpitations and supraventricular tachycardia. She also has a history of mitral regurgitation. She had an echocardiogram in May 2011 which showed normal left ventricular systolic function with an ejection fraction of 55-60% and an impaired relaxation. She has had a past history of congestive heart failure secondary to diastolic dysfunction. She does not have any history of ischemic heart disease. She has a history of arthritis and previously had been on tramadol which was recently stopped by her primary care physician because of worsening renal function. She was seen as a work in September 2013 complaining of palpitations and she wore a 30 day monitor from 09/09/11 until 10/08/11 and the monitor did not show any significant arrhythmias. She was in normal sinus rhythm with occasional PVCs and PACs.  She recently was seen by her orthopedist Dr. Otelia Sergeant for painful swelling of the right knee.  She is wearing a brace on her right knee from Dr.Nitka.  She still wants to avoid surgery if possible.  She is scheduled to have a trial of Synvisc injections.   Current Outpatient Prescriptions  Medication Sig Dispense Refill  . acetaminophen (TYLENOL) 500 MG tablet Take 500 mg by mouth as needed for pain (takes 2 per day or prn).      Marland Kitchen aspirin 81 MG tablet Take 81 mg by mouth daily.        . Calcium & Magnesium Carbonates (MYLANTA PO) Take by mouth as needed.        . Cetirizine HCl (ZYRTEC PO) Take by mouth as needed.        . Cholecalciferol (VITAMIN D PO) Take by mouth. Taking 1000 daily       . FLUVIRIN INJ injection       . furosemide (LASIX) 20 MG tablet TAKE 1 TABLET ONCE DAILY TO PREVENT EDEMA.  30 tablet  5  . GuaiFENesin  (MUCINEX PO) Take by mouth as needed. Taking vitamin d3 1000 dILY      . levothyroxine (SYNTHROID, LEVOTHROID) 88 MCG tablet Take one tablet daily for thyroid supplement  30 tablet  11  . losartan (COZAAR) 25 MG tablet TAKE 1 TABLET ONCE DAILY.  30 tablet  1  . meclizine (ANTIVERT) 25 MG tablet Take 25 mg by mouth as directed.      . metoprolol tartrate (LOPRESSOR) 25 MG tablet Take 1 tablet (25 mg total) by mouth daily. 1/2 daily  30 tablet  5  . multivitamin (THERAGRAN) per tablet Take 1 tablet by mouth daily.        . NYSTATIN PO Take by mouth as needed.        . nystatin-triamcinolone (MYCOLOG II) cream APPLY TO RASH TWICE A DAY FOR 2 WEEKS.  30 g  5  . pantoprazole (PROTONIX) 40 MG tablet TAKE 1 TABLET DAILY FOR ACID REFLUX.--Every other day      . potassium chloride SA (K-DUR,KLOR-CON) 20 MEQ tablet TAKE 1 TABLET EACH DAY.  30 tablet  5  . SALINE NASAL SPRAY NA by Nasal route as needed.        . solifenacin (VESICARE) 5 MG tablet Take 5 mg by mouth daily. 1/2 Tablet daily--One daily to help bladder control      .  triamcinolone (KENALOG) 0.1 % lotion Apply 1 application topically as needed.         No current facility-administered medications for this visit.    Allergies  Allergen Reactions  . Ace Inhibitors Cough  . Codeine   . Darvon   . Mercurochrome [Merbromin (Mercurochrome)]   . Pantoprazole     Dizzy  . Sulfa Drugs Cross Reactors   . Tramadol     Abnormal kidney function    Patient Active Problem List   Diagnosis Date Noted  . Mitral regurgitation 04/17/2010    Priority: Medium  . Palpitations 04/17/2010    Priority: Medium  . Spinal stenosis, lumbar 04/17/2010    Priority: Medium  . Unspecified urinary incontinence 09/22/2012  . Unspecified hypothyroidism 09/22/2012  . Hypertension   . Pain in right knee 08/08/2012  . Congestive heart failure, unspecified 08/08/2012  . Chronic kidney disease, stage III (moderate) 08/08/2012  . PVC's (premature ventricular  contractions) 09/08/2011  . Dizziness - light-headed 09/08/2011  . Benign paroxysmal positional vertigo 07/24/2010  . Vertigo 06/06/2010  . Benign hypertensive heart disease without heart failure 04/17/2010  . Chronic diastolic congestive heart failure 04/17/2010  . Hypothyroidism 04/17/2010  . Abnormality of gait 05/09/2009  . Reflux esophagitis 01/06/1996  . Peptic ulcer, unspecified site, unspecified as acute or chronic, without mention of hemorrhage, perforation, or obstruction 01/06/1996    History  Smoking status  . Never Smoker   Smokeless tobacco  . Never Used    History  Alcohol Use No    Family History  Problem Relation Age of Onset  . Stroke Mother   . Heart disease Father   . Diabetes Father   . Heart disease Brother     MI  . Alcohol abuse Sister     Review of Systems: Constitutional: no fever chills diaphoresis or fatigue or change in weight.  Head and neck: no hearing loss, no epistaxis, no photophobia or visual disturbance. Respiratory: No cough, shortness of breath or wheezing. Cardiovascular: No chest pain peripheral edema, palpitations. Gastrointestinal: No abdominal distention, no abdominal pain, no change in bowel habits hematochezia or melena. Genitourinary: No dysuria, no frequency, no urgency, no nocturia. Musculoskeletal:No arthralgias, no back pain, no gait disturbance or myalgias. Neurological: No dizziness, no headaches, no numbness, no seizures, no syncope, no weakness, no tremors. Hematologic: No lymphadenopathy, no easy bruising. Psychiatric: No confusion, no hallucinations, no sleep disturbance.    Physical Exam: Filed Vitals:   11/22/12 1122  BP: 132/55  Pulse: 60   the general appearance reveals a alert elderly woman in no distress.The head and neck exam reveals pupils equal and reactive.  Extraocular movements are full.  There is no scleral icterus.  The mouth and pharynx are normal.  The neck is supple.  The carotids reveal no  bruits.  The jugular venous pressure is normal.  The  thyroid is not enlarged.  There is no lymphadenopathy.  The chest is clear to percussion and auscultation.  There are no rales or rhonchi.  Expansion of the chest is symmetrical.  The precordium is quiet.  The first heart sound is normal.  The second heart sound is physiologically split.  There is no  gallop rub or click.  There is a grade 2/6 holosystolic murmur mitral regurgitation at apex.  There is no abnormal lift or heave.  The abdomen is soft and nontender.  The bowel sounds are normal.  The liver and spleen are not enlarged.  There are no abdominal masses.  There are no abdominal bruits.  Extremities reveal good pedal pulses.  There is no phlebitis or edema.  There is no cyanosis or clubbing.  Strength is normal and symmetrical in all extremities.  There is no lateralizing weakness.  There are no sensory deficits.  The skin is warm and dry.  There is no rash.   EKG today shows normal sinus rhythm with nonspecific ST-T wave flattening, unchanged since 11/11/11  Assessment / Plan: Continue same medication.  Recheck in 4 months for office visit

## 2012-11-22 NOTE — Assessment & Plan Note (Signed)
She has not been experiencing any awareness of recent PVCs

## 2012-12-19 ENCOUNTER — Other Ambulatory Visit: Payer: Self-pay | Admitting: Cardiology

## 2012-12-22 ENCOUNTER — Non-Acute Institutional Stay: Payer: Medicare Other | Admitting: Nurse Practitioner

## 2012-12-22 ENCOUNTER — Encounter: Payer: Self-pay | Admitting: Nurse Practitioner

## 2012-12-22 VITALS — BP 100/62 | HR 68 | Temp 96.8°F | Wt 171.0 lb

## 2012-12-22 DIAGNOSIS — K121 Other forms of stomatitis: Secondary | ICD-10-CM

## 2012-12-22 DIAGNOSIS — I119 Hypertensive heart disease without heart failure: Secondary | ICD-10-CM

## 2012-12-22 DIAGNOSIS — I509 Heart failure, unspecified: Secondary | ICD-10-CM

## 2012-12-22 DIAGNOSIS — I5032 Chronic diastolic (congestive) heart failure: Secondary | ICD-10-CM

## 2012-12-22 DIAGNOSIS — R32 Unspecified urinary incontinence: Secondary | ICD-10-CM

## 2012-12-22 DIAGNOSIS — E039 Hypothyroidism, unspecified: Secondary | ICD-10-CM

## 2012-12-22 NOTE — Assessment & Plan Note (Signed)
Sore mouth and throat, denied congestion, cough, sputum production, or SOB--try Magic Mouth Wash 5ml s/s ac and hs for 2 weeks. Monitor for developing s/s flu/respiratory symptoms.

## 2012-12-22 NOTE — Assessment & Plan Note (Addendum)
Managed with Vesicare 5mg  qhs-no urinary retention. Less nocturnal urination. But she has experienced double vision-has new lens modifier, dry mouth-sore throat, crusty nostrils, constipation--will dc Vesicare for now. May consider Myrbetriq later.

## 2012-12-22 NOTE — Assessment & Plan Note (Signed)
Controlled. Takes Lasix, Losartan, Metoprolol

## 2012-12-22 NOTE — Progress Notes (Signed)
Patient ID: Ann [name], female   DOB: 07/15/1918, 79 y.o.   MRN: [id number]   Code Status: living will.   Allergies  Allergen Reactions  . Ace Inhibitors Cough  . Codeine   . Darvon   . Mercurochrome [Merbromin (Mercurochrome)]   . Pantoprazole     Dizzy  . Sulfa Drugs Cross Reactors   . Tramadol     Abnormal kidney function    Chief Complaint  Patient presents with  . Sore Throat    for 4 days, no fever    HPI: Patient is a 79 y.o. female seen in the clinic at [facility] today for sore mouth/throat and other chronic medical conditions.  Problem List Items Addressed This Visit   Benign hypertensive heart disease without heart failure - Primary     Controlled. Takes Lasix, Losartan, Metoprolol    Chronic diastolic congestive heart failure     F/u Cardiology. Clinically compensated. Takes Lasix 20mg  and Metoprolol 12.5mg  daily.     Hypothyroidism     TSH trending down from 15.36 06/07/12 to 5.104 09/15/12--will update TSH    Congestive heart failure, unspecified     Compensated clinically. Continue Furosemide 20mg  daily.     Unspecified urinary incontinence     Managed with Vesicare 5mg  qhs-no urinary retention. Less nocturnal urination. But she has experienced double vision-has new lens modifier, dry mouth-sore throat, crusty nostrils, constipation--will dc Vesicare for now. May consider Myrbetriq later.     Reflux esophagitis     Controlled with Protonix 40mg  daily and prn Mylanta 30cc q4h. She thinks Protonix has caused some skin rash on her back--now she tries to takes it POA to reduce the possible negative side effect.     Stomatitis     Sore mouth and throat, denied congestion, cough, sputum production, or SOB--try Magic Mouth Wash 5ml s/s ac and hs for 2 weeks. Monitor for developing s/s flu/respiratory symptoms.        Review of Systems:  Review of Systems  Constitutional: Negative for fever, chills, weight loss, malaise/fatigue and diaphoresis.    HENT: Positive for hearing loss and sore throat. Negative for congestion, ear discharge, ear pain, nosebleeds and tinnitus.   Eyes: Positive for double vision. Negative for blurred vision, photophobia, pain, discharge and redness.  Respiratory: Negative for cough, hemoptysis, sputum production, shortness of breath, wheezing and stridor.   Cardiovascular: Negative for chest pain, palpitations, orthopnea, claudication, leg swelling and PND.  Gastrointestinal: Negative for heartburn, nausea, vomiting, abdominal pain, diarrhea, constipation, blood in stool and melena.  Genitourinary: Positive for frequency. Negative for dysuria, urgency, hematuria and flank pain.  Musculoskeletal: Negative for back pain, falls, joint pain, myalgias and neck pain.  Skin: Positive for itching and rash.       Upper back  Neurological: Negative for dizziness, tingling, tremors, sensory change, speech change, focal weakness, seizures, loss of consciousness, weakness and headaches.  Endo/Heme/Allergies: Negative for environmental allergies. Does not bruise/bleed easily.  Psychiatric/Behavioral: Negative for depression, suicidal ideas, hallucinations, memory loss and substance abuse. The patient is not nervous/anxious and does not have insomnia.      Past Medical History  Diagnosis Date  . Mitral regurgitation   . Hypertension   . Palpitations   . Nocturnal dyspnea   . Advanced age   . Diastolic heart failure     Last echo in 5/11  . Pain in joint, lower leg 02/25/2012  . Other abnormal blood chemistry 02/25/2012  . Pain in joint, lower  leg 02/23/2012  . Encounter for long-term (current) use of other medications 10/02/2011  . Cough 07/17/2011  . Internal hemorrhoids without mention of complication 01/01/2011  . Unspecified constipation 01/01/2011  . Rash and other nonspecific skin eruption 08/14/2010  . Tension headache 07/31/2010  . Benign paroxysmal positional vertigo 07/24/2010  . Cervicalgia 07/24/2010  . Urinary  tract infection, site not specified 07/10/2010  . Abnormality of gait 05/09/2009  . Vitamin D deficiency 01/25/2009  . Lumbago 01/05/2008  . Cervicitis and endocervicitis 12/22/2007  . Actinic keratosis 12/22/2007  . Pain in limb 12/22/2007  . Muscle weakness (generalized) 08/04/2007  . Blepharochalasis 05/12/2007  . Insomnia, unspecified 05/12/2007  . Dermatophytosis of foot 07/14/2006  . Dizziness and giddiness 06/25/2006  . Allergic rhinitis due to pollen 04/21/2006  . Dyskinesia of esophagus 04/12/2006  . Other malaise and fatigue 04/01/2006  . Unspecified urinary incontinence 02/10/2006  . Cramp of limb 10/07/2005  . Restless legs syndrome (RLS) 06/30/2005  . Chronic kidney disease, stage II (mild) 01/02/2005  . Unspecified hypothyroidism 09/10/2004  . Diaphragmatic hernia without mention of obstruction or gangrene 10/03/2003  . Congestive heart failure, unspecified 01/05/2002  . Unspecified venous (peripheral) insufficiency 01/05/1998  . Reflux esophagitis 01/06/1996  . Peptic ulcer, unspecified site, unspecified as acute or chronic, without mention of hemorrhage, perforation, or obstruction 01/06/1996  . Diverticulosis of colon (without mention of hemorrhage) 01/06/1996  . Pain in right knee 08/08/2012  . Macular degeneration of right eye 1980's   Past Surgical History  Procedure Laterality Date  . Tonsillectomy and adenoidectomy  1940  . Appendectomy  1940's  . Oophorectomy  1940's  . Breast surgery  1950's    benign tumors removed  . Cataract extraction w/ intraocular lens  implant, bilateral  1982  . Lesion excision  04/2002    melanomas of scalp  Dr. Park Liter  . Skin cancer excision  2005    BCC of right temple  . Cholecystectomy, laparoscopic  10/2004    Dr. Orson Slick  . Tooth extraction  06/2008    and bone implant  Dr. Reva Bores  . Colonoscopy  2000   Social History:   reports that she has never smoked. She has never used smokeless tobacco. She reports that she does not drink alcohol or  use illicit drugs.  Family History  Problem Relation Age of Onset  . Stroke Mother   . Heart disease Father   . Diabetes Father   . Heart disease Brother     MI  . Alcohol abuse Sister     Medications: Patient's Medications  New Prescriptions   No medications on file  Previous Medications   ACETAMINOPHEN (TYLENOL) 500 MG TABLET    Take 500 mg by mouth as needed for pain (takes 2 per day or prn).   ASPIRIN 81 MG TABLET    Take 81 mg by mouth daily.     CALCIUM & MAGNESIUM CARBONATES (MYLANTA PO)    Take by mouth as needed.     CETIRIZINE HCL (ZYRTEC PO)    Take by mouth as needed.     CHOLECALCIFEROL (VITAMIN D PO)    Take by mouth. Taking 1000 daily    FLUVIRIN INJ INJECTION       FUROSEMIDE (LASIX) 20 MG TABLET    TAKE 1 TABLET ONCE DAILY TO PREVENT EDEMA.   GUAIFENESIN (MUCINEX PO)    Take by mouth as needed. Taking vitamin d3 1000 dILY   LEVOTHYROXINE (SYNTHROID, LEVOTHROID) 88 MCG TABLET  Take one tablet daily for thyroid supplement   LOSARTAN (COZAAR) 25 MG TABLET    TAKE 1 TABLET ONCE DAILY.   MECLIZINE (ANTIVERT) 25 MG TABLET    Take 25 mg by mouth as directed.   METOPROLOL TARTRATE (LOPRESSOR) 25 MG TABLET    Take 1 tablet (25 mg total) by mouth daily. 1/2 daily   MULTIVITAMIN (THERAGRAN) PER TABLET    Take 1 tablet by mouth daily.     NYSTATIN PO    Take by mouth as needed.     NYSTATIN-TRIAMCINOLONE (MYCOLOG II) CREAM    APPLY TO RASH TWICE A DAY FOR 2 WEEKS.   PANTOPRAZOLE (PROTONIX) 40 MG TABLET    TAKE 1 TABLET DAILY FOR ACID REFLUX.--Every other day   POTASSIUM CHLORIDE SA (K-DUR,KLOR-CON) 20 MEQ TABLET    TAKE 1 TABLET EACH DAY.   SALINE NASAL SPRAY NA    by Nasal route as needed.     SOLIFENACIN (VESICARE) 5 MG TABLET    Take 5 mg by mouth daily. 1/2 Tablet daily--One daily to help bladder control   TRIAMCINOLONE (KENALOG) 0.1 % LOTION    Apply 1 application topically as needed.    Modified Medications   No medications on file  Discontinued Medications   No  medications on file     Physical Exam: Physical Exam  Constitutional: She is oriented to person, place, and time. She appears well-developed and well-nourished. No distress.  HENT:  Head: Normocephalic and atraumatic.  Right Ear: External ear normal.  Left Ear: External ear normal.  Nose: Nose normal.  Mouth/Throat: Oropharynx is clear and moist.  Loss of hearing bilaterally. Mil d inflamed oral cavity and throat  Eyes: Conjunctivae and EOM are normal. Pupils are equal, round, and reactive to light.  Neck: No JVD present. No tracheal deviation present. No thyromegaly present.  Cardiovascular:  Regular rhythm. Grade 3/6 systolic ejection murmur at left sternal border. No heaves or thrills on palpation. Dorsalis pedis and posterior tibial arteries have diminished pulses bilaterally. Varicose leg veins bilaterally.  Abdominal: She exhibits no distension and no mass. There is no tenderness.  Musculoskeletal: She exhibits no edema.  Crepitance in both knees.  Lymphadenopathy:    She has no cervical adenopathy.  Neurological: She is alert and oriented to person, place, and time. No cranial nerve deficit. Coordination normal.  Reduced vibratory sensation in the right leg and foot.  Skin: Rash noted. No erythema. No pallor.  Upper back rash   Psychiatric: She has a normal mood and affect. Her behavior is normal. Judgment and thought content normal.    Filed Vitals:   12/22/12 1612  BP: 100/62  Pulse: 68  Temp: 96.8 F (36 C)  TempSrc: Oral  Weight: 171 lb (77.565 kg)      Labs reviewed: Basic Metabolic Panel:  Recent Labs  16/10/96 09/15/12  NA 138  --   K 4.6  --   BUN 45*  --   CREATININE 1.5*  --   TSH  --  5.10   Liver Function Tests:  Recent Labs  06/07/12  AST 26  ALT 15  ALKPHOS 126*   CBC:  Recent Labs  06/07/12  WBC 6.4  HGB 12.7  HCT 37  PLT 302   Lipid Panel:  Lipid Panel     Component Value Date/Time   CHOL 174 06/07/2012   TRIG 98  06/07/2012   HDL 65 06/07/2012   LDLCALC 89 06/07/2012    n  Assessment/Plan  Benign hypertensive heart disease without heart failure Controlled. Takes Lasix, Losartan, Metoprolol  Chronic diastolic congestive heart failure F/u Cardiology. Clinically compensated. Takes Lasix 20mg  and Metoprolol 12.5mg  daily.   Hypothyroidism TSH trending down from 15.36 06/07/12 to 5.104 09/15/12--will update TSH  Reflux esophagitis Controlled with Protonix 40mg  daily and prn Mylanta 30cc q4h. She thinks Protonix has caused some skin rash on her back--now she tries to takes it POA to reduce the possible negative side effect.   Unspecified urinary incontinence Managed with Vesicare 5mg  qhs-no urinary retention. Less nocturnal urination. But she has experienced double vision-has new lens modifier, dry mouth-sore throat, crusty nostrils, constipation--will dc Vesicare for now. May consider Myrbetriq later.   Congestive heart failure, unspecified Compensated clinically. Continue Furosemide 20mg  daily.   Stomatitis Sore mouth and throat, denied congestion, cough, sputum production, or SOB--try Magic Mouth Wash 5ml s/s ac and hs for 2 weeks. Monitor for developing s/s flu/respiratory symptoms.     Family/ Staff Communication: observe the patient.   Goals of Care: IL  Labs/tests ordered: none

## 2012-12-22 NOTE — Assessment & Plan Note (Signed)
F/u Cardiology. Clinically compensated. Takes Lasix 20mg  and Metoprolol 12.5mg  daily.

## 2012-12-22 NOTE — Assessment & Plan Note (Signed)
TSH trending down from 15.36 06/07/12 to 5.104 09/15/12--will update TSH

## 2012-12-22 NOTE — Assessment & Plan Note (Signed)
Compensated clinically. Continue Furosemide 20mg daily. 

## 2012-12-22 NOTE — Assessment & Plan Note (Addendum)
Controlled with Protonix 40mg  daily and prn Mylanta 30cc q4h. She thinks Protonix has caused some skin rash on her back--now she tries to takes it POA to reduce the possible negative side effect.

## 2013-01-03 LAB — HEPATIC FUNCTION PANEL
ALT: 10 U/L (ref 7–35)
AST: 24 U/L (ref 13–35)
Alkaline Phosphatase: 94 U/L (ref 25–125)
Bilirubin, Total: 0.6 mg/dL

## 2013-01-03 LAB — TSH: TSH: 5.32 u[IU]/mL (ref 0.41–5.90)

## 2013-01-03 LAB — BASIC METABOLIC PANEL
BUN: 35 mg/dL — AB (ref 4–21)
CREATININE: 1.3 mg/dL — AB (ref 0.5–1.1)
GLUCOSE: 84 mg/dL
Potassium: 4.6 mmol/L (ref 3.4–5.3)
Sodium: 139 mmol/L (ref 137–147)

## 2013-01-12 ENCOUNTER — Encounter: Payer: Self-pay | Admitting: Internal Medicine

## 2013-01-12 ENCOUNTER — Non-Acute Institutional Stay: Payer: Medicare Other | Admitting: Internal Medicine

## 2013-01-12 VITALS — BP 124/60 | HR 60 | Wt 172.0 lb

## 2013-01-12 DIAGNOSIS — R32 Unspecified urinary incontinence: Secondary | ICD-10-CM

## 2013-01-12 DIAGNOSIS — E039 Hypothyroidism, unspecified: Secondary | ICD-10-CM

## 2013-01-12 DIAGNOSIS — N183 Chronic kidney disease, stage 3 unspecified: Secondary | ICD-10-CM

## 2013-01-12 DIAGNOSIS — I10 Essential (primary) hypertension: Secondary | ICD-10-CM

## 2013-01-12 DIAGNOSIS — M25569 Pain in unspecified knee: Secondary | ICD-10-CM

## 2013-01-12 DIAGNOSIS — H532 Diplopia: Secondary | ICD-10-CM

## 2013-01-12 DIAGNOSIS — K1230 Oral mucositis (ulcerative), unspecified: Secondary | ICD-10-CM

## 2013-01-12 DIAGNOSIS — K121 Other forms of stomatitis: Secondary | ICD-10-CM

## 2013-01-12 DIAGNOSIS — I5032 Chronic diastolic (congestive) heart failure: Secondary | ICD-10-CM

## 2013-01-12 DIAGNOSIS — I509 Heart failure, unspecified: Secondary | ICD-10-CM

## 2013-01-12 DIAGNOSIS — M25561 Pain in right knee: Secondary | ICD-10-CM

## 2013-01-12 NOTE — Patient Instructions (Signed)
Continue current medications. 

## 2013-01-12 NOTE — Progress Notes (Signed)
Patient ID: Ann [name], female   DOB: August 17, 1918, 80 y.o.   MRN: [id number]    Location:  [facility]   Place of Service: Clinic (12)    Allergies  Allergen Reactions  . Ace Inhibitors Cough  . Codeine   . Darvon   . Mercurochrome [Merbromin (Mercurochrome)]   . Pantoprazole     Dizzy  . Sulfa Drugs Cross Reactors   . Tramadol     Abnormal kidney function    Chief Complaint  Patient presents with  . Medical Managment of Chronic Issues    blood pressure, thyroid, CHF    HPI:  Hypertension: controlled  Chronic diastolic congestive heart failure: controlled  Hypothyroidism: mild elevation in TSH  Chronic kidney disease, stage III (moderate): unchanged  Unspecified urinary incontinence: unchanged  Stomatitis: improved  Diplopia: seeing Dr. Ellie Lunch  Pain in right knee: seeing Dr. Louanne Skye for injections.    Medications: Patient's Medications  New Prescriptions   No medications on file  Previous Medications   ACETAMINOPHEN (TYLENOL) 500 MG TABLET    Take 500 mg by mouth as needed for pain (takes 2 per day or prn).   ASPIRIN 81 MG TABLET    Take 81 mg by mouth daily.     CALCIUM & MAGNESIUM CARBONATES (MYLANTA PO)    Take by mouth as needed.     CETIRIZINE HCL (ZYRTEC PO)    Take by mouth as needed.     CHOLECALCIFEROL (VITAMIN D PO)    Take by mouth. Taking 1000 daily    FLUVIRIN INJ INJECTION       FUROSEMIDE (LASIX) 20 MG TABLET    TAKE 1 TABLET ONCE DAILY TO PREVENT EDEMA.   LEVOTHYROXINE (SYNTHROID, LEVOTHROID) 88 MCG TABLET    Take one tablet daily for thyroid supplement   LOSARTAN (COZAAR) 25 MG TABLET    TAKE 1 TABLET ONCE DAILY.   MECLIZINE (ANTIVERT) 25 MG TABLET    Take 25 mg by mouth as directed.   METOPROLOL TARTRATE (LOPRESSOR) 25 MG TABLET    Take 1 tablet (25 mg total) by mouth daily. 1/2 daily   MULTIVITAMIN (THERAGRAN) PER TABLET    Take 1 tablet by mouth daily.     NYSTATIN PO    Take by mouth as needed.     NYSTATIN-TRIAMCINOLONE  (MYCOLOG II) CREAM    APPLY TO RASH TWICE A DAY FOR 2 WEEKS.   PANTOPRAZOLE (PROTONIX) 40 MG TABLET    TAKE 1 TABLET DAILY FOR ACID REFLUX.--Every other day   POTASSIUM CHLORIDE SA (K-DUR,KLOR-CON) 20 MEQ TABLET    TAKE 1 TABLET EACH DAY.   SALINE NASAL SPRAY NA    by Nasal route as needed.     TRIAMCINOLONE (KENALOG) 0.1 % LOTION    Apply 1 application topically as needed.    Modified Medications   No medications on file  Discontinued Medications   GUAIFENESIN (MUCINEX PO)    Take by mouth as needed. Taking vitamin d3 1000 dILY   SOLIFENACIN (VESICARE) 5 MG TABLET    Take 5 mg by mouth daily. 1/2 Tablet daily--One daily to help bladder control     Review of Systems  Constitutional: Negative.   HENT: Positive for hearing loss.   Eyes: Negative.   Respiratory: Negative.   Cardiovascular: Negative.   Gastrointestinal: Negative.   Genitourinary:       Stress incontinence.  Musculoskeletal: Positive for gait problem.       Chronic knee pains. Uses a  walker.  Skin: Negative.   Allergic/Immunologic: Negative.   Neurological: Negative.   Hematological: Negative.   Psychiatric/Behavioral:       Depressive symptoms come and go.    Filed Vitals:   01/12/13 1433  BP: 124/60  Pulse: 60  Weight: 172 lb (78.019 kg)   Physical Exam  Constitutional: She is oriented to person, place, and time. She appears well-developed and well-nourished. No distress.  HENT:  Head: Normocephalic and atraumatic.  Right Ear: External ear normal.  Left Ear: External ear normal.  Nose: Nose normal.  Mouth/Throat: Oropharynx is clear and moist.  Loss of hearing bilaterally.  Eyes: Conjunctivae and EOM are normal. Pupils are equal, round, and reactive to light.  Corrective lenses and prisms on her glasses.  Neck: No JVD present. No tracheal deviation present. No thyromegaly present.  Cardiovascular:  Regular rhythm. Grade 3/6 systolic ejection murmur at left sternal border. No heaves or thrills on  palpation. Dorsalis pedis and posterior tibial arteries have diminished pulses bilaterally. Varicose leg veins bilaterally.  Abdominal: She exhibits no distension and no mass. There is no tenderness.  Musculoskeletal: She exhibits no edema.  Crepitance in both knees.  Lymphadenopathy:    She has no cervical adenopathy.  Neurological: She is alert and oriented to person, place, and time. No cranial nerve deficit. Coordination normal.  Reduced vibratory sensation in the right leg and foot.  Skin: No rash noted. No erythema. No pallor.  Psychiatric: She has a normal mood and affect. Her behavior is normal. Judgment and thought content normal.     Labs reviewed: Nursing Home on 01/12/2013  Component Date Value Range Status  . Glucose 01/03/2013 84   Final  . BUN 01/03/2013 35* 4 - 21 mg/dL Final  . Creatinine 01/03/2013 1.3* 0.5 - 1.1 mg/dL Final  . Potassium 01/03/2013 4.6  3.4 - 5.3 mmol/L Final  . Sodium 01/03/2013 139  137 - 147 mmol/L Final  . Alkaline Phosphatase 01/03/2013 94  25 - 125 U/L Final  . ALT 01/03/2013 10  7 - 35 U/L Final  . AST 01/03/2013 24  13 - 35 U/L Final  . Bilirubin, Total 01/03/2013 0.6   Final  . TSH 01/03/2013 5.32  0.41 - 5.90 uIU/mL Final  Nursing Home on 12/22/2012  Component Date Value Range Status  . Hemoglobin 06/07/2012 12.7  12.0 - 16.0 g/dL Final  . HCT 06/07/2012 37  36 - 46 % Final  . Platelets 06/07/2012 302  150 - 399 K/L Final  . WBC 06/07/2012 6.4   Final  . Glucose 06/07/2012 92   Final  . BUN 06/07/2012 45* 4 - 21 mg/dL Final  . Creatinine 06/07/2012 1.5* 0.5 - 1.1 mg/dL Final  . Potassium 06/07/2012 4.6  3.4 - 5.3 mmol/L Final  . Sodium 06/07/2012 138  137 - 147 mmol/L Final  . Triglycerides 06/07/2012 98  40 - 160 mg/dL Final  . Cholesterol 06/07/2012 174  0 - 200 mg/dL Final  . HDL 06/07/2012 65  35 - 70 mg/dL Final  . LDL Cholesterol 06/07/2012 89   Final  . Alkaline Phosphatase 06/07/2012 126* 25 - 125 U/L Final  . ALT  06/07/2012 15  7 - 35 U/L Final  . AST 06/07/2012 26  13 - 35 U/L Final  . Bilirubin, Total 06/07/2012 0.6   Final  . TSH 09/15/2012 5.10  0.41 - 5.90 uIU/mL Final   09/15/12 TSH 5.104 06/07/12 CBC: nl  CMP: BUN 45, creat 1.46  Lipids:  TC 174, trig 98, HDL 65,, LDL89  TSH 15.360  A1c 5.3 2/199/14 TSH 0.292  BNP 370.9  Assessment/Plan Hypertension: controlled  Chronic diastolic congestive heart failure: controlled. Continues to see Dr. Mare Ferrari  Hypothyroidism: mild elevation in TSH  Chronic kidney disease, stage III (moderate): stable  Unspecified urinary incontinence: uunchanged  Stomatitis: improved  Diplopia: followed by ophth.  Pain in right knee: chronic and followed by Dr. Louanne Skye. She says he is planning to inject a "gel" in her knee.

## 2013-02-20 ENCOUNTER — Telehealth: Payer: Self-pay | Admitting: *Deleted

## 2013-02-20 NOTE — Telephone Encounter (Signed)
Pt may use omeprazole 20mg  20 mins before a meal instead of the protonix.

## 2013-02-20 NOTE — Telephone Encounter (Signed)
Patient states the Protonix that she was given is causing a rash. She has tried cutting back on the medication, but that is not working. Wants something OTC please.

## 2013-02-24 NOTE — Telephone Encounter (Signed)
Patient was informed that Dr. Mariea Clonts would like for her to use Omeprazole OTC.

## 2013-02-28 ENCOUNTER — Other Ambulatory Visit: Payer: Self-pay | Admitting: Cardiology

## 2013-03-01 NOTE — Telephone Encounter (Signed)
Fax Received. Refill Completed. Branko Steeves Chowoe (M.A)  

## 2013-03-17 ENCOUNTER — Other Ambulatory Visit: Payer: Self-pay | Admitting: Cardiology

## 2013-03-29 ENCOUNTER — Ambulatory Visit (INDEPENDENT_AMBULATORY_CARE_PROVIDER_SITE_OTHER): Payer: Medicare Other | Admitting: Cardiology

## 2013-03-29 ENCOUNTER — Encounter: Payer: Self-pay | Admitting: Cardiology

## 2013-03-29 VITALS — BP 120/67 | HR 70 | Ht 63.0 in | Wt 171.0 lb

## 2013-03-29 DIAGNOSIS — I119 Hypertensive heart disease without heart failure: Secondary | ICD-10-CM

## 2013-03-29 DIAGNOSIS — I509 Heart failure, unspecified: Secondary | ICD-10-CM

## 2013-03-29 DIAGNOSIS — H532 Diplopia: Secondary | ICD-10-CM

## 2013-03-29 DIAGNOSIS — K2100 Gastro-esophageal reflux disease with esophagitis, without bleeding: Secondary | ICD-10-CM

## 2013-03-29 DIAGNOSIS — I4949 Other premature depolarization: Secondary | ICD-10-CM

## 2013-03-29 DIAGNOSIS — I5032 Chronic diastolic (congestive) heart failure: Secondary | ICD-10-CM

## 2013-03-29 DIAGNOSIS — I493 Ventricular premature depolarization: Secondary | ICD-10-CM

## 2013-03-29 DIAGNOSIS — I340 Nonrheumatic mitral (valve) insufficiency: Secondary | ICD-10-CM

## 2013-03-29 DIAGNOSIS — I059 Rheumatic mitral valve disease, unspecified: Secondary | ICD-10-CM

## 2013-03-29 DIAGNOSIS — R002 Palpitations: Secondary | ICD-10-CM

## 2013-03-29 NOTE — Patient Instructions (Signed)
Your physician recommends that you continue on your current medications as directed. Please refer to the Current Medication list given to you today.  Your physician wants you to follow-up in: 4 month ov You will receive a reminder letter in the mail two months in advance. If you don't receive a letter, please call our office to schedule the follow-up appointment.  

## 2013-03-29 NOTE — Assessment & Plan Note (Signed)
She has a history of reflux esophagitis.  Protonix caused a rash and so she is off PPIs and is using TUMS and Mylanta.  She is also on a dysphagia diet.

## 2013-03-29 NOTE — Progress Notes (Signed)
Ann [name] Date of Birth:  11-04-1918 [address] [place], [place]  [address] [phone]         Fax   [phone]  History of Present Illness: This pleasant 80 year old woman is seen for a scheduled followup office visit. She has a past history of palpitations and supraventricular tachycardia. She also has a history of mitral regurgitation. She had an echocardiogram in May 2011 which showed normal left ventricular systolic function with an ejection fraction of 55-60% and an impaired relaxation. She has had a past history of congestive heart failure secondary to diastolic dysfunction. She does not have any history of ischemic heart disease. She has a history of arthritis and previously had been on tramadol which was recently stopped by her primary care physician because of worsening renal function. She was seen as a work in September 2013 complaining of palpitations and she wore a 30 day monitor from 09/09/11 until 10/08/11 and the monitor did not show any significant arrhythmias. She was in normal sinus rhythm with occasional PVCs and PACs.  She has painful arthritis of the right knee.  Dr. Louanne Skye is her orthopedist.  He recently gave her a shot in the right knee joint which has helped.   Current Outpatient Prescriptions  Medication Sig Dispense Refill  . acetaminophen (TYLENOL) 500 MG tablet Take 500 mg by mouth as needed for pain (takes 2 per day or prn).      Marland Kitchen aspirin 81 MG tablet Take 81 mg by mouth daily.        . Calcium & Magnesium Carbonates (MYLANTA PO) Take by mouth as needed.        . Cetirizine HCl (ZYRTEC PO) Take by mouth as needed.        . Cholecalciferol (VITAMIN D PO) Take by mouth. Taking 1000 daily       . FLUVIRIN INJ injection       . furosemide (LASIX) 20 MG tablet TAKE 1 TABLET ONCE DAILY TO PREVENT EDEMA.  30 tablet  5  . levothyroxine (SYNTHROID, LEVOTHROID) 88 MCG tablet Take one tablet daily for thyroid supplement  30 tablet  11  .  losartan (COZAAR) 25 MG tablet TAKE 1 TABLET ONCE DAILY.  30 tablet  0  . meclizine (ANTIVERT) 25 MG tablet Take 25 mg by mouth as directed.      . metoprolol tartrate (LOPRESSOR) 25 MG tablet TAKE (1/2) TABLET DAILY.  30 tablet  4  . multivitamin (THERAGRAN) per tablet Take 1 tablet by mouth daily.        . NYSTATIN PO Take by mouth as needed.        . nystatin-triamcinolone (MYCOLOG II) cream APPLY TO RASH TWICE A DAY FOR 2 WEEKS.  30 g  5  . potassium chloride SA (K-DUR,KLOR-CON) 20 MEQ tablet TAKE 1 TABLET EACH DAY.  30 tablet  5  . SALINE NASAL SPRAY NA by Nasal route as needed.        . triamcinolone (KENALOG) 0.1 % lotion Apply 1 application topically as needed.         No current facility-administered medications for this visit.    Allergies  Allergen Reactions  . Protonix [Pantoprazole Sodium] Rash  . Ace Inhibitors Cough  . Codeine   . Darvon   . Mercurochrome [Merbromin (Mercurochrome)]   . Pantoprazole     Dizzy  . Sulfa Drugs Cross Reactors   . Tramadol     Abnormal kidney function  .  Vesicare [Solifenacin]     Drys out nose and throat     Patient Active Problem List   Diagnosis Date Noted  . Mitral regurgitation 04/17/2010    Priority: Medium  . Palpitations 04/17/2010    Priority: Medium  . Spinal stenosis, lumbar 04/17/2010    Priority: Medium  . Stomatitis 12/22/2012  . Unspecified urinary incontinence 09/22/2012  . Hypertension   . Diplopia 09/12/2012  . Pain in right knee 08/08/2012  . Congestive heart failure, unspecified 08/08/2012  . Chronic kidney disease, stage III (moderate) 08/08/2012  . PVC's (premature ventricular contractions) 09/08/2011  . Dizziness - light-headed 09/08/2011  . Benign paroxysmal positional vertigo 07/24/2010  . Vertigo 06/06/2010  . Benign hypertensive heart disease without heart failure 04/17/2010  . Chronic diastolic congestive heart failure 04/17/2010  . Hypothyroidism 04/17/2010  . Abnormality of gait 05/09/2009  .  Reflux esophagitis 01/06/1996  . Peptic ulcer, unspecified site, unspecified as acute or chronic, without mention of hemorrhage, perforation, or obstruction 01/06/1996    History  Smoking status  . Never Smoker   Smokeless tobacco  . Never Used    History  Alcohol Use No    Family History  Problem Relation Age of Onset  . Stroke Mother   . Heart disease Father   . Diabetes Father   . Heart disease Brother     MI  . Alcohol abuse Sister     Review of Systems: Constitutional: no fever chills diaphoresis or fatigue or change in weight.  Head and neck: no hearing loss, no epistaxis, no photophobia or visual disturbance. Respiratory: No cough, shortness of breath or wheezing. Cardiovascular: No chest pain peripheral edema, palpitations. Gastrointestinal: No abdominal distention, no abdominal pain, no change in bowel habits hematochezia or melena. Genitourinary: No dysuria, no frequency, no urgency, no nocturia. Musculoskeletal:No arthralgias, no back pain, no gait disturbance or myalgias. Neurological: No dizziness, no headaches, no numbness, no seizures, no syncope, no weakness, no tremors. Hematologic: No lymphadenopathy, no easy bruising. Psychiatric: No confusion, no hallucinations, no sleep disturbance.    Physical Exam: Filed Vitals:   03/29/13 1013  BP: 120/67  Pulse: 70   the general appearance reveals a alert elderly woman in no distress.The head and neck exam reveals pupils equal and reactive.  Extraocular movements are full.  There is no scleral icterus.  The mouth and pharynx are normal.  The neck is supple.  The carotids reveal no bruits.  The jugular venous pressure is normal.  The  thyroid is not enlarged.  There is no lymphadenopathy.  The chest is clear to percussion and auscultation.  There are no rales or rhonchi.  Expansion of the chest is symmetrical.  The precordium is quiet.  The first heart sound is normal.  The second heart sound is physiologically  split.  There is no  gallop rub or click.  There is a grade 2/6 holosystolic murmur mitral regurgitation at apex.  There is no abnormal lift or heave.  The abdomen is soft and nontender.  The bowel sounds are normal.  The liver and spleen are not enlarged.  There are no abdominal masses.  There are no abdominal bruits.  Extremities reveal good pedal pulses.  There is no phlebitis or edema.  There is no cyanosis or clubbing.  Strength is normal and symmetrical in all extremities.  There is no lateralizing weakness.  There are no sensory deficits.  The skin is warm and dry.  There is no rash.  Assessment / Plan: Continue same medication.  Recheck in 4 months for office visit

## 2013-03-29 NOTE — Assessment & Plan Note (Signed)
Her vision appears to have stabilized with the use of special prism glasses given to her for her diplopia

## 2013-03-29 NOTE — Assessment & Plan Note (Signed)
The patient has known mitral regurgitation.  She has not had any increase in symptoms of heart failure.  She denies orthopnea or paroxysmal nocturnal dyspnea.

## 2013-03-29 NOTE — Assessment & Plan Note (Signed)
The patient has not been aware of any recent PVCs 

## 2013-04-18 ENCOUNTER — Other Ambulatory Visit: Payer: Self-pay | Admitting: Cardiology

## 2013-04-28 ENCOUNTER — Other Ambulatory Visit: Payer: Self-pay | Admitting: *Deleted

## 2013-04-28 MED ORDER — POTASSIUM CHLORIDE CRYS ER 20 MEQ PO TBCR: EXTENDED_RELEASE_TABLET | ORAL | Status: DC

## 2013-04-28 MED ORDER — FUROSEMIDE 20 MG PO TABS: ORAL_TABLET | ORAL | Status: DC

## 2013-04-28 NOTE — Telephone Encounter (Signed)
rx sent by e-scribe to [facility]

## 2013-04-28 NOTE — Telephone Encounter (Signed)
rx e-scribed to [facility]

## 2013-05-09 LAB — BASIC METABOLIC PANEL
BUN: 35 mg/dL — AB (ref 4–21)
Creatinine: 1.3 mg/dL — AB (ref 0.5–1.1)
POTASSIUM: 4.5 mmol/L (ref 3.4–5.3)
SODIUM: 140 mmol/L (ref 137–147)

## 2013-05-09 LAB — HEPATIC FUNCTION PANEL
ALT: 20 U/L (ref 7–35)
AST: 12 U/L — AB (ref 13–35)
Bilirubin, Total: 0.7 mg/dL

## 2013-05-09 LAB — TSH: TSH: 6.07 u[IU]/mL — AB (ref 0.41–5.90)

## 2013-05-18 ENCOUNTER — Encounter: Payer: Self-pay | Admitting: Internal Medicine

## 2013-05-18 ENCOUNTER — Non-Acute Institutional Stay: Payer: Medicare Other | Admitting: Internal Medicine

## 2013-05-18 VITALS — BP 126/72 | HR 76 | Temp 97.4°F | Resp 20 | Wt 174.0 lb

## 2013-05-18 DIAGNOSIS — H8110 Benign paroxysmal vertigo, unspecified ear: Secondary | ICD-10-CM

## 2013-05-18 DIAGNOSIS — I10 Essential (primary) hypertension: Secondary | ICD-10-CM

## 2013-05-18 DIAGNOSIS — I5032 Chronic diastolic (congestive) heart failure: Secondary | ICD-10-CM

## 2013-05-18 DIAGNOSIS — N183 Chronic kidney disease, stage 3 unspecified: Secondary | ICD-10-CM

## 2013-05-18 DIAGNOSIS — K2100 Gastro-esophageal reflux disease with esophagitis, without bleeding: Secondary | ICD-10-CM

## 2013-05-18 DIAGNOSIS — R269 Unspecified abnormalities of gait and mobility: Secondary | ICD-10-CM

## 2013-05-18 DIAGNOSIS — I509 Heart failure, unspecified: Secondary | ICD-10-CM

## 2013-05-18 DIAGNOSIS — H00019 Hordeolum externum unspecified eye, unspecified eyelid: Secondary | ICD-10-CM

## 2013-05-18 DIAGNOSIS — H532 Diplopia: Secondary | ICD-10-CM

## 2013-05-18 DIAGNOSIS — E039 Hypothyroidism, unspecified: Secondary | ICD-10-CM

## 2013-05-18 MED ORDER — LOSARTAN POTASSIUM 25 MG PO TABS: ORAL_TABLET | ORAL | Status: DC

## 2013-05-18 NOTE — Progress Notes (Signed)
Patient ID: Ann [name], female   DOB: 1918-01-29, 80 y.o.   MRN: [id number]    Location:  [facility]   Place of Service: OFFICE    Allergies  Allergen Reactions  . Protonix [Pantoprazole Sodium] Rash  . Ace Inhibitors Cough  . Codeine   . Darvon   . Mercurochrome [Merbromin (Mercurochrome)]   . Pantoprazole     Dizzy  . Sulfa Drugs Cross Reactors   . Tramadol     Abnormal kidney function  . Vesicare [Solifenacin]     Drys out nose and throat     Chief Complaint  Patient presents with  . Medical Management of Chronic Issues    HPI:  Stye external: Small cell in the right lower. Appears to be improving  Chronic kidney disease, stage III (moderate) - stable  Abnormality of gait: Unchanged  Hypertension - stable  Chronic diastolic congestive heart failure:  compensated:  Hypothyroidism -  compensated  Diplopia:  wearing prisms  Benign paroxysmal positional vertigo: No recent episodes  Reflux esophagitis: Symptomatically improved    Medications: Patient's Medications  New Prescriptions   No medications on file  Previous Medications   ACETAMINOPHEN (TYLENOL) 500 MG TABLET    Take 500 mg by mouth as needed for pain (takes 2 per day or prn).   ASPIRIN 81 MG TABLET    Take 81 mg by mouth daily.     CALCIUM & MAGNESIUM CARBONATES (MYLANTA PO)    Take by mouth as needed.     CETIRIZINE HCL (ZYRTEC PO)    Take by mouth as needed.     CHOLECALCIFEROL (VITAMIN D PO)    Take by mouth. Taking 1000 daily    FLUVIRIN INJ INJECTION       FUROSEMIDE (LASIX) 20 MG TABLET    TAKE 1 TABLET ONCE DAILY TO PREVENT EDEMA.   LEVOTHYROXINE (SYNTHROID, LEVOTHROID) 88 MCG TABLET    Take one tablet daily for thyroid supplement   LOSARTAN (COZAAR) 25 MG TABLET    TAKE 1 TABLET ONCE DAILY.   MECLIZINE (ANTIVERT) 25 MG TABLET    Take 25 mg by mouth as directed.   METOPROLOL TARTRATE (LOPRESSOR) 25 MG TABLET    TAKE (1/2) TABLET DAILY.   MULTIVITAMIN (THERAGRAN) PER TABLET    Take 1 tablet  by mouth daily.     NYSTATIN-TRIAMCINOLONE (MYCOLOG II) CREAM    APPLY TO RASH TWICE A DAY FOR 2 WEEKS.   POTASSIUM CHLORIDE SA (K-DUR,KLOR-CON) 20 MEQ TABLET    TAKE 1 TABLET EACH DAY.   SALINE NASAL SPRAY NA    by Nasal route as needed.     TRIAMCINOLONE (KENALOG) 0.1 % LOTION    Apply 1 application topically as needed.    Modified Medications   No medications on file  Discontinued Medications   NYSTATIN PO    Take by mouth as needed.       Review of Systems  Constitutional: Negative.   HENT: Positive for hearing loss.   Eyes: Negative.   Respiratory: Negative.   Cardiovascular: Negative.   Gastrointestinal: Negative.   Genitourinary:       Stress incontinence.  Musculoskeletal: Positive for gait problem.       Chronic knee pains. Uses a walker.  Skin: Negative.   Allergic/Immunologic: Negative.   Neurological: Negative.   Hematological: Negative.   Psychiatric/Behavioral:       Depressive symptoms come and go.    Filed Vitals:   05/18/13 1418  BP: 126/72  Pulse: 76  Temp: 97.4 F (36.3 C)  TempSrc: Oral  Resp: 20  Weight: 174 lb (78.926 kg)   Body mass index is 30.83 kg/(m^2).  Physical Exam  Constitutional: She is oriented to person, place, and time. She appears well-developed and well-nourished. No distress.  HENT:  Head: Normocephalic and atraumatic.  Right Ear: External ear normal.  Left Ear: External ear normal.  Nose: Nose normal.  Mouth/Throat: Oropharynx is clear and moist.  Loss of hearing bilaterally.  Eyes: Conjunctivae and EOM are normal. Pupils are equal, round, and reactive to light.  Corrective lenses and prisms on her glasses.  Neck: No JVD present. No tracheal deviation present. No thyromegaly present.  Cardiovascular:  Regular rhythm. Grade 3/6 systolic ejection murmur at left sternal border. No heaves or thrills on palpation. Dorsalis pedis and posterior tibial arteries have diminished pulses bilaterally. Varicose leg veins bilaterally.    Abdominal: She exhibits no distension and no mass. There is no tenderness.  Musculoskeletal: She exhibits no edema.  Crepitance in both knees.  Lymphadenopathy:    She has no cervical adenopathy.  Neurological: She is alert and oriented to person, place, and time. No cranial nerve deficit. Coordination normal.  Reduced vibratory sensation in the right leg and foot.  Skin: No rash noted. No erythema. No pallor.  Psychiatric: She has a normal mood and affect. Her behavior is normal. Judgment and thought content normal.     Labs reviewed: Nursing Home on 05/18/2013  Component Date Value Ref Range Status  . BUN 05/09/2013 35* 4 - 21 mg/dL Final  . Creatinine 05/09/2013 1.3* 0.5 - 1.1 mg/dL Final  . Potassium 05/09/2013 4.5  3.4 - 5.3 mmol/L Final  . Sodium 05/09/2013 140  137 - 147 mmol/L Final  . ALT 05/09/2013 20  7 - 35 U/L Final  . AST 05/09/2013 12* 13 - 35 U/L Final  . Bilirubin, Total 05/09/2013 0.7   Final  . TSH 05/09/2013 6.07* 0.41 - 5.90 uIU/mL Final   11/16/12 MRI brain at [facility] imaging: normal   Assessment/Plan  1. Stye external Continue warm compresses as needed  2. Chronic kidney disease, stage III (moderate) Stable - Basic metabolic panel; Future  3. Abnormality of gait Unchanged  4. Hypertension Stable - losartan (COZAAR) 25 MG tablet; One daily to control BP  Dispense: 30 tablet; Refill: 5 - Basic metabolic panel; Future  5. Chronic diastolic congestive heart failure Compensated  6. Hypothyroidism Recheck next visit; TSH is slightly high when last checked. - TSH; Future  7. Diplopia Compensated by using present  8. Benign paroxysmal positional vertigo In remission  9. Reflux esophagitis Asymptomatic

## 2013-06-14 ENCOUNTER — Encounter: Payer: Self-pay | Admitting: Internal Medicine

## 2013-06-27 ENCOUNTER — Other Ambulatory Visit: Payer: Self-pay | Admitting: Internal Medicine

## 2013-07-13 ENCOUNTER — Non-Acute Institutional Stay: Payer: Medicare Other | Admitting: Internal Medicine

## 2013-07-13 ENCOUNTER — Encounter: Payer: Self-pay | Admitting: Internal Medicine

## 2013-07-13 DIAGNOSIS — N952 Postmenopausal atrophic vaginitis: Secondary | ICD-10-CM

## 2013-07-13 MED ORDER — ESTROGENS, CONJUGATED 0.625 MG/GM VA CREA: TOPICAL_CREAM | VAGINAL | Status: DC

## 2013-07-13 NOTE — Progress Notes (Signed)
Patient ID: Ann [name], female   DOB: 04-03-1918, 80 y.o.   MRN: [id number]    Location:  [facility]   Place of Service: Clinic (12)    Allergies  Allergen Reactions  . Protonix [Pantoprazole Sodium] Rash  . Ace Inhibitors Cough  . Codeine   . Darvon   . Mercurochrome [Merbromin (Mercurochrome)]   . Pantoprazole     Dizzy  . Sulfa Drugs Cross Reactors   . Tramadol     Abnormal kidney function  . Vesicare [Solifenacin]     Drys out nose and throat     Chief Complaint  Patient presents with  . burning in bottom/vaginal area    for 3-4 weeks, feels raw. Been using vaseline jelly, Ultra healing lotiion, Nystatin cream. Had Urine test done 07/05/13 it was negative    HPI:  Atrophic vaginitis -burning in the perirectal area. Feels raw. Symptomatic for 3-4 weeks.using Vaseline, but not getting better. Has also used Vaseline ultra healing jelly and nystatin cream.    Medications: Patient's Medications  New Prescriptions   No medications on file  Previous Medications   ACETAMINOPHEN (TYLENOL) 500 MG TABLET    Take 500 mg by mouth as needed for pain (takes 2 per day or prn).   ASPIRIN 81 MG TABLET    Take 81 mg by mouth daily.     CALCIUM & MAGNESIUM CARBONATES (MYLANTA PO)    Take by mouth as needed.     CETIRIZINE HCL (ZYRTEC PO)    Take by mouth as needed.     CHOLECALCIFEROL (VITAMIN D PO)    Take by mouth. Taking 1000 daily    FLUVIRIN INJ INJECTION       FUROSEMIDE (LASIX) 20 MG TABLET    TAKE 1 TABLET ONCE DAILY TO PREVENT EDEMA.   LEVOTHYROXINE (SYNTHROID, LEVOTHROID) 88 MCG TABLET    TAKE (1) TABLET DAILY FOR THYROID.   LOSARTAN (COZAAR) 25 MG TABLET    One daily to control BP   MECLIZINE (ANTIVERT) 25 MG TABLET    Take 25 mg by mouth as directed.   METOPROLOL TARTRATE (LOPRESSOR) 25 MG TABLET    TAKE (1/2) TABLET DAILY.   MULTIVITAMIN (THERAGRAN) PER TABLET    Take 1 tablet by mouth daily.     NYSTATIN-TRIAMCINOLONE (MYCOLOG II) CREAM    APPLY TO RASH  TWICE A DAY FOR 2 WEEKS.   POTASSIUM CHLORIDE SA (K-DUR,KLOR-CON) 20 MEQ TABLET    TAKE 1 TABLET EACH DAY.   SALINE NASAL SPRAY NA    by Nasal route as needed.     TRIAMCINOLONE (KENALOG) 0.1 % LOTION    Apply 1 application topically as needed.    Modified Medications   No medications on file  Discontinued Medications   No medications on file     Review of Systems  Constitutional: Negative.   HENT: Positive for hearing loss.   Eyes: Negative.   Respiratory: Negative.   Cardiovascular: Negative.   Gastrointestinal: Negative.   Genitourinary:       Stress incontinence.  Musculoskeletal: Positive for gait problem.       Chronic knee pains. Uses a walker.  Skin:       Perirectal and labial burning  Allergic/Immunologic: Negative.   Neurological: Negative.   Hematological: Negative.   Psychiatric/Behavioral:       Depressive symptoms come and go.    There were no vitals filed for this visit. There is no weight on file to calculate BMI.  Physical Exam  Constitutional: She is oriented to person, place, and time. She appears well-developed and well-nourished. No distress.  HENT:  Head: Normocephalic and atraumatic.  Right Ear: External ear normal.  Left Ear: External ear normal.  Nose: Nose normal.  Mouth/Throat: Oropharynx is clear and moist.  Loss of hearing bilaterally.  Eyes: Conjunctivae and EOM are normal. Pupils are equal, round, and reactive to light.  Corrective lenses and prisms on her glasses.  Neck: No JVD present. No tracheal deviation present. No thyromegaly present.  Cardiovascular:  Regular rhythm. Grade 3/6 systolic ejection murmur at left sternal border. No heaves or thrills on palpation. Dorsalis pedis and posterior tibial arteries have diminished pulses bilaterally. Varicose leg veins bilaterally.  Abdominal: She exhibits no distension and no mass. There is no tenderness.  Genitourinary:  Perirectal and labial atrophy and irritation  Musculoskeletal:  She exhibits no edema.  Crepitance in both knees.  Lymphadenopathy:    She has no cervical adenopathy.  Neurological: She is alert and oriented to person, place, and time. No cranial nerve deficit. Coordination normal.  Reduced vibratory sensation in the right leg and foot.  Skin: No rash noted. No erythema. No pallor.  Psychiatric: She has a normal mood and affect. Her behavior is normal. Judgment and thought content normal.     Labs reviewed: Nursing Home on 05/18/2013  Component Date Value Ref Range Status  . BUN 05/09/2013 35* 4 - 21 mg/dL Final  . Creatinine 05/09/2013 1.3* 0.5 - 1.1 mg/dL Final  . Potassium 05/09/2013 4.5  3.4 - 5.3 mmol/L Final  . Sodium 05/09/2013 140  137 - 147 mmol/L Final  . ALT 05/09/2013 20  7 - 35 U/L Final  . AST 05/09/2013 12* 13 - 35 U/L Final  . Bilirubin, Total 05/09/2013 0.7   Final  . TSH 05/09/2013 6.07* 0.41 - 5.90 uIU/mL Final      Assessment/Plan  Atrophic vaginitis  - Plan: conjugated estrogens (PREMARIN) vaginal cream

## 2013-07-27 ENCOUNTER — Encounter: Payer: Self-pay | Admitting: Cardiology

## 2013-07-27 ENCOUNTER — Ambulatory Visit (INDEPENDENT_AMBULATORY_CARE_PROVIDER_SITE_OTHER): Payer: Medicare Other | Admitting: Cardiology

## 2013-07-27 VITALS — BP 130/66 | HR 66 | Ht 63.0 in | Wt 174.8 lb

## 2013-07-27 DIAGNOSIS — I493 Ventricular premature depolarization: Secondary | ICD-10-CM

## 2013-07-27 DIAGNOSIS — R002 Palpitations: Secondary | ICD-10-CM

## 2013-07-27 DIAGNOSIS — I119 Hypertensive heart disease without heart failure: Secondary | ICD-10-CM

## 2013-07-27 DIAGNOSIS — I509 Heart failure, unspecified: Secondary | ICD-10-CM

## 2013-07-27 DIAGNOSIS — I10 Essential (primary) hypertension: Secondary | ICD-10-CM

## 2013-07-27 DIAGNOSIS — I5032 Chronic diastolic (congestive) heart failure: Secondary | ICD-10-CM

## 2013-07-27 DIAGNOSIS — I4949 Other premature depolarization: Secondary | ICD-10-CM

## 2013-07-27 DIAGNOSIS — I340 Nonrheumatic mitral (valve) insufficiency: Secondary | ICD-10-CM

## 2013-07-27 DIAGNOSIS — I059 Rheumatic mitral valve disease, unspecified: Secondary | ICD-10-CM

## 2013-07-27 NOTE — Assessment & Plan Note (Signed)
The patient has chronic orthopnea.  She states on 2 pillows.  She has not had any paroxysmal nocturnal dyspnea.  She has had some mild chronic peripheral edema.

## 2013-07-27 NOTE — Assessment & Plan Note (Signed)
She has a murmur of mitral regurgitation.  She does have moderate exertional dyspnea but it has not worsened since last visit.

## 2013-07-27 NOTE — Assessment & Plan Note (Signed)
The patient has not been aware of any recent palpitations or tachycardia. 

## 2013-07-27 NOTE — Progress Notes (Signed)
Ann [name] Date of Birth:  1918/02/12 [address], [place]  [address] [phone]        Fax   [phone]   History of Present Illness: This pleasant 80 year old woman is seen for a scheduled followup office visit. She has a past history of palpitations and supraventricular tachycardia. She also has a history of mitral regurgitation. She had an echocardiogram in May 2011 which showed normal left ventricular systolic function with an ejection fraction of 55-60% and an impaired relaxation. She has had a past history of congestive heart failure secondary to diastolic dysfunction. She does not have any history of ischemic heart disease. She has a history of arthritis and previously had been on tramadol which was recently stopped by her primary care physician because of worsening renal function. She was seen as a work in September 2013 complaining of palpitations and she wore a 30 day monitor from 09/09/11 until 10/08/11 and the monitor did not show any significant arrhythmias. She was in normal sinus rhythm with occasional PVCs and PACs.  She has painful arthritis of the right knee. Dr. Louanne Skye is her orthopedist.    Current Outpatient Prescriptions  Medication Sig Dispense Refill  . acetaminophen (TYLENOL) 500 MG tablet Take 500 mg by mouth as needed for pain (takes 2 per day or prn).      Marland Kitchen aspirin 81 MG tablet Take 81 mg by mouth daily.        . Calcium & Magnesium Carbonates (MYLANTA PO) Take by mouth as needed.        . Cholecalciferol (VITAMIN D PO) Take by mouth. Taking 1000 daily       . FLUVIRIN INJ injection       . furosemide (LASIX) 20 MG tablet TAKE 1 TABLET ONCE DAILY TO PREVENT EDEMA.  30 tablet  3  . levothyroxine (SYNTHROID, LEVOTHROID) 88 MCG tablet TAKE (1) TABLET DAILY FOR THYROID.  30 tablet  3  . losartan (COZAAR) 25 MG tablet One daily to control BP  30 tablet  5  . metoprolol tartrate (LOPRESSOR) 25 MG tablet TAKE (1/2)  TABLET DAILY.  30 tablet  4  . multivitamin (THERAGRAN) per tablet Take 1 tablet by mouth daily.        Marland Kitchen nystatin-triamcinolone (MYCOLOG II) cream APPLY TO RASH TWICE A DAY FOR 2 WEEKS.  30 g  5  . potassium chloride SA (K-DUR,KLOR-CON) 20 MEQ tablet TAKE 1 TABLET EACH DAY.  30 tablet  3  . SALINE NASAL SPRAY NA by Nasal route as needed.        . triamcinolone (KENALOG) 0.1 % lotion Apply 1 application topically as needed.         No current facility-administered medications for this visit.    Allergies  Allergen Reactions  . Protonix [Pantoprazole Sodium] Rash  . Ace Inhibitors Cough  . Codeine   . Darvon   . Mercurochrome [Merbromin (Mercurochrome)]   . Pantoprazole     Dizzy  . Sulfa Drugs Cross Reactors   . Tramadol     Abnormal kidney function  . Vesicare [Solifenacin]     Drys out nose and throat     Patient Active Problem List   Diagnosis Date Noted  . Mitral regurgitation 04/17/2010    Priority: Medium  . Palpitations 04/17/2010    Priority: Medium  . Spinal stenosis, lumbar 04/17/2010    Priority: Medium  . Atrophic vaginitis 07/13/2013  . Stye  external 05/18/2013  . Stomatitis 12/22/2012  . Unspecified urinary incontinence 09/22/2012  . Hypertension   . Diplopia 09/12/2012  . Pain in right knee 08/08/2012  . Chronic kidney disease, stage III (moderate) 08/08/2012  . PVC's (premature ventricular contractions) 09/08/2011  . Benign paroxysmal positional vertigo 07/24/2010  . Chronic diastolic congestive heart failure 04/17/2010  . Hypothyroidism 04/17/2010  . Abnormality of gait 05/09/2009  . Reflux esophagitis 01/06/1996  . Peptic ulcer, unspecified site, unspecified as acute or chronic, without mention of hemorrhage, perforation, or obstruction 01/06/1996    History  Smoking status  . Never Smoker   Smokeless tobacco  . Never Used    History  Alcohol Use No    Family History  Problem Relation Age of Onset  . Stroke Mother   . Heart disease  Father   . Diabetes Father   . Heart disease Brother     MI  . Alcohol abuse Sister     Review of Systems: Constitutional: no fever chills diaphoresis or fatigue or change in weight.  Head and neck: no hearing loss, no epistaxis, no photophobia or visual disturbance. Respiratory: No cough, shortness of breath or wheezing. Cardiovascular: No chest pain peripheral edema, palpitations. Gastrointestinal: No abdominal distention, no abdominal pain, no change in bowel habits hematochezia or melena. Genitourinary: No dysuria, no frequency, no urgency, no nocturia. Musculoskeletal:No arthralgias, no back pain, no gait disturbance or myalgias. Neurological: No dizziness, no headaches, no numbness, no seizures, no syncope, no weakness, no tremors. Hematologic: No lymphadenopathy, no easy bruising. Psychiatric: No confusion, no hallucinations, no sleep disturbance.    Physical Exam: Filed Vitals:   07/27/13 1034  BP: 130/66  Pulse: 66   General appearance reveals an alert elderly woman in no distress.  She is dyspneic lying flat.The head and neck exam reveals pupils equal and reactive.  Extraocular movements are full.  There is no scleral icterus.  The mouth and pharynx are normal.  The neck is supple.  The carotids reveal no bruits.  The jugular venous pressure is normal.  The  thyroid is not enlarged.  There is no lymphadenopathy.  The chest is clear to percussion and auscultation.  There are no rales or rhonchi.  Expansion of the chest is symmetrical.  The precordium is quiet.  The first heart sound is normal.  The second heart sound is physiologically split.  There is grade 2/6 apical holosystolic murmur of mitral regurgitation.  There is no abnormal lift or heave.  The abdomen is soft and nontender.  The bowel sounds are normal.  The liver and spleen are not enlarged.  There are no abdominal masses.  There are no abdominal bruits.  Extremities reveal good pedal pulses.  There is no phlebitis or  edema.  There is no cyanosis or clubbing.  Strength is normal and symmetrical in all extremities.  There is no lateralizing weakness.  There are no sensory deficits.  The skin is warm and dry.  There is no rash.    Assessment / Plan: 1. mitral regurgitation 2. history of palpitations 3.  Left ventricular diastolic dysfunction and past history of diastolic heart failure 4.  Osteoarthritis  Disposition: Continue on same medication.  Watch diet carefully.  Her weight is up 3 pounds.  Recheck in 6 months for office visit and EKG

## 2013-07-27 NOTE — Assessment & Plan Note (Signed)
The patient has a history of essential hypertension.  She has not been having dizziness or syncope

## 2013-07-27 NOTE — Patient Instructions (Signed)
Your physician recommends that you continue on your current medications as directed. Please refer to the Current Medication list given to you today.  Your physician wants you to follow-up in: 6 month ov/ekg You will receive a reminder letter in the mail two months in advance. If you don't receive a letter, please call our office to schedule the follow-up appointment.  

## 2013-08-07 ENCOUNTER — Encounter: Payer: Self-pay | Admitting: Internal Medicine

## 2013-08-10 ENCOUNTER — Encounter: Payer: Self-pay | Admitting: Internal Medicine

## 2013-08-10 ENCOUNTER — Non-Acute Institutional Stay: Payer: Medicare Other | Admitting: Internal Medicine

## 2013-08-10 VITALS — BP 112/68 | HR 76 | Wt 174.0 lb

## 2013-08-10 DIAGNOSIS — N952 Postmenopausal atrophic vaginitis: Secondary | ICD-10-CM

## 2013-08-10 DIAGNOSIS — R32 Unspecified urinary incontinence: Secondary | ICD-10-CM

## 2013-08-10 NOTE — Progress Notes (Signed)
Patient ID: Ann [name], female   DOB: 10-Oct-1918, 80 y.o.   MRN: [id number]    Location:  [facility]   Place of Service: Clinic (12)    Allergies  Allergen Reactions  . Protonix [Pantoprazole Sodium] Rash  . Ace Inhibitors Cough  . Codeine   . Darvon   . Mercurochrome [Merbromin (Mercurochrome)]   . Pantoprazole     Dizzy  . Sulfa Drugs Cross Reactors   . Tramadol     Abnormal kidney function  . Vesicare [Solifenacin]     Drys out nose and throat     Chief Complaint  Patient presents with  . Medical Management of Chronic Issues    follow-up on vaginitis.     HPI:  Atrophic vaginitis:improved.less symptomatic.  Unspecified urinary incontinence: persists    Medications: Patient's Medications  New Prescriptions   No medications on file  Previous Medications   ACETAMINOPHEN (TYLENOL) 500 MG TABLET    Take 500 mg by mouth as needed for pain (takes 2 per day or prn).   ASPIRIN 81 MG TABLET    Take 81 mg by mouth daily.     CALCIUM & MAGNESIUM CARBONATES (MYLANTA PO)    Take by mouth as needed.     CHOLECALCIFEROL (VITAMIN D PO)    Take by mouth. Taking 1000 daily    FLUVIRIN INJ INJECTION       FUROSEMIDE (LASIX) 20 MG TABLET    TAKE 1 TABLET ONCE DAILY TO PREVENT EDEMA.   LEVOTHYROXINE (SYNTHROID, LEVOTHROID) 88 MCG TABLET    TAKE (1) TABLET DAILY FOR THYROID.   LOSARTAN (COZAAR) 25 MG TABLET    One daily to control BP   METOPROLOL TARTRATE (LOPRESSOR) 25 MG TABLET    TAKE (1/2) TABLET DAILY.   MULTIVITAMIN (THERAGRAN) PER TABLET    Take 1 tablet by mouth daily.     NYSTATIN-TRIAMCINOLONE (MYCOLOG II) CREAM    APPLY TO RASH TWICE A DAY FOR 2 WEEKS.   POTASSIUM CHLORIDE SA (K-DUR,KLOR-CON) 20 MEQ TABLET    TAKE 1 TABLET EACH DAY.   PREMARIN VAGINAL CREAM       SALINE NASAL SPRAY NA    by Nasal route as needed.     TRIAMCINOLONE (KENALOG) 0.1 % LOTION    Apply 1 application topically as needed.    Modified Medications   No medications on file    Discontinued Medications   No medications on file     Review of Systems  Constitutional: Negative.   HENT: Positive for hearing loss.   Eyes: Negative.   Respiratory: Negative.   Cardiovascular: Negative.   Gastrointestinal: Negative.   Genitourinary: Negative for vaginal pain.       Stress incontinence.  Musculoskeletal: Positive for gait problem.       Chronic knee pains. Uses a walker.  Skin:       Improved perirectal and labial burning  Allergic/Immunologic: Negative.   Neurological: Negative.   Hematological: Negative.   Psychiatric/Behavioral:       Depressive symptoms come and go.    Filed Vitals:   08/10/13 1542  BP: 112/68  Pulse: 76  Weight: 174 lb (78.926 kg)   Body mass index is 30.83 kg/(m^2).  Physical Exam  Constitutional: She is oriented to person, place, and time. She appears well-developed and well-nourished. No distress.  HENT:  Head: Normocephalic and atraumatic.  Right Ear: External ear normal.  Left Ear: External ear normal.  Nose: Nose normal.  Mouth/Throat: Oropharynx  is clear and moist.  Loss of hearing bilaterally.  Eyes: Conjunctivae and EOM are normal. Pupils are equal, round, and reactive to light.  Corrective lenses and prisms on her glasses.  Neck: No JVD present. No tracheal deviation present. No thyromegaly present.  Cardiovascular:  Regular rhythm. Grade 3/6 systolic ejection murmur at left sternal border. No heaves or thrills on palpation. Dorsalis pedis and posterior tibial arteries have diminished pulses bilaterally. Varicose leg veins bilaterally.  Abdominal: She exhibits no distension and no mass. There is no tenderness.  Genitourinary:   labial atrophy  Musculoskeletal: She exhibits no edema.  Crepitance in both knees.  Lymphadenopathy:    She has no cervical adenopathy.  Neurological: She is alert and oriented to person, place, and time. No cranial nerve deficit. Coordination normal.  Reduced vibratory sensation in the  right leg and foot.  Skin: No rash noted. No erythema. No pallor.  Psychiatric: She has a normal mood and affect. Her behavior is normal. Judgment and thought content normal.     Labs reviewed: Nursing Home on 05/18/2013  Component Date Value Ref Range Status  . BUN 05/09/2013 35* 4 - 21 mg/dL Final  . Creatinine 05/09/2013 1.3* 0.5 - 1.1 mg/dL Final  . Potassium 05/09/2013 4.5  3.4 - 5.3 mmol/L Final  . Sodium 05/09/2013 140  137 - 147 mmol/L Final  . ALT 05/09/2013 20  7 - 35 U/L Final  . AST 05/09/2013 12* 13 - 35 U/L Final  . Bilirubin, Total 05/09/2013 0.7   Final  . TSH 05/09/2013 6.07* 0.41 - 5.90 uIU/mL Final      Assessment/Plan  1. Atrophic vaginitis Continue estrogen cream. May reduce to once weekly next month.  2. Unspecified urinary incontinence no new medications

## 2013-08-15 NOTE — Addendum Note (Signed)
Addended by: Estill Dooms on: 08/15/2013 01:33 PM   Modules accepted: Level of Service

## 2013-09-14 LAB — BASIC METABOLIC PANEL
BUN: 44 mg/dL — AB (ref 4–21)
Creatinine: 1.2 mg/dL — AB (ref 0.5–1.1)
Glucose: 83 mg/dL
Potassium: 4.3 mmol/L (ref 3.4–5.3)
Sodium: 137 mmol/L (ref 137–147)

## 2013-09-14 LAB — TSH: TSH: 3.82 u[IU]/mL (ref 0.41–5.90)

## 2013-09-21 ENCOUNTER — Non-Acute Institutional Stay: Payer: Medicare Other | Admitting: Internal Medicine

## 2013-09-21 ENCOUNTER — Encounter: Payer: Self-pay | Admitting: Internal Medicine

## 2013-09-21 VITALS — BP 110/60 | HR 60 | Wt 176.0 lb

## 2013-09-21 DIAGNOSIS — K279 Peptic ulcer, site unspecified, unspecified as acute or chronic, without hemorrhage or perforation: Secondary | ICD-10-CM

## 2013-09-21 DIAGNOSIS — I5032 Chronic diastolic (congestive) heart failure: Secondary | ICD-10-CM

## 2013-09-21 DIAGNOSIS — N183 Chronic kidney disease, stage 3 unspecified: Secondary | ICD-10-CM

## 2013-09-21 DIAGNOSIS — K2100 Gastro-esophageal reflux disease with esophagitis, without bleeding: Secondary | ICD-10-CM

## 2013-09-21 DIAGNOSIS — I10 Essential (primary) hypertension: Secondary | ICD-10-CM

## 2013-09-21 DIAGNOSIS — N952 Postmenopausal atrophic vaginitis: Secondary | ICD-10-CM

## 2013-09-21 DIAGNOSIS — H3530 Unspecified macular degeneration: Secondary | ICD-10-CM

## 2013-09-21 DIAGNOSIS — I509 Heart failure, unspecified: Secondary | ICD-10-CM

## 2013-09-21 DIAGNOSIS — E039 Hypothyroidism, unspecified: Secondary | ICD-10-CM

## 2013-09-21 HISTORY — DX: Unspecified macular degeneration: H35.30

## 2013-09-21 NOTE — Progress Notes (Signed)
Patient ID: Ann [name], female   DOB: 29-Mar-1918, 80 y.o.   MRN: [id number]    Location:  [facility]   Place of Service: Clinic (12)    Allergies  Allergen Reactions  . Protonix [Pantoprazole Sodium] Rash  . Ace Inhibitors Cough  . Codeine   . Darvon   . Mercurochrome [Merbromin (Mercurochrome)]   . Pantoprazole     Dizzy  . Sulfa Drugs Cross Reactors   . Tramadol     Abnormal kidney function  . Vesicare [Solifenacin]     Drys out nose and throat     Chief Complaint  Patient presents with  . Medical Management of Chronic Issues    blood pressure, CHF, thyroid, CKD    HPI:  Essential hypertension: CONTROLLED  Chronic diastolic congestive heart failure: COMPENSATED  Hypothyroidism, unspecified hypothyroidism type: COMPENSATED  Chronic kidney disease, stage III (moderate): STABLE  Atrophic vaginitis: improved on Premarin cream twice weekly  Reflux esophagitis: asymptomatic off PPI  Macular degeneration: followed by ophth  Peptic ulcer, unspecified site, unspecified as acute or chronic, without mention of hemorrhage, perforation, or obstruction: symptomatic off PPI    Medications: Patient's Medications  New Prescriptions   No medications on file  Previous Medications   ACETAMINOPHEN (TYLENOL) 500 MG TABLET    Take 500 mg by mouth as needed for pain (takes 2 per day or prn).   ASPIRIN 81 MG TABLET    Take 81 mg by mouth daily.     CALCIUM & MAGNESIUM CARBONATES (MYLANTA PO)    Take by mouth as needed.     CETIRIZINE (ZYRTEC) 10 MG TABLET    Take 10 mg by mouth daily.   CHOLECALCIFEROL (VITAMIN D PO)    Take by mouth. Taking 1000 daily    FLUVIRIN INJ INJECTION       FUROSEMIDE (LASIX) 20 MG TABLET    TAKE 1 TABLET ONCE DAILY TO PREVENT EDEMA.   LEVOTHYROXINE (SYNTHROID, LEVOTHROID) 88 MCG TABLET    TAKE (1) TABLET DAILY FOR THYROID.   LOSARTAN (COZAAR) 25 MG TABLET    One daily to control BP   MECLIZINE (ANTIVERT) 25 MG TABLET    Take 25 mg by  mouth. Take one as needed for dizziness   METOPROLOL TARTRATE (LOPRESSOR) 25 MG TABLET    TAKE (1/2) TABLET DAILY.   MULTIVITAMIN (THERAGRAN) PER TABLET    Take 1 tablet by mouth daily.     POTASSIUM CHLORIDE SA (K-DUR,KLOR-CON) 20 MEQ TABLET    TAKE 1 TABLET EACH DAY.   PREMARIN VAGINAL CREAM    Use twice a week   TRIAMCINOLONE (KENALOG) 0.1 % LOTION    Apply 1 application topically as needed.    Modified Medications   No medications on file  Discontinued Medications   NYSTATIN-TRIAMCINOLONE (MYCOLOG II) CREAM    APPLY TO RASH TWICE A DAY FOR 2 WEEKS.   SALINE NASAL SPRAY NA    by Nasal route as needed.       Review of Systems  Constitutional: Negative.   HENT: Positive for hearing loss.   Eyes: Negative.   Respiratory: Negative.   Cardiovascular: Negative.   Gastrointestinal: Negative.   Genitourinary: Negative for vaginal pain.       Stress incontinence.  Musculoskeletal: Positive for gait problem.       Chronic knee pains. Uses a walker.  Skin:       Improved perirectal and labial burning  Allergic/Immunologic: Negative.   Neurological: Negative.  Hematological: Negative.   Psychiatric/Behavioral:       Depressive symptoms come and go.    Filed Vitals:   09/21/13 1336  BP: 110/60  Pulse: 60  Weight: 176 lb (79.833 kg)   Body mass index is 31.18 kg/(m^2).  Physical Exam  Constitutional: She is oriented to person, place, and time. She appears well-developed and well-nourished. No distress.  HENT:  Head: Normocephalic and atraumatic.  Right Ear: External ear normal.  Left Ear: External ear normal.  Nose: Nose normal.  Mouth/Throat: Oropharynx is clear and moist.  Loss of hearing bilaterally.  Eyes: Conjunctivae and EOM are normal. Pupils are equal, round, and reactive to light.  Corrective lenses and prisms on her glasses.  Neck: No JVD present. No tracheal deviation present. No thyromegaly present.  Cardiovascular:  Regular rhythm. Grade 3/6 systolic ejection  murmur at left sternal border. No heaves or thrills on palpation. Dorsalis pedis and posterior tibial arteries have diminished pulses bilaterally. Varicose leg veins bilaterally.  Abdominal: She exhibits no distension and no mass. There is no tenderness.  Genitourinary:   labial atrophy  Musculoskeletal: She exhibits no edema.  Crepitance in both knees.  Lymphadenopathy:    She has no cervical adenopathy.  Neurological: She is alert and oriented to person, place, and time. No cranial nerve deficit. Coordination normal.  Reduced vibratory sensation in the right leg and foot.  Skin: No rash noted. No erythema. No pallor.  Psychiatric: She has a normal mood and affect. Her behavior is normal. Judgment and thought content normal.     Labs reviewed: Nursing Home on 09/21/2013  Component Date Value Ref Range Status  . Glucose 09/14/2013 83   Final  . BUN 09/14/2013 44* 4 - 21 mg/dL Final  . Creatinine 09/14/2013 1.2* 0.5 - 1.1 mg/dL Final  . Potassium 09/14/2013 4.3  3.4 - 5.3 mmol/L Final  . Sodium 09/14/2013 137  137 - 147 mmol/L Final  . TSH 09/14/2013 3.82  0.41 - 5.90 uIU/mL Final    Assessment/Plan  1. Essential hypertension -BMP  2. Chronic diastolic congestive heart failure CONTROLLED  3. Hypothyroidism, unspecified hypothyroidism type Compensated -TSH  4. Chronic kidney disease, stage III (moderate) Stable -BMP  5. Atrophic vaginitis IMPROVED  6. Reflux esophagitis Asymptomatic off PPI  7. Macular degeneration Followed by ophth  8. Peptic ulcer, unspecified site, unspecified as acute or chronic, without mention of hemorrhage, perforation, or obstruction Asymptomatic off PPI

## 2013-09-29 ENCOUNTER — Encounter: Payer: Self-pay | Admitting: Internal Medicine

## 2013-10-26 ENCOUNTER — Other Ambulatory Visit: Payer: Self-pay | Admitting: Internal Medicine

## 2013-12-25 ENCOUNTER — Other Ambulatory Visit: Payer: Self-pay | Admitting: Cardiology

## 2014-01-30 LAB — BASIC METABOLIC PANEL
BUN: 41 mg/dL — AB (ref 4–21)
Creatinine: 1.1 mg/dL (ref 0.5–1.1)
GLUCOSE: 82 mg/dL
Potassium: 4.6 mmol/L (ref 3.4–5.3)
SODIUM: 141 mmol/L (ref 137–147)

## 2014-02-01 ENCOUNTER — Encounter: Payer: Self-pay | Admitting: Cardiology

## 2014-02-01 ENCOUNTER — Ambulatory Visit (INDEPENDENT_AMBULATORY_CARE_PROVIDER_SITE_OTHER): Payer: Medicare Other | Admitting: Cardiology

## 2014-02-01 VITALS — BP 124/68 | HR 65 | Ht 63.0 in | Wt 170.0 lb

## 2014-02-01 DIAGNOSIS — I10 Essential (primary) hypertension: Secondary | ICD-10-CM

## 2014-02-01 NOTE — Patient Instructions (Signed)
Your physician recommends that you continue on your current medications as directed. Please refer to the Current Medication list given to you today.  Your physician wants you to follow-up in: 6 MONTH OV  You will receive a reminder letter in the mail two months in advance. If you don't receive a letter, please call our office to schedule the follow-up appointment.  

## 2014-02-01 NOTE — Progress Notes (Signed)
Cardiology Office Note   Date:  02/01/2014   ID:  Ann [name], DOB Apr 19, 1918, MRN [id number]  PCP:  Estill Dooms, MD  Cardiologist:   Darlin Coco, MD   No chief complaint on file.     History of Present Illness: Ann [name] is a 81 y.o. female who presents for 6 month follow-up office visit  This pleasant 81 year old woman is seen for a scheduled followup office visit. She has a past history of palpitations and supraventricular tachycardia. She also has a history of mitral regurgitation. She had an echocardiogram in May 2011 which showed normal left ventricular systolic function with an ejection fraction of 55-60% and an impaired relaxation. She has had a past history of congestive heart failure secondary to diastolic dysfunction. She does not have any history of ischemic heart disease. She has a history of arthritis and previously had been on tramadol which was recently stopped by her primary care physician because of worsening renal function. She was seen as a work in September 2013 complaining of palpitations and she wore a 30 day monitor from 09/09/11 until 10/08/11 and the monitor did not show any significant arrhythmias. She was in normal sinus rhythm with occasional PVCs and PACs.  She has painful arthritis of the right knee. Dr. Louanne Skye is her orthopedist. Since last visit she has had no new cardiac symptoms.  She has had worsening problems with her incontinence.  She has also been having problems with her right arm and shoulder.  She dislocated her shoulder about 30 years ago.   Past Medical History  Diagnosis Date  . Mitral regurgitation   . Hypertension   . Palpitations   . Nocturnal dyspnea   . Advanced age   . Diastolic heart failure     Last echo in 5/11  . Pain in joint, lower leg 02/25/2012  . Other abnormal blood chemistry 02/25/2012  . Pain in joint, lower leg 02/23/2012  . Encounter for long-term (current) use of other medications 10/02/2011  . Cough  07/17/2011  . Internal hemorrhoids without mention of complication 83/66/2947  . Unspecified constipation 01/01/2011  . Rash and other nonspecific skin eruption 08/14/2010  . Tension headache 07/31/2010  . Benign paroxysmal positional vertigo 07/24/2010  . Cervicalgia 07/24/2010  . Urinary tract infection, site not specified 07/10/2010  . Abnormality of gait 05/09/2009  . Vitamin D deficiency 01/25/2009  . Lumbago 01/05/2008  . Cervicitis and endocervicitis 12/22/2007  . Actinic keratosis 12/22/2007  . Pain in limb 12/22/2007  . Muscle weakness (generalized) 08/04/2007  . Blepharochalasis 05/12/2007  . Insomnia, unspecified 05/12/2007  . Dermatophytosis of foot 07/14/2006  . Dizziness and giddiness 06/25/2006  . Allergic rhinitis due to pollen 04/21/2006  . Dyskinesia of esophagus 04/12/2006  . Other malaise and fatigue 04/01/2006  . Unspecified urinary incontinence 02/10/2006  . Cramp of limb 10/07/2005  . Restless legs syndrome (RLS) 06/30/2005  . Chronic kidney disease, stage II (mild) 01/02/2005  . Unspecified hypothyroidism 09/10/2004  . Diaphragmatic hernia without mention of obstruction or gangrene 10/03/2003  . Congestive heart failure, unspecified 01/05/2002  . Unspecified venous (peripheral) insufficiency 01/05/1998  . Reflux esophagitis 01/06/1996  . Peptic ulcer, unspecified site, unspecified as acute or chronic, without mention of hemorrhage, perforation, or obstruction 01/06/1996  . Diverticulosis of colon (without mention of hemorrhage) 01/06/1996  . Pain in right knee 08/08/2012  . Macular degeneration of right eye 1980's  . Macular degeneration 09/21/2013    Past Surgical History  Procedure  Laterality Date  . Tonsillectomy and adenoidectomy  1940  . Appendectomy  1940's  . Oophorectomy  1940's  . Breast surgery  1950's    benign tumors removed  . Cataract extraction w/ intraocular lens  implant, bilateral  1982  . Lesion excision  04/2002    melanomas of scalp  Dr. Link Snuffer  . Skin cancer  excision  2005    BCC of right temple  . Cholecystectomy, laparoscopic  10/2004    Dr. Deon Pilling  . Tooth extraction  06/2008    and bone implant  Dr. Matilde Haymaker  . Colonoscopy  2000     Current Outpatient Prescriptions  Medication Sig Dispense Refill  . acetaminophen (TYLENOL) 500 MG tablet Take 500 mg by mouth as needed for pain (takes 2 per day or prn).    Marland Kitchen aspirin 81 MG tablet Take 81 mg by mouth daily.      . Calcium & Magnesium Carbonates (MYLANTA PO) Take by mouth as needed.      . cetirizine (ZYRTEC) 10 MG tablet Take 10 mg by mouth daily. Takes 1/2 tablet each day.    . Cholecalciferol (VITAMIN D PO) Take by mouth. Taking 1000 daily     . FLUVIRIN INJ injection     . furosemide (LASIX) 20 MG tablet TAKE 1 TABLET ONCE DAILY TO PREVENT EDEMA. 30 tablet 3  . levothyroxine (SYNTHROID, LEVOTHROID) 88 MCG tablet TAKE (1) TABLET DAILY FOR THYROID. 30 tablet 3  . losartan (COZAAR) 25 MG tablet One daily to control BP 30 tablet 5  . meclizine (ANTIVERT) 25 MG tablet Take 25 mg by mouth. Take one as needed for dizziness    . metoprolol tartrate (LOPRESSOR) 25 MG tablet TAKE (1/2) TABLET DAILY. 30 tablet 2  . multivitamin (THERAGRAN) per tablet Take 1 tablet by mouth daily.      . potassium chloride SA (K-DUR,KLOR-CON) 20 MEQ tablet TAKE 1 TABLET EACH DAY. 30 tablet 3  . PREMARIN vaginal cream Use twice a week    . triamcinolone (KENALOG) 0.1 % lotion Apply 1 application topically as needed.       No current facility-administered medications for this visit.    Allergies:   Protonix; Ace inhibitors; Codeine; Darvon; Mercurochrome; Pantoprazole; Sulfa drugs cross reactors; Tramadol; and Vesicare    Social History:  The patient  reports that she has never smoked. She has never used smokeless tobacco. She reports that she does not drink alcohol or use illicit drugs.   Family History:  The patient's family history includes Alcohol abuse in her sister; Diabetes in her father; Heart  disease in her brother and father; Stroke in her mother.    ROS:  Please see the history of present illness.   Otherwise, review of systems are positive for none.   All other systems are reviewed and negative.    PHYSICAL EXAM: VS:  BP 124/68 mmHg  Pulse 65  Ht 5\' 3"  (1.6 m)  Wt 170 lb (77.111 kg)  BMI 30.12 kg/m2 , BMI Body mass index is 30.12 kg/(m^2). GEN: Well nourished, well developed, in no acute distress HEENT: normal Neck: no JVD, carotid bruits, or masses Cardiac: RRR; no murmurs, rubs, or gallops,no edema  Respiratory:  clear to auscultation bilaterally, normal work of breathing GI: soft, nontender, nondistended, + BS MS: no deformity or atrophy Skin: warm and dry, no rash Neuro:  Strength and sensation are intact Psych: euthymic mood, full affect   EKG:  EKG is ordered today. The ekg  ordered today demonstrates normal sinus rhythm.  Within normal limits.   Recent Labs: 05/09/2013: ALT 20 09/14/2013: BUN 44*; Creatinine 1.2*; Potassium 4.3; Sodium 137; TSH 3.82    Lipid Panel    Component Value Date/Time   CHOL 174 06/07/2012   TRIG 98 06/07/2012   HDL 65 06/07/2012   LDLCALC 89 06/07/2012      Wt Readings from Last 3 Encounters:  02/01/14 170 lb (77.111 kg)  09/21/13 176 lb (79.833 kg)  08/10/13 174 lb (78.926 kg)      Other studies Reviewed:    ASSESSMENT AND PLAN:  1. mitral regurgitation 2. history of palpitations 3. Left ventricular diastolic dysfunction and past history of diastolic heart failure 4. Osteoarthritis   Current medicines are reviewed at length with the patient today.  The patient does not have concerns regarding medicines.  The following changes have been made:  no change  Labs/ tests ordered today include:   Orders Placed This Encounter  Procedures  . EKG 12-Lead     Disposition:   FU with Dr. Mare Ferrari in 6 months   Signed, Darlin Coco, MD  02/01/2014 1:27 PM    [facility] [address], [place], [place]  [address] Phone: [phone]; Fax: [phone]

## 2014-02-08 ENCOUNTER — Encounter: Payer: Self-pay | Admitting: Internal Medicine

## 2014-02-08 ENCOUNTER — Non-Acute Institutional Stay: Payer: Medicare Other | Admitting: Internal Medicine

## 2014-02-08 VITALS — BP 126/58 | HR 66 | Temp 97.4°F | Wt 172.0 lb

## 2014-02-08 DIAGNOSIS — M48061 Spinal stenosis, lumbar region without neurogenic claudication: Secondary | ICD-10-CM

## 2014-02-08 DIAGNOSIS — N3946 Mixed incontinence: Secondary | ICD-10-CM

## 2014-02-08 DIAGNOSIS — N183 Chronic kidney disease, stage 3 unspecified: Secondary | ICD-10-CM

## 2014-02-08 DIAGNOSIS — I8390 Asymptomatic varicose veins of unspecified lower extremity: Secondary | ICD-10-CM

## 2014-02-08 DIAGNOSIS — K2100 Gastro-esophageal reflux disease with esophagitis, without bleeding: Secondary | ICD-10-CM

## 2014-02-08 DIAGNOSIS — N952 Postmenopausal atrophic vaginitis: Secondary | ICD-10-CM

## 2014-02-08 DIAGNOSIS — S90122A Contusion of left lesser toe(s) without damage to nail, initial encounter: Secondary | ICD-10-CM

## 2014-02-08 DIAGNOSIS — M2010 Hallux valgus (acquired), unspecified foot: Secondary | ICD-10-CM

## 2014-02-08 DIAGNOSIS — H8110 Benign paroxysmal vertigo, unspecified ear: Secondary | ICD-10-CM

## 2014-02-08 DIAGNOSIS — I10 Essential (primary) hypertension: Secondary | ICD-10-CM

## 2014-02-08 DIAGNOSIS — E039 Hypothyroidism, unspecified: Secondary | ICD-10-CM

## 2014-02-08 DIAGNOSIS — M25561 Pain in right knee: Secondary | ICD-10-CM

## 2014-02-08 DIAGNOSIS — M4806 Spinal stenosis, lumbar region: Secondary | ICD-10-CM

## 2014-02-08 DIAGNOSIS — I5032 Chronic diastolic (congestive) heart failure: Secondary | ICD-10-CM

## 2014-02-08 DIAGNOSIS — I8393 Asymptomatic varicose veins of bilateral lower extremities: Secondary | ICD-10-CM

## 2014-02-08 DIAGNOSIS — M21619 Bunion of unspecified foot: Secondary | ICD-10-CM | POA: Insufficient documentation

## 2014-02-08 HISTORY — DX: Asymptomatic varicose veins of unspecified lower extremity: I83.90

## 2014-02-08 NOTE — Progress Notes (Signed)
Patient ID: Ann [name], female   DOB: Sep 28, 1918, 81 y.o.   MRN: [id number]     [facility]     [facility] Service[facility] ([facility]   Allergies  Allergen Reactions  . Protonix [Pantoprazole Sodium] Rash  . Ace Inhibitors Cough  . Codeine   . Darvon   . Mercurochrome [Merbromin (Mercurochrome)]   . Pantoprazole     Dizzy  . Sulfa Drugs Cross Reactors   . Tramadol     Abnormal kidney function  . Vesicare [Solifenacin]     Drys out nose and throat     Chief Complaint  Patient presents with  . Medical Management of Chronic Issues    blood pressure, CHF, CKD, thyroid  . Foot Pain    right foot dropped cake plate 3 weeks ago    HPI:  Pain in the right upper arm and shoulder. Started about a month ago. Using Traumeel homeopathic cream. She thinks it was dislocated a long time ago.  Toe injury 3 weeks ago  Had injection in the right knee by Dr. Louanne Skye. She does not think this helped anything.  Also, had injection in the lower back by Dr. Ernestina Patches.  Bunion of great toe, unspecified laterality: bilateral and mildly painful  Essential hypertension: controlled  Chronic diastolic congestive heart failure: compensated  Hypothyroidism, unspecified hypothyroidism type: compensated  Spinal stenosis, lumbar: improved with recent epidural steroid injection  Pain in right knee: moderately painful and not improved with sterooid injection by Dr. Louanne Skye  Chronic kidney disease, stage III (moderate); unchanged  Mixed incontinence: unchanged  Benign paroxysmal positional vertigo, unspecified laterality: none recently  Reflux esophagitis: asymptomatic  Atrophic vaginitis: improved with vaginal estrogen cream  Varicose vein of leg: worse on the right leg  Contusion of toe of left foot, initial encounter    Medications: Patient's Medications  New Prescriptions   No medications on file  Previous Medications   ACETAMINOPHEN (TYLENOL) 500 MG TABLET    Take 500 mg  by mouth as needed for pain (takes 2 per day or prn).   ASPIRIN 81 MG TABLET    Take 81 mg by mouth daily.     CALCIUM & MAGNESIUM CARBONATES (MYLANTA PO)    Take by mouth as needed.     CETIRIZINE (ZYRTEC) 10 MG TABLET    Take 10 mg by mouth daily. Takes 1/2 tablet each day.   CHOLECALCIFEROL (VITAMIN D PO)    Take by mouth. Taking 1000 daily    FLUVIRIN INJ INJECTION       FUROSEMIDE (LASIX) 20 MG TABLET    TAKE 1 TABLET ONCE DAILY TO PREVENT EDEMA.   LEVOTHYROXINE (SYNTHROID, LEVOTHROID) 88 MCG TABLET    TAKE (1) TABLET DAILY FOR THYROID.   LOSARTAN (COZAAR) 25 MG TABLET    One daily to control BP   MECLIZINE (ANTIVERT) 25 MG TABLET    Take 25 mg by mouth. Take one as needed for dizziness   METOPROLOL TARTRATE (LOPRESSOR) 25 MG TABLET    TAKE (1/2) TABLET DAILY.   MULTIVITAMIN (THERAGRAN) PER TABLET    Take 1 tablet by mouth daily.     POTASSIUM CHLORIDE SA (K-DUR,KLOR-CON) 20 MEQ TABLET    TAKE 1 TABLET EACH DAY.   PREMARIN VAGINAL CREAM    Use twice a week   TRIAMCINOLONE (KENALOG) 0.1 % LOTION    Apply 1 application topically as needed.    Modified Medications   No medications on file  Discontinued Medications  No medications on file     Review of Systems  Constitutional: Negative.   HENT: Positive for hearing loss.   Eyes: Negative.   Respiratory: Negative.   Cardiovascular: Negative.   Gastrointestinal: Negative.   Genitourinary: Negative for vaginal pain.       Stress incontinence.  Musculoskeletal: Positive for gait problem.       Chronic knee pains. Uses a walker.  Skin:       Improved perirectal and labial burning  Allergic/Immunologic: Negative.   Neurological: Negative.   Hematological: Negative.   Psychiatric/Behavioral:       Depressive symptoms come and go.    Filed Vitals:   02/08/14 1437  BP: 126/58  Pulse: 66  Temp: 97.4 F (36.3 C)  TempSrc: Oral  Weight: 172 lb (78.019 kg)  SpO2: 99%   Body mass index is 30.48 kg/(m^2).  Physical Exam    Constitutional: She is oriented to person, place, and time. She appears well-developed and well-nourished. No distress.  HENT:  Head: Normocephalic and atraumatic.  Right Ear: External ear normal.  Left Ear: External ear normal.  Nose: Nose normal.  Mouth/Throat: Oropharynx is clear and moist.  Loss of hearing bilaterally.  Eyes: Conjunctivae and EOM are normal. Pupils are equal, round, and reactive to light.  Corrective lenses and prisms on her glasses.  Neck: No JVD present. No tracheal deviation present. No thyromegaly present.  Cardiovascular:  Regular rhythm. Grade 3/6 systolic ejection murmur at left sternal border. No heaves or thrills on palpation. Dorsalis pedis and posterior tibial arteries have diminished pulses bilaterally. Varicose leg veins bilaterally.  Abdominal: She exhibits no distension and no mass. There is no tenderness.  Genitourinary:   labial atrophy  Musculoskeletal: She exhibits no edema.  Crepitance in both knees.  Lymphadenopathy:    She has no cervical adenopathy.  Neurological: She is alert and oriented to person, place, and time. No cranial nerve deficit. Coordination normal.  Reduced vibratory sensation in the right leg and foot.  Skin: No rash noted. No erythema. No pallor.  Psychiatric: She has a normal mood and affect. Her behavior is normal. Judgment and thought content normal.     Labs reviewed: No visits with results within 3 Month(s) from this visit. Latest known visit with results is:  [facility]urs[facility] Home on 09/21/2013  Component Date Value Ref Range Status  . Glucose 09/14/2013 83   Final  . BUN 09/14/2013 44* 4 - 21 mg/dL Final  . Creatinine 09/14/2013 1.2* 0.5 - 1.1 mg/dL Final  . Potassium 09/14/2013 4.3  3.4 - 5.3 mmol/L Final  . Sodium 09/14/2013 137  137 - 147 mmol/L Final  . TSH 09/14/2013 3.82  0.41 - 5.90 uIU/mL Final     Assessment/Plan 1. Bunion of great toe, unspecified laterality She does not want surgery  2. Essential  hypertension controlled  3. Chronic diastolic congestive heart failure compensated  4. Hypothyroidism, unspecified hypothyroidism type compensated  5. Spinal stenosis, lumbar Improved after epidural steroid injection by Dr. Ernestina Patches  6. Pain in right knee Chronic degenerative arthritis  7. Chronic kidney disease, stage III (moderate) unchanged  8. Mixed incontinence unchanged  9. Benign paroxysmal positional vertigo, unspecified laterality None recently  10. Reflux esophagitis asymptomatic  11. Atrophic vaginitis Improved on estrogen vaginal cream  12. Varicose vein of leg Tender right knee area  13. Contusion of toe of left foot, initial encounter Improving with time.

## 2014-02-09 ENCOUNTER — Other Ambulatory Visit: Payer: Self-pay | Admitting: Internal Medicine

## 2014-02-26 ENCOUNTER — Other Ambulatory Visit: Payer: Self-pay | Admitting: Internal Medicine

## 2014-05-03 ENCOUNTER — Non-Acute Institutional Stay: Payer: Medicare Other | Admitting: Internal Medicine

## 2014-05-03 VITALS — BP 104/64 | HR 68 | Temp 97.4°F | Wt 174.0 lb

## 2014-05-03 DIAGNOSIS — M25561 Pain in right knee: Secondary | ICD-10-CM

## 2014-05-03 DIAGNOSIS — Z658 Other specified problems related to psychosocial circumstances: Secondary | ICD-10-CM

## 2014-05-03 DIAGNOSIS — R42 Dizziness and giddiness: Secondary | ICD-10-CM | POA: Insufficient documentation

## 2014-05-03 DIAGNOSIS — F439 Reaction to severe stress, unspecified: Secondary | ICD-10-CM | POA: Insufficient documentation

## 2014-05-03 NOTE — Progress Notes (Signed)
Patient ID: Ann [name], female   DOB: 1918/07/14, 81 y.o.   MRN: [id number]    [facility]     [facility] (12[facility]     Allergies  Allergen Reactions  . Protonix [Pantoprazole Sodium] Rash  . Ace Inhibitors Cough  . Codeine   . Darvon   . Mercurochrome [Merbromin (Mercurochrome)]   . Pantoprazole     Dizzy  . Sulfa Drugs Cross Reactors   . Tramadol     Abnormal kidney function  . Vesicare [Solifenacin]     Drys out nose and throat     Chief Complaint  Patient presents with  . Knee Pain    right knee popped, sharpe pain Tues night 05/01/14  Here with daughter Lovey Newcomer  . Dizziness    yesterday morning before breakfast   . Stress    from death of friend    HPI:  Pain in right knee: Chronic severe degenerative joint disease secondary to osteoarthritis of the right knee. Has been seen by Dr. Louanne Skye in the past. Surgery is not considered a reasonable option for a lady in her age record. She was offered injections with Hyalgan in the past, but she chose not to do this. There was some confusion about purchasing in obtaining it. She is not totally averse to using it in the future. Her last steroid injection was done in January 2016 and it did not help. It was given in the lateral aspect of the right knee. Current pain is mainly in the medial aspect of the right knee. It seemed to intensify following standing while at a bridge tournament. She has had some increased difficulty using her cane and walker because of the pain on weightbearing. The knee never swelled up after the pain increased. She has used Tylenol and a homeopathic drug cream.  Dizzy: Had an episode of nearly passing out while standing in line for a meal 1 week ago. Has had another episode of feeling dizzy and lightheaded when she missed breakfast. No palpitations, chest pain, or headache with this.  Stress: A close friend died recently and patient is grieving. She describes this as being  stressed.    Medications: Patient's Medications  New Prescriptions   No medications on file  Previous Medications   ACETAMINOPHEN (TYLENOL) 500 MG TABLET    Take 500 mg by mouth as needed for pain (takes 2 per day or prn).   ALUM & MAG HYDROXIDE-SIMETH (MAALOX PLUS) [id number] MG/5ML SUSPENSION    Take by mouth. Take as needed   ASPIRIN 81 MG TABLET    Take 81 mg by mouth daily.     CALCIUM & MAGNESIUM CARBONATES (MYLANTA PO)    Take by mouth as needed.     CETIRIZINE (ZYRTEC) 10 MG TABLET    Take 10 mg by mouth daily. Takes 1/2 tablet each day.   CHOLECALCIFEROL (VITAMIN D PO)    Take by mouth. Taking 1000 daily    FUROSEMIDE (LASIX) 20 MG TABLET    TAKE 1 TABLET ONCE DAILY TO PREVENT EDEMA.   HOMEOPATHIC PRODUCTS (TRAUMEEL) OINT    Apply topically. Apply to shoulder and arms for pain   LEVOTHYROXINE (SYNTHROID, LEVOTHROID) 88 MCG TABLET    TAKE (1) TABLET DAILY FOR THYROID.   LOSARTAN (COZAAR) 25 MG TABLET    TAKE 1 TABLET ONCE DAILY.   MECLIZINE (ANTIVERT) 25 MG TABLET    Take 25 mg by mouth. Take one as needed for dizziness  METOPROLOL TARTRATE (LOPRESSOR) 25 MG TABLET    TAKE (1/2) TABLET DAILY.   MULTIVITAMIN (THERAGRAN) PER TABLET    Take 1 tablet by mouth daily.     POTASSIUM CHLORIDE SA (K-DUR,KLOR-CON) 20 MEQ TABLET    TAKE 1 TABLET EACH DAY.   PREMARIN VAGINAL CREAM    Use twice a week   TRIAMCINOLONE (KENALOG) 0.1 % LOTION    Apply 1 application topically as needed.    Modified Medications   No medications on file  Discontinued Medications   FLUVIRIN INJ INJECTION         Review of Systems  Constitutional: Negative.   HENT: Positive for hearing loss.   Eyes: Negative.   Respiratory: Negative.   Cardiovascular: Negative.   Gastrointestinal: Negative.   Genitourinary: Negative for vaginal pain.       Stress incontinence.  Musculoskeletal: Positive for gait problem.       Acute increase in the medial side of the right knee. Chronic knee pains. Uses a walker.   Skin:       Improved perirectal and labial burning  Allergic/Immunologic: Negative.   Neurological: Negative.   Hematological: Negative.   Psychiatric/Behavioral:       Depressive symptoms come and go.    Filed Vitals:   05/03/14 1349  BP: 104/64  Pulse: 68  Temp: 97.4 F (36.3 C)  TempSrc: Oral  Weight: 174 lb (78.926 kg)  SpO2: 98%   Body mass index is 30.83 kg/(m^2).  Physical Exam  Constitutional: She is oriented to person, place, and time. She appears well-developed and well-nourished. No distress.  HENT:  Head: Normocephalic and atraumatic.  Right Ear: External ear normal.  Left Ear: External ear normal.  Nose: Nose normal.  Mouth/Throat: Oropharynx is clear and moist.  Loss of hearing bilaterally.  Eyes: Conjunctivae and EOM are normal. Pupils are equal, round, and reactive to light.  Corrective lenses and prisms on her glasses.  Neck: No JVD present. No tracheal deviation present. No thyromegaly present.  Cardiovascular:  Regular rhythm. Grade 3/6 systolic ejection murmur at left sternal border. No heaves or thrills on palpation. Dorsalis pedis and posterior tibial arteries have diminished pulses bilaterally. Varicose leg veins bilaterally.  Abdominal: She exhibits no distension and no mass. There is no tenderness.  Genitourinary:   labial atrophy  Musculoskeletal: She exhibits no edema.  Exquisitely tender in the medial side of the right knee. Crepitance in both knees. She is able to use her walker and a cane and walk unassisted.  Lymphadenopathy:    She has no cervical adenopathy.  Neurological: She is alert and oriented to person, place, and time. No cranial nerve deficit. Coordination normal.  Reduced vibratory sensation in the right leg and foot.  Skin: No rash noted. No erythema. No pallor.  Psychiatric: She has a normal mood and affect. Her behavior is normal. Judgment and thought content normal.     Labs reviewed: Nursing Home on 02/08/2014   Component Date Value Ref Range Status  . Glucose 01/30/2014 82   Final  . BUN 01/30/2014 41* 4 - 21 mg/dL Final  . Creatinine 01/30/2014 1.1  0.5 - 1.1 mg/dL Final  . Potassium 01/30/2014 4.6  3.4 - 5.3 mmol/L Final  . Sodium 01/30/2014 141  137 - 147 mmol/L Final     Assessment/Plan Pain in right knee: Discussed referral back to Dr. Louanne Skye, but she does not want to do this at the present time. We reassured her that her circulation was okay  in her leg that was not part of her current pain. I suspect that she has popped off a little piece of cartilage and has some discomfort related to that. Her knee is not currently locking. She is able to use her walker. She will try to use Aspercreme 4 times daily and Tylenol or tramadol for pain relief.  Dizzy: May be a stress related item. Just observe for now  Stress: Grief reaction with heightened sensitivity. Observe.

## 2014-05-24 ENCOUNTER — Observation Stay (HOSPITAL_COMMUNITY)
Admission: EM | Admit: 2014-05-24 | Discharge: 2014-05-27 | Disposition: A | Discharge status: Home / Self Care | Payer: Medicare Other | Attending: Internal Medicine | Admitting: Internal Medicine

## 2014-05-24 ENCOUNTER — Encounter: Payer: Medicare Other | Admitting: Nurse Practitioner

## 2014-05-24 ENCOUNTER — Other Ambulatory Visit (HOSPITAL_COMMUNITY): Payer: Medicare Other

## 2014-05-24 ENCOUNTER — Encounter (HOSPITAL_COMMUNITY): Payer: Self-pay

## 2014-05-24 ENCOUNTER — Emergency Department (HOSPITAL_COMMUNITY): Payer: Medicare Other

## 2014-05-24 DIAGNOSIS — Z7901 Long term (current) use of anticoagulants: Secondary | ICD-10-CM | POA: Diagnosis not present

## 2014-05-24 DIAGNOSIS — N183 Chronic kidney disease, stage 3 (moderate): Secondary | ICD-10-CM | POA: Diagnosis not present

## 2014-05-24 DIAGNOSIS — Z833 Family history of diabetes mellitus: Secondary | ICD-10-CM | POA: Diagnosis not present

## 2014-05-24 DIAGNOSIS — I509 Heart failure, unspecified: Secondary | ICD-10-CM

## 2014-05-24 DIAGNOSIS — E039 Hypothyroidism, unspecified: Secondary | ICD-10-CM | POA: Diagnosis not present

## 2014-05-24 DIAGNOSIS — R059 Cough, unspecified: Secondary | ICD-10-CM | POA: Diagnosis present

## 2014-05-24 DIAGNOSIS — N184 Chronic kidney disease, stage 4 (severe): Secondary | ICD-10-CM | POA: Diagnosis present

## 2014-05-24 DIAGNOSIS — I5033 Acute on chronic diastolic (congestive) heart failure: Secondary | ICD-10-CM | POA: Diagnosis not present

## 2014-05-24 DIAGNOSIS — Z8249 Family history of ischemic heart disease and other diseases of the circulatory system: Secondary | ICD-10-CM | POA: Diagnosis not present

## 2014-05-24 DIAGNOSIS — Z886 Allergy status to analgesic agent status: Secondary | ICD-10-CM | POA: Diagnosis not present

## 2014-05-24 DIAGNOSIS — Z882 Allergy status to sulfonamides status: Secondary | ICD-10-CM | POA: Diagnosis not present

## 2014-05-24 DIAGNOSIS — R0789 Other chest pain: Secondary | ICD-10-CM

## 2014-05-24 DIAGNOSIS — E559 Vitamin D deficiency, unspecified: Secondary | ICD-10-CM | POA: Insufficient documentation

## 2014-05-24 DIAGNOSIS — Z7982 Long term (current) use of aspirin: Secondary | ICD-10-CM | POA: Insufficient documentation

## 2014-05-24 DIAGNOSIS — Z880 Allergy status to penicillin: Secondary | ICD-10-CM | POA: Diagnosis not present

## 2014-05-24 DIAGNOSIS — I340 Nonrheumatic mitral (valve) insufficiency: Secondary | ICD-10-CM | POA: Diagnosis not present

## 2014-05-24 DIAGNOSIS — Z885 Allergy status to narcotic agent status: Secondary | ICD-10-CM | POA: Insufficient documentation

## 2014-05-24 DIAGNOSIS — R0602 Shortness of breath: Secondary | ICD-10-CM | POA: Diagnosis present

## 2014-05-24 DIAGNOSIS — B372 Candidiasis of skin and nail: Secondary | ICD-10-CM | POA: Insufficient documentation

## 2014-05-24 DIAGNOSIS — I5043 Acute on chronic combined systolic (congestive) and diastolic (congestive) heart failure: Principal | ICD-10-CM | POA: Diagnosis present

## 2014-05-24 DIAGNOSIS — I5042 Chronic combined systolic (congestive) and diastolic (congestive) heart failure: Secondary | ICD-10-CM | POA: Diagnosis present

## 2014-05-24 DIAGNOSIS — K210 Gastro-esophageal reflux disease with esophagitis: Secondary | ICD-10-CM | POA: Diagnosis not present

## 2014-05-24 DIAGNOSIS — I10 Essential (primary) hypertension: Secondary | ICD-10-CM | POA: Diagnosis not present

## 2014-05-24 DIAGNOSIS — Z823 Family history of stroke: Secondary | ICD-10-CM | POA: Insufficient documentation

## 2014-05-24 DIAGNOSIS — B3749 Other urogenital candidiasis: Secondary | ICD-10-CM | POA: Diagnosis present

## 2014-05-24 DIAGNOSIS — R05 Cough: Secondary | ICD-10-CM

## 2014-05-24 DIAGNOSIS — I129 Hypertensive chronic kidney disease with stage 1 through stage 4 chronic kidney disease, or unspecified chronic kidney disease: Secondary | ICD-10-CM | POA: Insufficient documentation

## 2014-05-24 HISTORY — DX: Chronic diastolic (congestive) heart failure: I50.32

## 2014-05-24 HISTORY — DX: Chronic kidney disease, stage 3 unspecified: N18.30

## 2014-05-24 HISTORY — DX: Chronic kidney disease, stage 3 (moderate): N18.3

## 2014-05-24 HISTORY — DX: Chronic combined systolic (congestive) and diastolic (congestive) heart failure: I50.42

## 2014-05-24 HISTORY — DX: Hypothyroidism, unspecified: E03.9

## 2014-05-24 HISTORY — DX: Nonrheumatic mitral (valve) insufficiency: I34.0

## 2014-05-24 LAB — COMPREHENSIVE METABOLIC PANEL
ALBUMIN: 3.9 g/dL (ref 3.5–5.0)
ALT: 18 U/L (ref 14–54)
ANION GAP: 10 (ref 5–15)
AST: 30 U/L (ref 15–41)
Alkaline Phosphatase: 103 U/L (ref 38–126)
BUN: 50 mg/dL — ABNORMAL HIGH (ref 6–20)
CHLORIDE: 107 mmol/L (ref 101–111)
CO2: 20 mmol/L — ABNORMAL LOW (ref 22–32)
CREATININE: 1.3 mg/dL — AB (ref 0.44–1.00)
Calcium: 9.3 mg/dL (ref 8.9–10.3)
GFR calc non Af Amer: 34 mL/min — ABNORMAL LOW (ref >60)
GFR, EST AFRICAN AMERICAN: 40 mL/min — AB (ref >60)
Glucose, Bld: 96 mg/dL (ref 65–99)
POTASSIUM: 4.5 mmol/L (ref 3.5–5.1)
Sodium: 137 mmol/L (ref 135–145)
Total Bilirubin: 0.8 mg/dL (ref 0.3–1.2)
Total Protein: 7.1 g/dL (ref 6.5–8.1)

## 2014-05-24 LAB — BRAIN NATRIURETIC PEPTIDE: B NATRIURETIC PEPTIDE 5: 587.9 pg/mL — AB (ref 0.0–100.0)

## 2014-05-24 LAB — CBC WITH DIFFERENTIAL/PLATELET
BASOS ABS: 0 10*3/uL (ref 0.0–0.1)
Basophils Relative: 0 % (ref 0–1)
Eosinophils Absolute: 0.2 10*3/uL (ref 0.0–0.7)
Eosinophils Relative: 2 % (ref 0–5)
HCT: 37 % (ref 36.0–46.0)
HEMOGLOBIN: 12.5 g/dL (ref 12.0–15.0)
LYMPHS ABS: 1.1 10*3/uL (ref 0.7–4.0)
LYMPHS PCT: 13 % (ref 12–46)
MCH: 34.2 pg — ABNORMAL HIGH (ref 26.0–34.0)
MCHC: 33.8 g/dL (ref 30.0–36.0)
MCV: 101.4 fL — ABNORMAL HIGH (ref 78.0–100.0)
MONOS PCT: 10 % (ref 3–12)
Monocytes Absolute: 0.9 10*3/uL (ref 0.1–1.0)
NEUTROS PCT: 75 % (ref 43–77)
Neutro Abs: 6.3 10*3/uL (ref 1.7–7.7)
Platelets: 267 10*3/uL (ref 150–400)
RBC: 3.65 MIL/uL — AB (ref 3.87–5.11)
RDW: 13.8 % (ref 11.5–15.5)
WBC: 8.5 10*3/uL (ref 4.0–10.5)

## 2014-05-24 LAB — MRSA PCR SCREENING: MRSA by PCR: NEGATIVE

## 2014-05-24 LAB — TROPONIN I

## 2014-05-24 MED ORDER — SODIUM CHLORIDE 0.9 % IV SOLN: 250.0000 mL | INTRAVENOUS | Status: DC | PRN

## 2014-05-24 MED ORDER — METOPROLOL TARTRATE 25 MG PO TABS
12.5000 mg | ORAL_TABLET | Freq: Two times a day (BID) | ORAL | Status: DC
  Administered 2014-05-24 – 2014-05-27 (×4): 12.5 mg via ORAL
  Filled 2014-05-24 (×5): qty 1

## 2014-05-24 MED ORDER — GUAIFENESIN ER 600 MG PO TB12
600.0000 mg | ORAL_TABLET | ORAL | Status: DC | PRN
  Administered 2014-05-24: 600 mg via ORAL
  Filled 2014-05-24: qty 1

## 2014-05-24 MED ORDER — SODIUM CHLORIDE 0.9 % IJ SOLN
3.0000 mL | Freq: Two times a day (BID) | INTRAMUSCULAR | Status: DC
  Administered 2014-05-24 – 2014-05-26 (×6): 3 mL via INTRAVENOUS

## 2014-05-24 MED ORDER — ACETAMINOPHEN 325 MG PO TABS
650.0000 mg | ORAL_TABLET | ORAL | Status: DC | PRN
  Administered 2014-05-25 – 2014-05-26 (×3): 650 mg via ORAL
  Filled 2014-05-24 (×4): qty 2

## 2014-05-24 MED ORDER — FUROSEMIDE 10 MG/ML IJ SOLN
40.0000 mg | Freq: Every day | INTRAMUSCULAR | Status: DC
  Administered 2014-05-25 – 2014-05-26 (×2): 40 mg via INTRAVENOUS
  Filled 2014-05-24 (×2): qty 4

## 2014-05-24 MED ORDER — FUROSEMIDE 10 MG/ML IJ SOLN
40.0000 mg | Freq: Once | INTRAMUSCULAR | Status: AC
  Administered 2014-05-24: 40 mg via INTRAVENOUS
  Filled 2014-05-24: qty 4

## 2014-05-24 MED ORDER — HEPARIN SODIUM (PORCINE) 5000 UNIT/ML IJ SOLN
5000.0000 [IU] | Freq: Three times a day (TID) | INTRAMUSCULAR | Status: DC
  Administered 2014-05-24 – 2014-05-27 (×9): 5000 [IU] via SUBCUTANEOUS
  Filled 2014-05-24 (×11): qty 1

## 2014-05-24 MED ORDER — ACETAMINOPHEN 500 MG PO TABS
500.0000 mg | ORAL_TABLET | ORAL | Status: DC | PRN
  Administered 2014-05-24 – 2014-05-27 (×5): 500 mg via ORAL
  Filled 2014-05-24 (×5): qty 1

## 2014-05-24 MED ORDER — LOSARTAN POTASSIUM 25 MG PO TABS
25.0000 mg | ORAL_TABLET | Freq: Every day | ORAL | Status: DC
  Administered 2014-05-24 – 2014-05-27 (×4): 25 mg via ORAL
  Filled 2014-05-24 (×4): qty 1

## 2014-05-24 MED ORDER — SODIUM CHLORIDE 0.9 % IJ SOLN: 3.0000 mL | INTRAMUSCULAR | Status: DC | PRN

## 2014-05-24 MED ORDER — LEVOTHYROXINE SODIUM 88 MCG PO TABS
88.0000 ug | ORAL_TABLET | Freq: Every day | ORAL | Status: DC
  Administered 2014-05-24 – 2014-05-27 (×4): 88 ug via ORAL
  Filled 2014-05-24 (×4): qty 1

## 2014-05-24 MED ORDER — MECLIZINE HCL 25 MG PO TABS
25.0000 mg | ORAL_TABLET | ORAL | Status: DC | PRN
  Filled 2014-05-24: qty 1

## 2014-05-24 MED ORDER — BENZONATATE 100 MG PO CAPS
100.0000 mg | ORAL_CAPSULE | Freq: Once | ORAL | Status: AC
  Administered 2014-05-24: 100 mg via ORAL
  Filled 2014-05-24: qty 1

## 2014-05-24 MED ORDER — ASPIRIN 81 MG PO CHEW
81.0000 mg | CHEWABLE_TABLET | Freq: Every day | ORAL | Status: DC
  Administered 2014-05-24 – 2014-05-27 (×4): 81 mg via ORAL
  Filled 2014-05-24 (×4): qty 1

## 2014-05-24 MED ORDER — POTASSIUM CHLORIDE CRYS ER 20 MEQ PO TBCR
20.0000 meq | EXTENDED_RELEASE_TABLET | Freq: Every day | ORAL | Status: DC
  Administered 2014-05-24 – 2014-05-27 (×4): 20 meq via ORAL
  Filled 2014-05-24 (×4): qty 1

## 2014-05-24 MED ORDER — ONDANSETRON HCL 4 MG/2ML IJ SOLN: 4.0000 mg | Freq: Four times a day (QID) | INTRAMUSCULAR | Status: DC | PRN

## 2014-05-24 MED ORDER — CALCIUM CARBONATE-VITAMIN D 500-200 MG-UNIT PO TABS
1.0000 | ORAL_TABLET | Freq: Every day | ORAL | Status: DC
  Administered 2014-05-24 – 2014-05-27 (×4): 1 via ORAL
  Filled 2014-05-24 (×4): qty 1

## 2014-05-24 MED ORDER — FUROSEMIDE 10 MG/ML IJ SOLN: 40.0000 mg | Freq: Every day | INTRAMUSCULAR | Status: DC

## 2014-05-24 MED ORDER — ADULT MULTIVITAMIN W/MINERALS CH
1.0000 | ORAL_TABLET | Freq: Every day | ORAL | Status: DC
  Administered 2014-05-24 – 2014-05-27 (×4): 1 via ORAL
  Filled 2014-05-24 (×4): qty 1

## 2014-05-24 NOTE — Progress Notes (Signed)
Progress Note   Ann [name] IDP:[id number] DOB: [birth year] DOA: 05/24/2014 PCP: Estill Dooms, MD   Brief Narrative:   Ann [name] is an 81 y.o. female with a PMH of CHF, EF 50-55 percent by echo 05/04/2002, hypertension and hypothyroidism who was admitted for observation 05/24/14 with CHF exacerbation. She also complained of a 5 day history of cough productive of yellow sputum. Upon initial evaluation, BNP was 587.9 and chest x-ray showed peribronchial cuffing consistent with pulmonary edema.  Assessment/Plan:   Principal Problem:   Acute on chronic diastolic CHF (congestive heart failure) - Symptomatically improved after receiving Lasix in the ED. - I/O balance -400 mL. - Repeat 2-D echo requested. - Continue aspirin, losartan, and metoprolol. - Troponin not elevated.  Active Problems:   Hypothyroidism - Continue Synthroid.    Chronic kidney disease, stage III (moderate) - Baseline creatinine 1.1-1.3. Current creatinine 1.3.    Hypertension - Controlled on losartan and metoprolol.    DVT Prophylaxis - Continue subcutaneous heparin.  Code Status: Full. Family Communication: Rogue Bussing, daughter, updated by telephone.  Traveling here from [place] [place]. Disposition Plan: Lives at home, alone at [facility], [place].   IV Access:    Peripheral IV   Procedures and diagnostic studies:   Dg Chest 2 View  05/24/2014   CLINICAL DATA:  Cough for 1 week. Shortness of breath since this morning.  EXAM: CHEST  2 VIEW  COMPARISON:  07/05/2010  FINDINGS: Mild cardiomegaly. Atherosclerosis of thoracic aorta. There is perihilar peribronchial cuffing. No confluent airspace disease. Minimal blunting of left costophrenic angle, may reflect small effusion. No acute osseous abnormalities are seen. Degenerative change of both shoulders.  IMPRESSION: Peribronchial cuffing, may reflect bronchitis versus pulmonary edema. Suspect small left pleural effusion.  Findings may reflect CHF.   Electronically Signed   By: Jeb Levering M.D.   On: 05/24/2014 03:45     Medical Consultants:    None.  Anti-Infectives:    None.  Subjective:   Ann [name] is feeling weak and nauseated.  When asked how she is breathing, states that she "doesn't know".  Reports a headache.  Denies chest pain, but had some right sided flank pain when sitting up in the chair earlier today.   Objective:    Filed Vitals:   05/24/14 0248 05/24/14 0611  BP: 131/69 117/67  Pulse: 84 80  Temp: 97.9 F (36.6 C) 97.9 F (36.6 C)  TempSrc: Oral Oral  Resp: 22 25  Height:  5\' 4"  (1.626 m)  Weight:  77.7 kg (171 lb 4.8 oz)  SpO2: 97% 100%    Intake/Output Summary (Last 24 hours) at 05/24/14 1301 Last data filed at 05/24/14 4431  Gross per 24 hour  Intake    360 ml  Output   1400 ml  Net  -1040 ml    Exam: Gen:  NAD, irritable Cardiovascular:  RRR, III/VI murmur Respiratory:  Lungs with crackles right base Gastrointestinal:  Abdomen soft, NT/ND, + BS Extremities:  2+ edema   Data Reviewed:    Labs: Basic Metabolic Panel:  Recent Labs Lab 05/24/14 0326  NA 137  K 4.5  CL 107  CO2 20*  GLUCOSE 96  BUN 50*  CREATININE 1.30*  CALCIUM 9.3   GFR Estimated Creatinine Clearance: 27.3 mL/min (by C-G formula based on Cr of 1.3). Liver Function Tests:  Recent Labs Lab 05/24/14 0326  AST 30  ALT 18  ALKPHOS 103  BILITOT 0.8  PROT 7.1  ALBUMIN 3.9    CBC:  Recent Labs Lab 05/24/14 0326  WBC 8.5  NEUTROABS 6.3  HGB 12.5  HCT 37.0  MCV 101.4*  PLT 267   Cardiac Enzymes:  Recent Labs Lab 05/24/14 0326  TROPONINI <0.03   Microbiology No results found for this or any previous visit (from the past 240 hour(s)).   Medications:   . aspirin  81 mg Oral Daily  . calcium-vitamin D  1 tablet Oral Q breakfast  . [START ON 05/25/2014] furosemide  40 mg Intravenous Daily  . heparin  5,000 Units Subcutaneous 3 times per day  .  levothyroxine  88 mcg Oral QAC breakfast  . losartan  25 mg Oral Daily  . metoprolol tartrate  12.5 mg Oral BID  . multivitamin with minerals  1 tablet Oral Daily  . potassium chloride SA  20 mEq Oral Daily  . sodium chloride  3 mL Intravenous Q12H   Continuous Infusions:   Time spent: 25 minutes.     [facility] Pager [phone]. If unable to reach me by pager, please call my cell phone at [phone].  *Please refer to [email or web address], password [email or web address] to get updated schedule on who will round on this patient, as hospitalists switch teams weekly. If 7PM-7AM, please contact night-coverage at [email or web address], password [email or web address] for any overnight needs.  05/24/2014, 1:01 PM

## 2014-05-24 NOTE — Progress Notes (Signed)
Report received from K. Lutterloh, RN. No change from the initial pm assessment. Will continue to monitor and follow the POC. 

## 2014-05-24 NOTE — Evaluation (Signed)
Physical Therapy Evaluation Patient Details Name: ELIAS BORDNER MRN: [id number] DOB: [birth year] Today's Date: 05/24/2014   History of Present Illness  Pt is a 81 year old female admitted for acute on chronic CHF from independent living facility; with hx of BPPV, RLS, macular degeneration, and chronic R knee pain (states she received injection for R knee on Monday)  Clinical Impression  Pt admitted with above diagnosis. Pt currently with functional limitations due to the deficits listed below (see PT Problem List).  Pt will benefit from skilled PT to increase their independence and safety with mobility to allow discharge to the venue listed below.  Pt able to ambulate short distance in hallway limited by dizziness, vitals upon return to room: 121/61 mmHg, 69bpm, 98% room air SpO2.  Pt reports remaining active by ambulating around facility to [employer], plays bridge, etc.     Follow Up Recommendations Home health PT    Equipment Recommendations  None recommended by PT    Recommendations for Other Services       Precautions / Restrictions        Mobility  Bed Mobility Overal bed mobility: Needs Assistance Bed Mobility: Supine to Sit     Supine to sit: Supervision;HOB elevated        Transfers Overall transfer level: Needs assistance Equipment used: Rolling walker (2 wheeled) Transfers: Sit to/from Stand Sit to Stand: Min guard         General transfer comment: verbal cues for safety  Ambulation/Gait Ambulation/Gait assistance: Min guard Ambulation Distance (Feet): 80 Feet Assistive device: Rolling walker (2 wheeled) Gait Pattern/deviations: Step-through pattern;Decreased stride length;Trunk flexed;Antalgic     General Gait Details: slow but steady gait with RW, distance limited by pt c/o dizziness, verbal cues for RW distance (typically uses rollator with seat), with chronic R knee pain  Stairs            Wheelchair Mobility    Modified Rankin (Stroke  Patients Only)       Balance                                             Pertinent Vitals/Pain Pain Assessment: No/denies pain    Home Living       Type of Home: Independent living facility         Home Equipment: Gilford Rile - 2 wheels;Walker - 4 wheels;Cane - single point      Prior Function Level of Independence: Independent with assistive device(s)         Comments: pt reports mostly furniture walking around apt however uses rollator for facility distances - likes to play bridge, go to [employer], dining hall 2x/day     [employer]        Extremity/Trunk Assessment               Lower Extremity Assessment: Generalized weakness         Communication   Communication: No difficulties  Cognition Arousal/Alertness: Awake/alert Behavior During Therapy: WFL for tasks assessed/performed Overall Cognitive Status: Within Functional Limits for tasks assessed                      General Comments      Exercises        Assessment/Plan    PT Assessment Patient needs continued PT services  PT Diagnosis Generalized weakness  PT Problem List Decreased strength;Decreased activity tolerance;Decreased mobility  PT Treatment Interventions DME instruction;Gait training;Functional mobility training;Patient/family education;Therapeutic activities;Therapeutic exercise   PT Goals (Current goals can be found in the Care Plan section) Acute Rehab PT Goals PT Goal Formulation: With patient Time For Goal Achievement: 05/31/14 Potential to Achieve Goals: Good    Frequency Min 3X/week   Barriers to discharge        Co-evaluation               End of Session   Activity Tolerance: Patient tolerated treatment well Patient left: in chair;with chair alarm set;with call bell/phone within reach      Functional Assessment Tool Used: clinical judgement Functional Limitation: Mobility: Walking and moving around Mobility: Walking and  Moving Around Current Status (Q3009): At least 1 percent but less than 20 percent impaired, limited or restricted Mobility: Walking and Moving Around Goal Status [phone]): 0 percent impaired, limited or restricted    Time: 1126-1140 PT Time Calculation (min) (ACUTE ONLY): 14 min   Charges:   PT Evaluation $Initial PT Evaluation Tier I: 1 Procedure     PT G Codes:   PT G-Codes **NOT FOR INPATIENT CLASS** Functional Assessment Tool Used: clinical judgement Functional Limitation: Mobility: Walking and moving around Mobility: Walking and Moving Around Current Status (T6226): At least 1 percent but less than 20 percent impaired, limited or restricted Mobility: Walking and Moving Around Goal Status [phone]): 0 percent impaired, limited or restricted    Laurella Tull,KATHrine E 05/24/2014, 1:18 PM Carmelia Bake, PT, DPT 05/24/2014 Pager: [phone]

## 2014-05-24 NOTE — ED Notes (Signed)
Patient arrives via EMS from independent living facility due to complaint of cough x 1 week.  Reports increased coughing over the past 2 days, with yellow sputum.  She is concerned that she may have cracked a rib on the left side due to excessive coughing.

## 2014-05-24 NOTE — ED Provider Notes (Signed)
CSN: [id number]     Arrival date & time 05/24/14  0244 History   First MD Initiated Contact with Patient 05/24/14 0256     Chief Complaint  Patient presents with  . Cough     (Consider location/radiation/quality/duration/timing/severity/associated sxs/prior Treatment) HPI Patient presents with 5 days of cough productive of yellow sputum. She started developing left-sided lower chest pain one day ago. She states the pain is worse with deep inspiration and coughing. She's had increased shortness of breath especially when lying flat. She denies any lower extremity swelling or pain. No fever or chills. Patient states her daughter for suture before symptoms started and had cold with similar symptoms. Past Medical History  Diagnosis Date  . Mitral regurgitation   . Hypertension   . Palpitations   . Nocturnal dyspnea   . Advanced age   . Diastolic heart failure     Last echo in 5/11  . Pain in joint, lower leg 02/25/2012  . Other abnormal blood chemistry 02/25/2012  . Pain in joint, lower leg 02/23/2012  . Encounter for long-term (current) use of other medications 10/02/2011  . Cough 07/17/2011  . Internal hemorrhoids without mention of complication 00/34/9179  . Unspecified constipation 01/01/2011  . Rash and other nonspecific skin eruption 08/14/2010  . Tension headache 07/31/2010  . Benign paroxysmal positional vertigo 07/24/2010  . Cervicalgia 07/24/2010  . Urinary tract infection, site not specified 07/10/2010  . Abnormality of gait 05/09/2009  . Vitamin D deficiency 01/25/2009  . Lumbago 01/05/2008  . Cervicitis and endocervicitis 12/22/2007  . Actinic keratosis 12/22/2007  . Pain in limb 12/22/2007  . Muscle weakness (generalized) 08/04/2007  . Blepharochalasis 05/12/2007  . Insomnia, unspecified 05/12/2007  . Dermatophytosis of foot 07/14/2006  . Dizziness and giddiness 06/25/2006  . Allergic rhinitis due to pollen 04/21/2006  . Dyskinesia of esophagus 04/12/2006  . Other malaise and fatigue  04/01/2006  . Unspecified urinary incontinence 02/10/2006  . Cramp of limb 10/07/2005  . Restless legs syndrome (RLS) 06/30/2005  . Chronic kidney disease, stage II (mild) 01/02/2005  . Unspecified hypothyroidism 09/10/2004  . Diaphragmatic hernia without mention of obstruction or gangrene 10/03/2003  . Congestive heart failure, unspecified 01/05/2002  . Unspecified venous (peripheral) insufficiency 01/05/1998  . Reflux esophagitis 01/06/1996  . Peptic ulcer, unspecified site, unspecified as acute or chronic, without mention of hemorrhage, perforation, or obstruction 01/06/1996  . Diverticulosis of colon (without mention of hemorrhage) 01/06/1996  . Pain in right knee 08/08/2012  . Macular degeneration of right eye 1980's  . Macular degeneration 09/21/2013  . Varicose vein of leg 02/08/2014  . Contusion of toe of left foot 02/08/2014   Past Surgical History  Procedure Laterality Date  . Tonsillectomy and adenoidectomy  1940  . Appendectomy  1940's  . Oophorectomy  1940's  . Breast surgery  1950's    benign tumors removed  . Cataract extraction w/ intraocular lens  implant, bilateral  1982  . Lesion excision  04/2002    melanomas of scalp  Dr. Link Snuffer  . Skin cancer excision  2005    BCC of right temple  . Cholecystectomy, laparoscopic  10/2004    Dr. Deon Pilling  . Tooth extraction  06/2008    and bone implant  Dr. Matilde Haymaker  . Colonoscopy  2000   Family History  Problem Relation Age of Onset  . Stroke Mother   . Heart disease Father   . Diabetes Father   . Heart disease Brother     MI  .  Alcohol abuse Sister    History  Substance Use Topics  . Smoking status: Never Smoker   . Smokeless tobacco: Never Used  . Alcohol Use: No   OB History    No data available     Review of Systems  Constitutional: Negative for fever and chills.  Cardiovascular: Positive for chest pain. Negative for palpitations and leg swelling.  Gastrointestinal: Negative for nausea, vomiting and abdominal pain.   Musculoskeletal: Negative for back pain, neck pain and neck stiffness.  Skin: Negative for rash and wound.  Neurological: Negative for dizziness, weakness, light-headedness, numbness and headaches.  All other systems reviewed and are negative.     Allergies  Protonix; Ace inhibitors; Amoxicillin; Biaxin; Codeine; Darvon; Ibuprofen; Mercurochrome; Pantoprazole; Prilosec; Sulfa drugs cross reactors; Tramadol; and Vesicare  Home Medications   Prior to Admission medications   Medication Sig Start Date End Date Taking? Authorizing Provider  acetaminophen (TYLENOL) 500 MG tablet Take 500 mg by mouth as needed for pain (takes 2 per day or prn).   Yes Historical Provider, MD  aspirin 81 MG tablet Take 81 mg by mouth daily.     Yes Historical Provider, MD  Calcium & Magnesium Carbonates (MYLANTA PO) Take 30 mLs by mouth as needed.    Yes Historical Provider, MD  calcium-vitamin D (OSCAL WITH D) 500-200 MG-UNIT per tablet Take 1 tablet by mouth daily with breakfast.   Yes Historical Provider, MD  cetirizine (ZYRTEC) 10 MG tablet Take 10 mg by mouth daily. Takes 1/2 tablet each day.   Yes Historical Provider, MD  furosemide (LASIX) 20 MG tablet TAKE 1 TABLET ONCE DAILY TO PREVENT EDEMA. 02/26/14  Yes Estill Dooms, MD  guaiFENesin (MUCINEX) 600 MG 12 hr tablet Take 600 mg by mouth as needed for cough.   Yes Historical Provider, MD  levothyroxine (SYNTHROID, LEVOTHROID) 88 MCG tablet TAKE (1) TABLET DAILY FOR THYROID. 02/26/14  Yes Estill Dooms, MD  losartan (COZAAR) 25 MG tablet TAKE 1 TABLET ONCE DAILY. 02/09/14  Yes Estill Dooms, MD  meclizine (ANTIVERT) 25 MG tablet Take 25 mg by mouth as needed for dizziness.    Yes Historical Provider, MD  metoprolol tartrate (LOPRESSOR) 25 MG tablet TAKE (1/2) TABLET DAILY. 12/26/13  Yes Darlin Coco, MD  multivitamin [facility]) per tablet Take 1 tablet by mouth daily.     Yes Historical Provider, MD  potassium chloride SA (K-DUR,KLOR-CON) 20 MEQ tablet  TAKE 1 TABLET EACH DAY. 02/26/14  Yes Estill Dooms, MD  PREMARIN vaginal cream Place 1 Applicatorful vaginally once a week.  07/13/13  Yes Historical Provider, MD   BP 129/98 mmHg  Pulse 71  Temp(Src) 97.9 F (36.6 C) (Oral)  Resp 20  Ht 5\' 4"  (1.626 m)  Wt 171 lb 4.8 oz (77.7 kg)  BMI 29.39 kg/m2  SpO2 97% Physical Exam  Constitutional: She is oriented to person, place, and time. She appears well-developed and well-nourished. No distress.  HENT:  Head: Normocephalic and atraumatic.  Mouth/Throat: Oropharynx is clear and moist.  Eyes: EOM are normal. Pupils are equal, round, and reactive to light.  Neck: Normal range of motion. Neck supple.  Cardiovascular: Normal rate and regular rhythm.   Pulmonary/Chest: Effort normal and breath sounds normal. No respiratory distress. She has no wheezes. She has no rales. She exhibits tenderness (tenderness palpation over the left inferior lateral chest wall.).  Abdominal: Soft. Bowel sounds are normal. She exhibits no distension and no mass. There is no tenderness. There is  no rebound and no guarding.  Musculoskeletal: Normal range of motion. She exhibits no edema or tenderness.  No calf swelling or tenderness. Distal pulses intact.  Neurological: She is alert and oriented to person, place, and time.  Moves all extremities without deficit. Sensation is grossly intact.  Skin: Skin is warm and dry. No rash noted. No erythema.  Psychiatric: She has a normal mood and affect. Her behavior is normal.  Nursing note and vitals reviewed.   ED Course  Procedures (including critical care time) Labs Review Labs Reviewed  CBC WITH DIFFERENTIAL/PLATELET - Abnormal; Notable for the following:    RBC 3.65 (*)    MCV 101.4 (*)    MCH 34.2 (*)    All other components within normal limits  COMPREHENSIVE METABOLIC PANEL - Abnormal; Notable for the following:    CO2 20 (*)    BUN 50 (*)    Creatinine, Ser 1.30 (*)    GFR calc non Af Amer 34 (*)    GFR calc  Af Amer 40 (*)    All other components within normal limits  BRAIN NATRIURETIC PEPTIDE - Abnormal; Notable for the following:    B Natriuretic Peptide 587.9 (*)    All other components within normal limits  MRSA PCR SCREENING  TROPONIN I  BASIC METABOLIC PANEL    Imaging Review Dg Chest 2 View  05/24/2014   CLINICAL DATA:  Cough for 1 week. Shortness of breath since this morning.  EXAM: CHEST  2 VIEW  COMPARISON:  07/05/2010  FINDINGS: Mild cardiomegaly. Atherosclerosis of thoracic aorta. There is perihilar peribronchial cuffing. No confluent airspace disease. Minimal blunting of left costophrenic angle, may reflect small effusion. No acute osseous abnormalities are seen. Degenerative change of both shoulders.  IMPRESSION: Peribronchial cuffing, may reflect bronchitis versus pulmonary edema. Suspect small left pleural effusion. Findings may reflect CHF.   Electronically Signed   By: Jeb Levering M.D.   On: 05/24/2014 03:45     EKG Interpretation None      MDM   Final diagnoses:  Cough  CHF exacerbation  Left-sided chest wall pain    Patient's findings of pulmonary edema on chest x-ray and elevated BNP. Given low-dose Lasix in the emergency department. Discuss with [facility] hospitalists and will admit.    Julianne Rice, MD 05/25/14 [phone]

## 2014-05-24 NOTE — Care Management Note (Signed)
Case Management Note  Patient Details  Name: Ann [name] MRN: [id number] Date of Birth: [birth year]  Subjective/Objective:   81 y/o f admitted w/CHF.From [facility].                 Action/Plan:d/c plan home w/HHPT-[facility].dtr will transport back.                 Expected Discharge Plan:  [place] ([facility] liv-[facility]-PT-HHPT-facility has own [facility] PT-[facility])  In-House Referral:  Clinical Social Work  Discharge planning Services  CM Consult  Post Acute Care Choice:    Choice offered to:     DME Arranged:    DME Agency:     HH Arranged:  PT HH Agency:  Other - See comment ([facility] is the contracted HHPT.)  Status of Service:  In process, will continue to follow  Medicare Important Message Given:    Date Medicare IM Given:    Medicare IM give by:    Date Additional Medicare IM Given:    Additional Medicare Important Message give by:     If discussed at [place] of Stay Meetings, dates discussed:    Additional Comments:  Dessa Phi, RN 05/24/2014, 4:06 PM

## 2014-05-24 NOTE — Care Management Note (Signed)
Case Management Note  Patient Details  Name: MADDILYNN ESPERANZA MRN: [id number] Date of Birth: [birth year]  Subjective/Objective:     Will fax HHPT order to [facility].Levt vm w/office await call back for fax#.               Action/Plan:d/c plan home-[facility]ep liv-HHPT.   Expected Discharge Date:  05/25/14               Expected Discharge Plan:  [place] ([facility] liv-[facility]-PT-HHPT-facility has own [facility] PT-[facility])  In-House Referral:     Discharge planning Services  CM Consult  Post Acute Care Choice:    Choice offered to:     DME Arranged:    DME Agency:     HH Arranged:  PT HH Agency:  Other - See comment ([facility] is the contracted HHPT.)  Status of Service:  In process, will continue to follow  Medicare Important Message Given:    Date Medicare IM Given:    Medicare IM give by:    Date Additional Medicare IM Given:    Additional Medicare Important Message give by:     If discussed at [place] of Stay Meetings, dates discussed:    Additional Comments:  Dessa Phi, RN 05/24/2014, 4:21 PM

## 2014-05-24 NOTE — H&P (Signed)
[facility] History and Physical  SHARAINE DELANGE LPF:[id number] DOB: [birth year] DOA: 05/24/2014  Referring physician: EDP PCP: Estill Dooms, MD   Chief Complaint: SOB   HPI: Ann [name] is a 81 y.o. female with h/o CHF in the past, sees Dr. Mare Ferrari.  Patient presents to the ED with 5 days of cough productive of yellow sputum.  This has resulted in pleuritic left sided chest pain, worse with cough, better when not coughing.  She has SOB with orthopnea that is improved by sitting up.  She states her legs are always swollen so she cant really tell if they are more swollen today than normal.  No fevers nor chills.  Review of Systems: Systems reviewed.  As above, otherwise negative  Past Medical History  Diagnosis Date  . Mitral regurgitation   . Hypertension   . Palpitations   . Nocturnal dyspnea   . Advanced age   . Diastolic heart failure     Last echo in 5/11  . Pain in joint, lower leg 02/25/2012  . Other abnormal blood chemistry 02/25/2012  . Pain in joint, lower leg 02/23/2012  . Encounter for long-term (current) use of other medications 10/02/2011  . Cough 07/17/2011  . Internal hemorrhoids without mention of complication 53/29/9242  . Unspecified constipation 01/01/2011  . Rash and other nonspecific skin eruption 08/14/2010  . Tension headache 07/31/2010  . Benign paroxysmal positional vertigo 07/24/2010  . Cervicalgia 07/24/2010  . Urinary tract infection, site not specified 07/10/2010  . Abnormality of gait 05/09/2009  . Vitamin D deficiency 01/25/2009  . Lumbago 01/05/2008  . Cervicitis and endocervicitis 12/22/2007  . Actinic keratosis 12/22/2007  . Pain in limb 12/22/2007  . Muscle weakness (generalized) 08/04/2007  . Blepharochalasis 05/12/2007  . Insomnia, unspecified 05/12/2007  . Dermatophytosis of foot 07/14/2006  . Dizziness and giddiness 06/25/2006  . Allergic rhinitis due to pollen 04/21/2006  . Dyskinesia of esophagus 04/12/2006  . Other malaise and fatigue  04/01/2006  . Unspecified urinary incontinence 02/10/2006  . Cramp of limb 10/07/2005  . Restless legs syndrome (RLS) 06/30/2005  . Chronic kidney disease, stage II (mild) 01/02/2005  . Unspecified hypothyroidism 09/10/2004  . Diaphragmatic hernia without mention of obstruction or gangrene 10/03/2003  . Congestive heart failure, unspecified 01/05/2002  . Unspecified venous (peripheral) insufficiency 01/05/1998  . Reflux esophagitis 01/06/1996  . Peptic ulcer, unspecified site, unspecified as acute or chronic, without mention of hemorrhage, perforation, or obstruction 01/06/1996  . Diverticulosis of colon (without mention of hemorrhage) 01/06/1996  . Pain in right knee 08/08/2012  . Macular degeneration of right eye 1980's  . Macular degeneration 09/21/2013  . Varicose vein of leg 02/08/2014  . Contusion of toe of left foot 02/08/2014   Past Surgical History  Procedure Laterality Date  . Tonsillectomy and adenoidectomy  1940  . Appendectomy  1940's  . Oophorectomy  1940's  . Breast surgery  1950's    benign tumors removed  . Cataract extraction w/ intraocular lens  implant, bilateral  1982  . Lesion excision  04/2002    melanomas of scalp  Dr. Link Snuffer  . Skin cancer excision  2005    BCC of right temple  . Cholecystectomy, laparoscopic  10/2004    Dr. Deon Pilling  . Tooth extraction  06/2008    and bone implant  Dr. Matilde Haymaker  . Colonoscopy  2000   Social History:  reports that she has never smoked. She has never used smokeless tobacco. She reports that she  does not drink alcohol or use illicit drugs.  Allergies  Allergen Reactions  . Protonix [Pantoprazole Sodium] Rash  . Ace Inhibitors Cough  . Amoxicillin Diarrhea  . Biaxin [Clarithromycin]     Noted on info sheet from independent living  . Codeine   . Darvon   . Ibuprofen Other (See Comments)    Noted on info sheet from independent living  . Mercurochrome [Merbromin (Mercurochrome)]   . Pantoprazole     Dizzy  . Prilosec [Omeprazole]  Other (See Comments)    Noted on info sheet from independent living  . Sulfa Drugs Cross Reactors   . Tramadol     Abnormal kidney function  . Vesicare [Solifenacin]     Drys out nose and throat     Family History  Problem Relation Age of Onset  . Stroke Mother   . Heart disease Father   . Diabetes Father   . Heart disease Brother     MI  . Alcohol abuse Sister      Prior to Admission medications   Medication Sig Start Date End Date Taking? Authorizing Provider  acetaminophen (TYLENOL) 500 MG tablet Take 500 mg by mouth as needed for pain (takes 2 per day or prn).   Yes Historical Provider, MD  aspirin 81 MG tablet Take 81 mg by mouth daily.     Yes Historical Provider, MD  Calcium & Magnesium Carbonates (MYLANTA PO) Take 30 mLs by mouth as needed.    Yes Historical Provider, MD  calcium-vitamin D (OSCAL WITH D) 500-200 MG-UNIT per tablet Take 1 tablet by mouth daily with breakfast.   Yes Historical Provider, MD  cetirizine (ZYRTEC) 10 MG tablet Take 10 mg by mouth daily. Takes 1/2 tablet each day.   Yes Historical Provider, MD  furosemide (LASIX) 20 MG tablet TAKE 1 TABLET ONCE DAILY TO PREVENT EDEMA. 02/26/14  Yes Estill Dooms, MD  guaiFENesin (MUCINEX) 600 MG 12 hr tablet Take 600 mg by mouth as needed for cough.   Yes Historical Provider, MD  levothyroxine (SYNTHROID, LEVOTHROID) 88 MCG tablet TAKE (1) TABLET DAILY FOR THYROID. 02/26/14  Yes Estill Dooms, MD  losartan (COZAAR) 25 MG tablet TAKE 1 TABLET ONCE DAILY. 02/09/14  Yes Estill Dooms, MD  meclizine (ANTIVERT) 25 MG tablet Take 25 mg by mouth as needed for dizziness.    Yes Historical Provider, MD  metoprolol tartrate (LOPRESSOR) 25 MG tablet TAKE (1/2) TABLET DAILY. 12/26/13  Yes Darlin Coco, MD  multivitamin [facility]) per tablet Take 1 tablet by mouth daily.     Yes Historical Provider, MD  potassium chloride SA (K-DUR,KLOR-CON) 20 MEQ tablet TAKE 1 TABLET EACH DAY. 02/26/14  Yes Estill Dooms, MD  PREMARIN  vaginal cream Place 1 Applicatorful vaginally once a week.  07/13/13  Yes Historical Provider, MD   Physical Exam: Filed Vitals:   05/24/14 0248  BP: 131/69  Pulse: 84  Temp: 97.9 F (36.6 C)  Resp: 22    BP 131/69 mmHg  Pulse 84  Temp(Src) 97.9 F (36.6 C) (Oral)  Resp 22  SpO2 97%  General Appearance:    Alert, oriented, no distress, appears stated age  Head:    Normocephalic, atraumatic  Eyes:    PERRL, EOMI, sclera non-icteric        Nose:   Nares without drainage or epistaxis. Mucosa, turbinates normal  Throat:   Moist mucous membranes. Oropharynx without erythema or exudate.  Neck:   Supple. No carotid bruits.  No thyromegaly.  No lymphadenopathy.   Back:     No CVA tenderness, no spinal tenderness  Lungs:     Bilateral rhonchi  Chest wall:    TTP over left inferior lateral chest wall.  Heart:    Regular rate and rhythm without murmurs, gallops, rubs  Abdomen:     Soft, non-tender, nondistended, normal bowel sounds, no organomegaly  Genitalia:    deferred  Rectal:    deferred  Extremities:   Trace edema BLE  Pulses:   2+ and symmetric all extremities  Skin:   Skin color, texture, turgor normal, no rashes or lesions  Lymph nodes:   Cervical, supraclavicular, and axillary nodes normal  Neurologic:   CNII-XII intact. Normal strength, sensation and reflexes      throughout    Labs on Admission:  Basic Metabolic Panel:  Recent Labs Lab 05/24/14 0326  NA 137  K 4.5  CL 107  CO2 20*  GLUCOSE 96  BUN 50*  CREATININE 1.30*  CALCIUM 9.3   Liver Function Tests:  Recent Labs Lab 05/24/14 0326  AST 30  ALT 18  ALKPHOS 103  BILITOT 0.8  PROT 7.1  ALBUMIN 3.9   No results for input(s): LIPASE, AMYLASE in the last 168 hours. No results for input(s): AMMONIA in the last 168 hours. CBC:  Recent Labs Lab 05/24/14 0326  WBC 8.5  NEUTROABS 6.3  HGB 12.5  HCT 37.0  MCV 101.4*  PLT 267   Cardiac Enzymes:  Recent Labs Lab 05/24/14 0326  TROPONINI  <0.03    BNP (last 3 results) No results for input(s): PROBNP in the last 8760 hours. CBG: No results for input(s): GLUCAP in the last 168 hours.  Radiological Exams on Admission: Dg Chest 2 View  05/24/2014   CLINICAL DATA:  Cough for 1 week. Shortness of breath since this morning.  EXAM: CHEST  2 VIEW  COMPARISON:  07/05/2010  FINDINGS: Mild cardiomegaly. Atherosclerosis of thoracic aorta. There is perihilar peribronchial cuffing. No confluent airspace disease. Minimal blunting of left costophrenic angle, may reflect small effusion. No acute osseous abnormalities are seen. Degenerative change of both shoulders.  IMPRESSION: Peribronchial cuffing, may reflect bronchitis versus pulmonary edema. Suspect small left pleural effusion. Findings may reflect CHF.   Electronically Signed   By: Jeb Levering M.D.   On: 05/24/2014 03:45    EKG: Independently reviewed.  Assessment/Plan Principal Problem:   Acute on chronic diastolic CHF (congestive heart failure) Active Problems:   Hypothyroidism   Chronic kidney disease, stage III (moderate)   Hypertension   1. Acute on chronic diastolic CHF - 1. HF pathway 2. Lasix 40mg  IV in ED 3. 40mg  IV daily ordered to begin tomorrow (may or may not want to get 2nd dose of lasix today) 4. 2d echo ordered (last one on file is from 2011 5. Continue home ARB, beta blocker 2. Hypothyroidism - continue synthroid 3. CKD stage 3 - creatinine today is 1.3, this looks to be about her baseline since 2012 1. Daily BMP 4. HTN - continue home meds    Code Status: Full  Family Communication: No family in room, daughter being notified of patients admission by RN at patient request Disposition Plan: Admit to obs   Time spent: 70 min  Ercell Perlman M. [facility] Pager [phone]  If 7AM-7PM, please contact the day team taking care of the patient [email or web address] Password [email or web address] 05/24/2014, 6:22 AM

## 2014-05-24 NOTE — ED Notes (Signed)
Bed: HD89 Expected date:  Expected time:  Means of arrival:  Comments: EMS 13F flank pain

## 2014-05-25 ENCOUNTER — Observation Stay (HOSPITAL_COMMUNITY): Payer: Medicare Other

## 2014-05-25 ENCOUNTER — Other Ambulatory Visit: Payer: Self-pay | Admitting: Internal Medicine

## 2014-05-25 DIAGNOSIS — B3749 Other urogenital candidiasis: Secondary | ICD-10-CM | POA: Diagnosis not present

## 2014-05-25 DIAGNOSIS — R05 Cough: Secondary | ICD-10-CM | POA: Diagnosis not present

## 2014-05-25 DIAGNOSIS — N183 Chronic kidney disease, stage 3 (moderate): Secondary | ICD-10-CM | POA: Diagnosis not present

## 2014-05-25 DIAGNOSIS — I509 Heart failure, unspecified: Secondary | ICD-10-CM | POA: Diagnosis not present

## 2014-05-25 DIAGNOSIS — I5043 Acute on chronic combined systolic (congestive) and diastolic (congestive) heart failure: Secondary | ICD-10-CM | POA: Diagnosis not present

## 2014-05-25 DIAGNOSIS — I5033 Acute on chronic diastolic (congestive) heart failure: Secondary | ICD-10-CM | POA: Diagnosis not present

## 2014-05-25 LAB — BASIC METABOLIC PANEL
Anion gap: 11 (ref 5–15)
BUN: 42 mg/dL — ABNORMAL HIGH (ref 6–20)
CHLORIDE: 106 mmol/L (ref 101–111)
CO2: 20 mmol/L — ABNORMAL LOW (ref 22–32)
Calcium: 9.1 mg/dL (ref 8.9–10.3)
Creatinine, Ser: 1.26 mg/dL — ABNORMAL HIGH (ref 0.44–1.00)
GFR, EST AFRICAN AMERICAN: 41 mL/min — AB (ref >60)
GFR, EST NON AFRICAN AMERICAN: 36 mL/min — AB (ref >60)
Glucose, Bld: 97 mg/dL (ref 65–99)
Potassium: 4.3 mmol/L (ref 3.5–5.1)
Sodium: 137 mmol/L (ref 135–145)

## 2014-05-25 LAB — TROPONIN I: Troponin I: <0.03 ng/mL (ref <0.031)

## 2014-05-25 MED ORDER — POLYETHYLENE GLYCOL 3350 17 G PO PACK
17.0000 g | PACK | Freq: Every day | ORAL | Status: DC
  Administered 2014-05-25 – 2014-05-26 (×2): 17 g via ORAL
  Filled 2014-05-25 (×4): qty 1

## 2014-05-25 MED ORDER — BENZONATATE 100 MG PO CAPS
100.0000 mg | ORAL_CAPSULE | Freq: Three times a day (TID) | ORAL | Status: DC | PRN
  Administered 2014-05-25 – 2014-05-27 (×4): 100 mg via ORAL
  Filled 2014-05-25 (×4): qty 1

## 2014-05-25 NOTE — Progress Notes (Signed)
Progress Note   Ann [name] ZOX:[id number] DOB: 1918/09/18 DOA: 05/24/2014 PCP: Estill Dooms, MD   Brief Narrative:   Ann [name] is an 81 y.o. female with a PMH of CHF, EF 50-55 percent by echo 05/04/2002, hypertension and hypothyroidism who was admitted for observation 05/24/14 with CHF exacerbation. She also complained of a 5 day history of cough productive of yellow sputum. Upon initial evaluation, BNP was 587.9 and chest x-ray showed peribronchial cuffing consistent with pulmonary edema.  Assessment/Plan:   Principal Problem:   Acute on chronic diastolic CHF (congestive heart failure) - Symptomatically improved after receiving Lasix in the ED. - I/O balance - 1.7. - Repeat 2-D echo requested, pending. - Continue aspirin, losartan, and metoprolol. - Troponin not elevated.  Active Problems:   Hypothyroidism - Continue Synthroid.    Chronic kidney disease, stage III (moderate) - Baseline creatinine 1.1-1.3. Current creatinine 1.3.    Hypertension - Controlled on losartan and metoprolol.    DVT Prophylaxis - Continue subcutaneous heparin.  Code Status: Full. Family Communication: Rogue Bussing, daughter, updated by telephone.  Traveling here from [place] [employer] [place]. Disposition Plan: Lives at home, alone at [facility], [place].   IV Access:    Peripheral IV   Procedures and diagnostic studies:   Dg Chest 2 View  05/24/2014   CLINICAL DATA:  Cough for 1 week. Shortness of breath since this morning.  EXAM: CHEST  2 VIEW  COMPARISON:  07/05/2010  FINDINGS: Mild cardiomegaly. Atherosclerosis of thoracic aorta. There is perihilar peribronchial cuffing. No confluent airspace disease. Minimal blunting of left costophrenic angle, may reflect small effusion. No acute osseous abnormalities are seen. Degenerative change of both shoulders.  IMPRESSION: Peribronchial cuffing, may reflect bronchitis versus pulmonary edema. Suspect small left pleural  effusion. Findings may reflect CHF.   Electronically Signed   By: Jeb Levering M.D.   On: 05/24/2014 03:45     Medical Consultants:    None.  Anti-Infectives:    None.  Subjective:   Ann [name] is still complaining of feeling weak.  Nausea has improved.  Has a cough and left sided neck pain.     Objective:    Filed Vitals:   05/24/14 1308 05/24/14 1744 05/24/14 2110 05/25/14 0402  BP: 117/63  113/82 129/98  Pulse: 66  77 71  Temp: 97.5 F (36.4 C) 98 F (36.7 C) 98 F (36.7 C) 97.9 F (36.6 C)  TempSrc: Oral Oral Oral Oral  Resp: 20  22 20   Height:      Weight:      SpO2: 98%  96% 97%    Intake/Output Summary (Last 24 hours) at 05/25/14 0852 Last data filed at 05/25/14 0749  Gross per 24 hour  Intake    600 ml  Output   2300 ml  Net  -1700 ml    Exam: Gen:  NAD, irritable Cardiovascular:  RRR, III/VI murmur Respiratory:  Lungs clearer Gastrointestinal:  Abdomen soft, NT/ND, + BS Extremities:  2+ edema   Data Reviewed:    Labs: Basic Metabolic Panel:  Recent Labs Lab 05/24/14 0326 05/25/14 0430  NA 137 137  K 4.5 4.3  CL 107 106  CO2 20* 20*  GLUCOSE 96 97  BUN 50* 42*  CREATININE 1.30* 1.26*  CALCIUM 9.3 9.1   GFR Estimated Creatinine Clearance: 28.1 mL/min (by C-G formula based on Cr of 1.26). Liver Function Tests:  Recent Labs Lab 05/24/14 0326  AST 30  ALT 18  ALKPHOS 103  BILITOT 0.8  PROT 7.1  ALBUMIN 3.9    CBC:  Recent Labs Lab 05/24/14 0326  WBC 8.5  NEUTROABS 6.3  HGB 12.5  HCT 37.0  MCV 101.4*  PLT 267   Cardiac Enzymes:  Recent Labs Lab 05/24/14 0326  TROPONINI <0.03   Microbiology Recent Results (from the past 240 hour(s))  MRSA PCR Screening     Status: None   Collection Time: 05/24/14  3:06 PM  Result Value Ref Range Status   MRSA by PCR NEGATIVE NEGATIVE Final    Comment:        The GeneXpert MRSA Assay (FDA approved for NASAL specimens only), is one component of a comprehensive  MRSA colonization surveillance program. It is not intended to diagnose MRSA infection nor to guide or monitor treatment for MRSA infections.      Medications:   . aspirin  81 mg Oral Daily  . calcium-vitamin D  1 tablet Oral Q breakfast  . furosemide  40 mg Intravenous Daily  . heparin  5,000 Units Subcutaneous 3 times per day  . levothyroxine  88 mcg Oral QAC breakfast  . losartan  25 mg Oral Daily  . metoprolol tartrate  12.5 mg Oral BID  . multivitamin with minerals  1 tablet Oral Daily  . potassium chloride SA  20 mEq Oral Daily  . sodium chloride  3 mL Intravenous Q12H   Continuous Infusions:   Time spent: 25 minutes.     [facility] Pager [phone]. If unable to reach me by pager, please call my cell phone at [phone].  *Please refer to [email or web address], password [email or web address] to get updated schedule on who will round on this patient, as hospitalists switch teams weekly. If 7PM-7AM, please contact night-coverage at [email or web address], password [email or web address] for any overnight needs.  05/25/2014, 8:52 AM

## 2014-05-25 NOTE — Progress Notes (Signed)
  Echocardiogram 2D Echocardiogram has been performed.  Olive Motyka FRANCES 05/25/2014, 11:06 AM

## 2014-05-26 ENCOUNTER — Encounter (HOSPITAL_COMMUNITY): Payer: Self-pay | Admitting: Physician Assistant

## 2014-05-26 DIAGNOSIS — I5043 Acute on chronic combined systolic (congestive) and diastolic (congestive) heart failure: Secondary | ICD-10-CM | POA: Diagnosis not present

## 2014-05-26 DIAGNOSIS — I340 Nonrheumatic mitral (valve) insufficiency: Secondary | ICD-10-CM | POA: Diagnosis not present

## 2014-05-26 DIAGNOSIS — B3749 Other urogenital candidiasis: Secondary | ICD-10-CM | POA: Diagnosis not present

## 2014-05-26 DIAGNOSIS — R05 Cough: Secondary | ICD-10-CM | POA: Diagnosis not present

## 2014-05-26 DIAGNOSIS — I10 Essential (primary) hypertension: Secondary | ICD-10-CM | POA: Diagnosis not present

## 2014-05-26 DIAGNOSIS — E039 Hypothyroidism, unspecified: Secondary | ICD-10-CM | POA: Diagnosis not present

## 2014-05-26 DIAGNOSIS — I129 Hypertensive chronic kidney disease with stage 1 through stage 4 chronic kidney disease, or unspecified chronic kidney disease: Secondary | ICD-10-CM | POA: Diagnosis not present

## 2014-05-26 DIAGNOSIS — N183 Chronic kidney disease, stage 3 (moderate): Secondary | ICD-10-CM | POA: Diagnosis not present

## 2014-05-26 LAB — BASIC METABOLIC PANEL WITH GFR
Anion gap: 9 (ref 5–15)
BUN: 41 mg/dL — ABNORMAL HIGH (ref 6–20)
CO2: 25 mmol/L (ref 22–32)
Calcium: 9.3 mg/dL (ref 8.9–10.3)
Chloride: 103 mmol/L (ref 101–111)
Creatinine, Ser: 1.38 mg/dL — ABNORMAL HIGH (ref 0.44–1.00)
GFR calc Af Amer: 37 mL/min — ABNORMAL LOW
GFR calc non Af Amer: 32 mL/min — ABNORMAL LOW
Glucose, Bld: 97 mg/dL (ref 65–99)
Potassium: 4.3 mmol/L (ref 3.5–5.1)
Sodium: 137 mmol/L (ref 135–145)

## 2014-05-26 MED ORDER — NYSTATIN 100000 UNIT/GM EX POWD
Freq: Two times a day (BID) | CUTANEOUS | Status: DC
  Administered 2014-05-26 (×2): via TOPICAL
  Filled 2014-05-26: qty 15

## 2014-05-26 MED ORDER — NYSTATIN 100000 UNIT/GM EX CREA
TOPICAL_CREAM | Freq: Two times a day (BID) | CUTANEOUS | Status: DC
  Administered 2014-05-26: 22:00:00 via TOPICAL
  Administered 2014-05-26: 1 via TOPICAL
  Filled 2014-05-26: qty 15

## 2014-05-26 MED ORDER — MENTHOL 3 MG MT LOZG
1.0000 | LOZENGE | OROMUCOSAL | Status: DC | PRN
  Filled 2014-05-26: qty 9

## 2014-05-26 MED ORDER — DEXTROMETHORPHAN POLISTIREX ER 30 MG/5ML PO SUER
30.0000 mg | Freq: Two times a day (BID) | ORAL | Status: DC
  Administered 2014-05-26 – 2014-05-27 (×3): 30 mg via ORAL
  Filled 2014-05-26 (×3): qty 5

## 2014-05-26 NOTE — Progress Notes (Addendum)
Cardiology Consultation Note  Patient ID: Ann [name], MRN: [id number], DOB/AGE: 1918-11-01 81 y.o. Admit date: 05/24/2014   Date of Consult: 05/26/2014 Primary Physician: Estill Dooms, MD Primary Cardiologist: Dr. Mare Ferrari  Chief Complaint: cough, SOB Reason for Consultation: CHF, mod-severe MR on echo  HPI: Ann [name] is a 81 y/o female resident of [facility] with history of HTN, chronic diastolic CHF, moderate mitral regurgitation by echo in 2011, hypothyroidism, CKD stage III, h/o esophagitis, RLS, macular degeneration, severe degenerative joint disease, normal cors in 2000 (per remote DC summary, report unavailable) who presented to [facility] on 05/24/14 with complaints of cough productive of yellow sputum and dyspnea for 1 week. She has also reported orthopnea, as well as chest pain worse with inspiration and coughing. She has chronic LEE. Workup in the ER revealed Cr 1.3 (in line with prior baseline of 1.1-1.3), troponins neg x 2, macrocytosis without anemia, and normal LFTs. CXR showed peribronchial cuffing, may reflect bronchitis versus pulmonary edema, small left pleural effusion. She is afebrile, normotensive, and not hypoxic, tachycardic or tachypneic. She received 40mg  IV lasix and has been started on 40mg  IV daily. She has put out -3.9 L so far and weight 171->166lb. Last outpatient weight in 04/2014 was 174 so she is below baseline weight. 2D Echocardiogram was obtained yesterday which showed septal and posterior basal hypokinesis, EF 40-45%, elevated LVEDP, mild systolic aortic gradient without stenosis, moderate-severe MR (likely ischemic with restricted posterior leaflet motion), moderately dilated LA, PASP 30mmHg. Cardiology was consulted for further recommendations.  At baseline, she is not very active.  She is primarily limited by arthritis and chronic back pain.  She has chronic dyspnea with ambulation.  Past Medical History  Diagnosis Date  . Mitral regurgitation   .  Hypertension   . Palpitations   . Nocturnal dyspnea   . Advanced age   . Chronic diastolic CHF (congestive heart failure)     Last echo in 5/11  . Pain in joint, lower leg 02/25/2012  . Pain in joint, lower leg 02/23/2012  . Encounter for long-term (current) use of other medications 10/02/2011  . Cough 07/17/2011  . Internal hemorrhoids without mention of complication 90/24/0973  . Unspecified constipation 01/01/2011  . Rash and other nonspecific skin eruption 08/14/2010  . Tension headache 07/31/2010  . Benign paroxysmal positional vertigo 07/24/2010  . Cervicalgia 07/24/2010  . Urinary tract infection, site not specified 07/10/2010  . Abnormality of gait 05/09/2009  . Vitamin D deficiency 01/25/2009  . Lumbago 01/05/2008  . Cervicitis and endocervicitis 12/22/2007  . Actinic keratosis 12/22/2007  . Pain in limb 12/22/2007  . Muscle weakness (generalized) 08/04/2007  . Blepharochalasis 05/12/2007  . Insomnia, unspecified 05/12/2007  . Dermatophytosis of foot 07/14/2006  . Dizziness and giddiness 06/25/2006  . Allergic rhinitis due to pollen 04/21/2006  . Dyskinesia of esophagus 04/12/2006  . Other malaise and fatigue 04/01/2006  . Unspecified urinary incontinence 02/10/2006  . Cramp of limb 10/07/2005  . Restless legs syndrome (RLS) 06/30/2005  . Chronic kidney disease, stage II (mild) 01/02/2005  . Unspecified hypothyroidism 09/10/2004  . Diaphragmatic hernia without mention of obstruction or gangrene 10/03/2003  . Unspecified venous (peripheral) insufficiency 01/05/1998  . Reflux esophagitis 01/06/1996  . Peptic ulcer, unspecified site, unspecified as acute or chronic, without mention of hemorrhage, perforation, or obstruction 01/06/1996  . Diverticulosis of colon (without mention of hemorrhage) 01/06/1996  . Pain in right knee 08/08/2012  . Macular degeneration of right eye 1980's  . Macular degeneration  09/21/2013  . Varicose vein of leg 02/08/2014  . Contusion of toe of left foot 02/08/2014      Most Recent  Cardiac Studies: 2D Echo this admission - Study Conclusions - Left ventricle: Septal and posterior basal hypokinesis. The cavity size was mildly dilated. Wall thickness was normal. Systolic function was mildly to moderately reduced. The estimated ejection fraction was in the range of 40% to 45%. Doppler parameters are consistent with elevated ventricular end-diastolic filling pressure. - Aortic valve: Mild systolic gradient with no stenosis. Valve area (Vmax): 1.46 cm^2. - Mitral valve: Moderate to severe MR Likely ischemic with restricted posterior leaflet motion. - Left atrium: The atrium was moderately dilated. - Atrial septum: No defect or patent foramen ovale was identified. - Pulmonary arteries: PA peak pressure: 37 mm Hg (S). - Impressions: Consider TEE to further assess degree of MR. Impressions: - Consider TEE to further assess degree of MR.  DC summary in 2004 cites "Dr. Tami Ribas, with cardiac catheterization in  12/00, noting normal arteries and mitral regurgitation with full  report unavailable."   Surgical History:  Past Surgical History  Procedure Laterality Date  . Tonsillectomy and adenoidectomy  1940  . Appendectomy  1940's  . Oophorectomy  1940's  . Breast surgery  1950's    benign tumors removed  . Cataract extraction w/ intraocular lens  implant, bilateral  1982  . Lesion excision  04/2002    melanomas of scalp  Dr. Link Snuffer  . Skin cancer excision  2005    BCC of right temple  . Cholecystectomy, laparoscopic  10/2004    Dr. Deon Pilling  . Tooth extraction  06/2008    and bone implant  Dr. Matilde Haymaker  . Colonoscopy  2000     Home Meds: Prior to Admission medications   Medication Sig Start Date End Date Taking? Authorizing Provider  acetaminophen (TYLENOL) 500 MG tablet Take 500 mg by mouth as needed for pain (takes 2 per day or prn).   Yes Historical Provider, MD  aspirin 81 MG tablet Take 81 mg by mouth daily.     Yes  Historical Provider, MD  Calcium & Magnesium Carbonates (MYLANTA PO) Take 30 mLs by mouth as needed.    Yes Historical Provider, MD  calcium-vitamin D (OSCAL WITH D) 500-200 MG-UNIT per tablet Take 1 tablet by mouth daily with breakfast.   Yes Historical Provider, MD  cetirizine (ZYRTEC) 10 MG tablet Take 10 mg by mouth daily. Takes 1/2 tablet each day.   Yes Historical Provider, MD  furosemide (LASIX) 20 MG tablet TAKE 1 TABLET ONCE DAILY TO PREVENT EDEMA. 02/26/14  Yes Estill Dooms, MD  guaiFENesin (MUCINEX) 600 MG 12 hr tablet Take 600 mg by mouth as needed for cough.   Yes Historical Provider, MD  levothyroxine (SYNTHROID, LEVOTHROID) 88 MCG tablet TAKE (1) TABLET DAILY FOR THYROID. 02/26/14  Yes Estill Dooms, MD  losartan (COZAAR) 25 MG tablet TAKE 1 TABLET ONCE DAILY. 02/09/14  Yes Estill Dooms, MD  meclizine (ANTIVERT) 25 MG tablet Take 25 mg by mouth as needed for dizziness.    Yes Historical Provider, MD  metoprolol tartrate (LOPRESSOR) 25 MG tablet TAKE (1/2) TABLET DAILY. 12/26/13  Yes Darlin Coco, MD  multivitamin [facility]) per tablet Take 1 tablet by mouth daily.     Yes Historical Provider, MD  potassium chloride SA (K-DUR,KLOR-CON) 20 MEQ tablet TAKE 1 TABLET EACH DAY. 02/26/14  Yes Estill Dooms, MD  PREMARIN vaginal cream Place 1 Applicatorful  vaginally once a week.  07/13/13  Yes Historical Provider, MD    Inpatient Medications:  . aspirin  81 mg Oral Daily  . calcium-vitamin D  1 tablet Oral Q breakfast  . dextromethorphan  30 mg Oral BID  . furosemide  40 mg Intravenous Daily  . heparin  5,000 Units Subcutaneous 3 times per day  . levothyroxine  88 mcg Oral QAC breakfast  . losartan  25 mg Oral Daily  . metoprolol tartrate  12.5 mg Oral BID  . multivitamin with minerals  1 tablet Oral Daily  . nystatin   Topical BID  . nystatin cream   Topical BID  . polyethylene glycol  17 g Oral Daily  . potassium chloride SA  20 mEq Oral Daily  . sodium chloride  3 mL  Intravenous Q12H      Allergies:  Allergies  Allergen Reactions  . Protonix [Pantoprazole Sodium] Rash  . Ace Inhibitors Cough  . Amoxicillin Diarrhea  . Biaxin [Clarithromycin]     Noted on info sheet from independent living  . Codeine   . Darvon   . Ibuprofen Other (See Comments)    Noted on info sheet from independent living  . Mercurochrome [Merbromin (Mercurochrome)]   . Pantoprazole     Dizzy  . Prilosec [Omeprazole] Other (See Comments)    Noted on info sheet from independent living  . Sulfa Drugs Cross Reactors   . Tramadol     Abnormal kidney function  . Vesicare [Solifenacin]     Drys out nose and throat     History   Social History  . Marital Status: Widowed    Spouse Name: N/A  . Number of Children: N/A  . Years of Education: N/A   Occupational History  . retired Sales promotion account executive    Social History Main Topics  . Smoking status: Never Smoker   . Smokeless tobacco: Never Used  . Alcohol Use: No  . Drug Use: No  . Sexual Activity: No   Other Topics Concern  . Not on file   Social History Narrative   Lives at [facility] since 2008   Widowed 2006   Walks with walker           Family History  Problem Relation Age of Onset  . Stroke Mother   . Heart disease Father   . Diabetes Father   . Heart disease Brother     MI  . Alcohol abuse Sister      Review of Systems: General: negative for chills, fever, night sweats or weight changes.  Cardiovascular: negative for chest pain, edema, orthopnea, palpitations, paroxysmal nocturnal dyspnea, shortness of breath or dyspnea on exertion Dermatological: negative for rash Respiratory: negative for cough or wheezing Urologic: negative for hematuria Abdominal: negative for nausea, vomiting, diarrhea, bright red blood per rectum, melena, or hematemesis Neurologic: negative for visual changes, syncope, or dizziness All other systems reviewed and are otherwise negative except as noted  above.  Labs:  Recent Labs  05/24/14 0326 05/25/14 1145  TROPONINI <0.03 <0.03   Lab Results  Component Value Date   WBC 8.5 05/24/2014   HGB 12.5 05/24/2014   HCT 37.0 05/24/2014   MCV 101.4* 05/24/2014   PLT 267 05/24/2014    Recent Labs Lab 05/24/14 0326  05/26/14 0515  NA 137  < > 137  K 4.5  < > 4.3  CL 107  < > 103  CO2 20*  < > 25  BUN 50*  < >  41*  CREATININE 1.30*  < > 1.38*  CALCIUM 9.3  < > 9.3  PROT 7.1  --   --   BILITOT 0.8  --   --   ALKPHOS 103  --   --   ALT 18  --   --   AST 30  --   --   GLUCOSE 96  < > 97  < > = values in this interval not displayed. Lab Results  Component Value Date   CHOL 174 06/07/2012   HDL 65 06/07/2012   LDLCALC 89 06/07/2012   TRIG 98 06/07/2012   Radiology/Studies:  Dg Chest 2 View  05/24/2014   CLINICAL DATA:  Cough for 1 week. Shortness of breath since this morning.  EXAM: CHEST  2 VIEW  COMPARISON:  07/05/2010  FINDINGS: Mild cardiomegaly. Atherosclerosis of thoracic aorta. There is perihilar peribronchial cuffing. No confluent airspace disease. Minimal blunting of left costophrenic angle, may reflect small effusion. No acute osseous abnormalities are seen. Degenerative change of both shoulders.  IMPRESSION: Peribronchial cuffing, may reflect bronchitis versus pulmonary edema. Suspect small left pleural effusion. Findings may reflect CHF.   Electronically Signed   By: Jeb Levering M.D.   On: 05/24/2014 03:45    Wt Readings from Last 3 Encounters:  05/26/14 166 lb 14.2 oz (75.7 kg)  05/03/14 174 lb (78.926 kg)  02/08/14 172 lb (78.019 kg)   EKG: Not done on admission. Today: NSR 68bpm, cannot rule out prior anterior infarct, no acute ST-T changes, poor R wave progression compared to prior  Physical Exam: Blood pressure 116/65, pulse 69, temperature 98.2 F (36.8 C), temperature source Oral, resp. rate 22, height 5\' 4"  (1.626 m), weight 166 lb 14.2 oz (75.7 kg), SpO2 97 %. General: frail elderly female, in no  acute distress. Head: Normocephalic, atraumatic, sclera non-icteric, no xanthomas, nares are without discharge,  Poor hearing Neck: Negative for carotid bruits. JVD not elevated. Lungs: bibasilar rales, Breathing is unlabored. Heart: RRR 3/6 SEM at the apex Abdomen: Soft, non-tender, non-distended with normoactive bowel sounds. No hepatomegaly. No rebound/guarding. No obvious abdominal masses. Msk:  Strength and tone appear normal for age. Extremities: No clubbing or cyanosis. trace edema.  Distal pedal pulses are 2+ and equal bilaterally. Neuro: Alert and oriented X 3. No facial asymmetry. No focal deficit. Moves all extremities spontaneously. Psych:  Responds to questions appropriately with a normal affect.    Assessment and Plan:   1. Acute on chronic combined CHF - EF now 40-45% Mildly volume overloaded Would continue IV lasix x 1 more day and then convert to oral Continue beta blocker and ace inhibitor long term Given advanced age and fragility, would recommend a conservative approach.  No additional CV testing recommended presently.  Can follow with Dr Mare Ferrari in the outpatient setting for further discussion/ management 2. Mitral regurgitation with progression from moderate to moderate-severe She reports having a murmur for years.  Given advanced age, she would not be a candidate for valve repair.  No indication therefore for TEE at this time.  Conservative management discussed with family who is agreeable. 3.CKD stage III Follow renal function with diuresis 4. Hypertension, controlled 5. Code status- I had a long discussion with patient about code status.  She is clear that her wishes are DNI/DNR  No further CV testing is planned Continue current medical therapy with conversion to oral lasix tomorrow  Can follow-up with Dr Mare Ferrari or his NP in the office for transition of care appointment at  discharge.  Army Fossa MD, [facility] 05/26/2014 5:26 PM

## 2014-05-26 NOTE — Progress Notes (Addendum)
Progress Note   LAGINA READER IWL:[id number] DOB: [birth year] DOA: 05/24/2014 PCP: Estill Dooms, MD   Brief Narrative:   Ann [name] is an 81 y.o. female with a PMH of CHF, EF 50-55 percent by echo 05/04/2002, hypertension and hypothyroidism who was admitted for observation 05/24/14 with CHF exacerbation. She also complained of a 5 day history of cough productive of yellow sputum. Upon initial evaluation, BNP was 587.9 and chest x-ray showed peribronchial cuffing consistent with pulmonary edema.  Assessment/Plan:   Principal Problem:   Acute on chronic diastolic CHF (congestive heart failure) / Mitral valve regurgitation - Symptomatically improved after receiving Lasix in the ED. - Repeat 2-D echo done 05/25/14: EF 40-45 percent with septal and posterior basal hypokinesis. Moderate-severe MR. - Cardiology consultation for TEE to further assess degree of MR after discussion with family.  Sees Dr. Mare Ferrari. - Continue aspirin, losartan, and metoprolol. - Troponin not elevated.  Active Problems:   Candida skin infection groin folds - Nystatin cream.    Hypothyroidism - Continue Synthroid.    Chronic kidney disease, stage III (moderate) - Baseline creatinine 1.1-1.3. Current creatinine 1.38.    Hypertension - Controlled on losartan and metoprolol.    DVT Prophylaxis - Continue subcutaneous heparin.  Code Status: Full. Family Communication: Rogue Bussing, daughter, updated at bedsie. Disposition Plan: Lives at home, alone at [facility] [place]. Discharge pending decision regarding further evaluation. with TEE.   IV Access:    Peripheral IV   Procedures and diagnostic studies:   Dg Chest 2 View  05/24/2014   CLINICAL DATA:  Cough for 1 week. Shortness of breath since this morning.  EXAM: CHEST  2 VIEW  COMPARISON:  07/05/2010  FINDINGS: Mild cardiomegaly. Atherosclerosis of thoracic aorta. There is perihilar peribronchial cuffing. No  confluent airspace disease. Minimal blunting of left costophrenic angle, may reflect small effusion. No acute osseous abnormalities are seen. Degenerative change of both shoulders.  IMPRESSION: Peribronchial cuffing, may reflect bronchitis versus pulmonary edema. Suspect small left pleural effusion. Findings may reflect CHF.   Electronically Signed   By: Jeb Levering M.D.   On: 05/24/2014 03:45     Medical Consultants:    None.  Anti-Infectives:    None.  Subjective:   Ann [name] is still complaining of feeling weak and sore "all over", including ongoing left neck pain.  Bowels have moved.  Still with a cough.     Objective:    Filed Vitals:   05/25/14 0402 05/25/14 1413 05/25/14 2054 05/26/14 0514  BP: 129/98 101/74 130/53 116/65  Pulse: 71 74 78 69  Temp: 97.9 F (36.6 C) 97.7 F (36.5 C) 98.3 F (36.8 C) 98.2 F (36.8 C)  TempSrc: Oral Oral Oral Oral  Resp: 20 20 20 22   Height:      Weight:    75.7 kg (166 lb 14.2 oz)  SpO2: 97% 97% 98% 97%    Intake/Output Summary (Last 24 hours) at 05/26/14 0734 Last data filed at 05/26/14 1740  Gross per 24 hour  Intake    480 ml  Output   2300 ml  Net  -1820 ml    Exam: Gen:  NAD, irritable Cardiovascular:  RRR, III/VI murmur Respiratory:  Lungs with a few right sided crackles Gastrointestinal:  Abdomen soft, NT/ND, + BS Extremities:  1+ edema   Data Reviewed:    Labs: Basic Metabolic Panel:  Recent Labs Lab 05/24/14 0326 05/25/14 0430 05/26/14 0515  NA 137  137 137  K 4.5 4.3 4.3  CL 107 106 103  CO2 20* 20* 25  GLUCOSE 96 97 97  BUN 50* 42* 41*  CREATININE 1.30* 1.26* 1.38*  CALCIUM 9.3 9.1 9.3   GFR Estimated Creatinine Clearance: 25.4 mL/min (by C-G formula based on Cr of 1.38). Liver Function Tests:  Recent Labs Lab 05/24/14 0326  AST 30  ALT 18  ALKPHOS 103  BILITOT 0.8  PROT 7.1  ALBUMIN 3.9    CBC:  Recent Labs Lab 05/24/14 0326  WBC 8.5  NEUTROABS 6.3  HGB 12.5  HCT  37.0  MCV 101.4*  PLT 267   Cardiac Enzymes:  Recent Labs Lab 05/24/14 0326 05/25/14 1145  TROPONINI <0.03 <0.03   Microbiology Recent Results (from the past 240 hour(s))  MRSA PCR Screening     Status: None   Collection Time: 05/24/14  3:06 PM  Result Value Ref Range Status   MRSA by PCR NEGATIVE NEGATIVE Final    Comment:        The GeneXpert MRSA Assay (FDA approved for NASAL specimens only), is one component of a comprehensive MRSA colonization surveillance program. It is not intended to diagnose MRSA infection nor to guide or monitor treatment for MRSA infections.      Medications:   . aspirin  81 mg Oral Daily  . calcium-vitamin D  1 tablet Oral Q breakfast  . furosemide  40 mg Intravenous Daily  . heparin  5,000 Units Subcutaneous 3 times per day  . levothyroxine  88 mcg Oral QAC breakfast  . losartan  25 mg Oral Daily  . metoprolol tartrate  12.5 mg Oral BID  . multivitamin with minerals  1 tablet Oral Daily  . nystatin   Topical BID  . polyethylene glycol  17 g Oral Daily  . potassium chloride SA  20 mEq Oral Daily  . sodium chloride  3 mL Intravenous Q12H   Continuous Infusions:   Time spent: 35 minutes with > 50% of time discussing current diagnostic test results, clinical impression and plan of care.     [facility] Pager [phone]. If unable to reach me by pager, please call my cell phone at [phone].  *Please refer to [email or web address], password [email or web address] to get updated schedule on who will round on this patient, as hospitalists switch teams weekly. If 7PM-7AM, please contact night-coverage at [email or web address], password [email or web address] for any overnight needs.  05/26/2014, 7:34 AM

## 2014-05-27 ENCOUNTER — Encounter (HOSPITAL_COMMUNITY): Payer: Self-pay | Admitting: Internal Medicine

## 2014-05-27 DIAGNOSIS — B3749 Other urogenital candidiasis: Secondary | ICD-10-CM | POA: Diagnosis not present

## 2014-05-27 DIAGNOSIS — I5043 Acute on chronic combined systolic (congestive) and diastolic (congestive) heart failure: Secondary | ICD-10-CM | POA: Diagnosis not present

## 2014-05-27 DIAGNOSIS — N183 Chronic kidney disease, stage 3 (moderate): Secondary | ICD-10-CM | POA: Diagnosis not present

## 2014-05-27 DIAGNOSIS — I5042 Chronic combined systolic (congestive) and diastolic (congestive) heart failure: Secondary | ICD-10-CM | POA: Diagnosis present

## 2014-05-27 DIAGNOSIS — I509 Heart failure, unspecified: Secondary | ICD-10-CM | POA: Diagnosis not present

## 2014-05-27 LAB — BASIC METABOLIC PANEL
Anion gap: 9 (ref 5–15)
BUN: 45 mg/dL — ABNORMAL HIGH (ref 6–20)
CO2: 25 mmol/L (ref 22–32)
Calcium: 8.9 mg/dL (ref 8.9–10.3)
Chloride: 104 mmol/L (ref 101–111)
Creatinine, Ser: 1.4 mg/dL — ABNORMAL HIGH (ref 0.44–1.00)
GFR, EST AFRICAN AMERICAN: 36 mL/min — AB (ref >60)
GFR, EST NON AFRICAN AMERICAN: 31 mL/min — AB (ref >60)
Glucose, Bld: 94 mg/dL (ref 65–99)
POTASSIUM: 4.5 mmol/L (ref 3.5–5.1)
SODIUM: 138 mmol/L (ref 135–145)

## 2014-05-27 MED ORDER — POTASSIUM CHLORIDE CRYS ER 20 MEQ PO TBCR: 20.0000 meq | EXTENDED_RELEASE_TABLET | Freq: Every day | ORAL | Status: DC

## 2014-05-27 MED ORDER — BENZONATATE 100 MG PO CAPS: 100.0000 mg | ORAL_CAPSULE | Freq: Three times a day (TID) | ORAL | Status: DC | PRN

## 2014-05-27 MED ORDER — NYSTATIN 100000 UNIT/GM EX CREA: TOPICAL_CREAM | Freq: Two times a day (BID) | CUTANEOUS | Status: DC

## 2014-05-27 MED ORDER — FUROSEMIDE 40 MG PO TABS
40.0000 mg | ORAL_TABLET | Freq: Every day | ORAL | Status: DC
  Administered 2014-05-27: 40 mg via ORAL
  Filled 2014-05-27: qty 1

## 2014-05-27 MED ORDER — DIAZEPAM 2 MG PO TABS: 2.0000 mg | ORAL_TABLET | Freq: Four times a day (QID) | ORAL | Status: DC | PRN

## 2014-05-27 MED ORDER — FUROSEMIDE 40 MG PO TABS: ORAL_TABLET | ORAL | Status: DC

## 2014-05-27 MED ORDER — CYCLOBENZAPRINE HCL 5 MG PO TABS
5.0000 mg | ORAL_TABLET | Freq: Once | ORAL | Status: AC
  Administered 2014-05-27: 5 mg via ORAL
  Filled 2014-05-27: qty 1

## 2014-05-27 MED ORDER — LEVOTHYROXINE SODIUM 88 MCG PO TABS: 88.0000 ug | ORAL_TABLET | Freq: Every day | ORAL | Status: DC

## 2014-05-27 NOTE — Discharge Summary (Signed)
Physician Discharge Summary  Ann [name] WCB:[id number] DOB: 16-Jun-1918 DOA: 05/24/2014  PCP: Estill Dooms, MD  Admit date: 05/24/2014 Discharge date: 05/27/2014   Recommendations for Outpatient Follow-Up:   1. Home health physical therapy recommended at discharge.   Discharge Diagnosis:   Principal Problem:    Systolic and diastolic CHF, acute on chronic Active Problems:    Hypothyroidism    Chronic kidney disease, stage III (moderate)    Hypertension    Cough    Candida infection of genital region    Mitral valve regurgitation   Discharge disposition:  [facility]  Discharge Condition: Improved.  Diet recommendation: Low sodium, heart healthy.    History of Present Illness:   Ann [name] is an 81 y.o. female with a PMH of CHF, EF 50-55 percent by echo 05/04/2002, hypertension and hypothyroidism who was admitted for observation 05/24/14 with CHF exacerbation. She also complained of a 5 day history of cough productive of yellow sputum. Upon initial evaluation, BNP was 587.9 and chest x-ray showed peribronchial cuffing consistent with pulmonary edema.  Hospital Course by Problem:   Principal Problem:  Acute on chronic diastolic CHF (congestive heart failure) / Mitral valve regurgitation - Symptomatically improved after receiving Lasix in the ED.  - Discharged on increased dose of Lasix at 40 mg daily with instructions to weigh herself daily and give extra Lasix for weight gain. - Repeat 2-D echo done 05/25/14: EF 40-45 percent with septal and posterior basal hypokinesis. Moderate-severe MR. - Cardiology consulted for consideration of TEE to further assess degree of MR after discussion with family. Sees Dr. Mare Ferrari. - Conservative care recommended by cardiology with outpatient follow-up with Dr. Mare Ferrari recommended. - Continue aspirin, losartan, and metoprolol. - Troponin not elevated.  Active Problems:   Cough -  Discharged with a prescription for Tessalon Perles to use as needed.   Candida skin infection groin folds - Continue Nystatin cream.   Hypothyroidism - Continue Synthroid.   Chronic kidney disease, stage III (moderate) - Baseline creatinine 1.1-1.3. Discharge creatinine was 1.4.   Hypertension - Controlled on losartan and metoprolol.   Medical Consultants:    Cardiology   Discharge Exam:   Filed Vitals:   05/27/14 0559  BP: 134/67  Pulse: 70  Temp: 98 F (36.7 C)  Resp: 20   Filed Vitals:   05/26/14 0514 05/26/14 1500 05/26/14 2015 05/27/14 0559  BP: 116/65 100/64 116/54 134/67  Pulse: 69 60 66 70  Temp: 98.2 F (36.8 C) 97.8 F (36.6 C) 97.7 F (36.5 C) 98 F (36.7 C)  TempSrc: Oral Oral Oral Oral  Resp: 22 20 22 20   Height:      Weight: 75.7 kg (166 lb 14.2 oz)   76 kg (167 lb 8.8 oz)  SpO2: 97% 97% 95% 97%    Gen:  NAD Cardiovascular:  RRR, No M/R/G Respiratory: Lungs CTAB Gastrointestinal: Abdomen soft, NT/ND with normal active bowel sounds. Extremities: No C/E/C   The results of significant diagnostics from this hospitalization (including imaging, microbiology, ancillary and laboratory) are listed below for reference.     Procedures and Diagnostic Studies:   Dg Chest 2 View  05/24/2014   CLINICAL DATA:  Cough for 1 week. Shortness of breath since this morning.  EXAM: CHEST  2 VIEW  COMPARISON:  07/05/2010  FINDINGS: Mild cardiomegaly. Atherosclerosis of thoracic aorta. There is perihilar peribronchial cuffing. No confluent airspace disease. Minimal blunting of left costophrenic angle, may reflect small effusion. No  acute osseous abnormalities are seen. Degenerative change of both shoulders.  IMPRESSION: Peribronchial cuffing, may reflect bronchitis versus pulmonary edema. Suspect small left pleural effusion. Findings may reflect CHF.   Electronically Signed   By: Jeb Levering M.D.   On: 05/24/2014 03:45    2-D echo  05/25/14  Study  Conclusions  - Left ventricle: Septal and posterior basal hypokinesis. The cavity size was mildly dilated. Wall thickness was normal. Systolic function was mildly to moderately reduced. The estimated ejection fraction was in the range of 40% to 45%. Doppler parameters are consistent with elevated ventricular end-diastolic filling pressure. - Aortic valve: Mild systolic gradient with no stenosis. Valve area (Vmax): 1.46 cm^2. - Mitral valve: Moderate to severe MR Likely ischemic with restricted posterior leaflet motion. - Left atrium: The atrium was moderately dilated. - Atrial septum: No defect or patent foramen ovale was identified. - Pulmonary arteries: PA peak pressure: 37 mm Hg (S). - Impressions: Consider TEE to further assess degree of MR.    Labs:   Basic Metabolic Panel:  Recent Labs Lab 05/24/14 0326 05/25/14 0430 05/26/14 0515 05/27/14 0418  NA 137 137 137 138  K 4.5 4.3 4.3 4.5  CL 107 106 103 104  CO2 20* 20* 25 25  GLUCOSE 96 97 97 94  BUN 50* 42* 41* 45*  CREATININE 1.30* 1.26* 1.38* 1.40*  CALCIUM 9.3 9.1 9.3 8.9   GFR Estimated Creatinine Clearance: 25 mL/min (by C-G formula based on Cr of 1.4). Liver Function Tests:  Recent Labs Lab 05/24/14 0326  AST 30  ALT 18  ALKPHOS 103  BILITOT 0.8  PROT 7.1  ALBUMIN 3.9    CBC:  Recent Labs Lab 05/24/14 0326  WBC 8.5  NEUTROABS 6.3  HGB 12.5  HCT 37.0  MCV 101.4*  PLT 267   Cardiac Enzymes:  Recent Labs Lab 05/24/14 0326 05/25/14 1145  TROPONINI <0.03 <0.03   Microbiology Recent Results (from the past 240 hour(s))  MRSA PCR Screening     Status: None   Collection Time: 05/24/14  3:06 PM  Result Value Ref Range Status   MRSA by PCR NEGATIVE NEGATIVE Final    Comment:        The GeneXpert MRSA Assay (FDA approved for NASAL specimens only), is one component of a comprehensive MRSA colonization surveillance program. It is not intended to diagnose MRSA infection nor  to guide or monitor treatment for MRSA infections.      Discharge Instructions:   Discharge Instructions    (HEART FAILURE PATIENTS) Call MD:  Anytime you have any of the following symptoms: 1) 3 pound weight gain in 24 hours or 5 pounds in 1 week 2) shortness of breath, with or without a dry hacking cough 3) swelling in the hands, feet or stomach 4) if you have to sleep on extra pillows at night in order to breathe.    Complete by:  As directed      Call MD for:  difficulty breathing, headache or visual disturbances    Complete by:  As directed      Call MD for:  extreme fatigue    Complete by:  As directed      Diet - low sodium heart healthy    Complete by:  As directed      Discharge instructions    Complete by:  As directed   You were treated for heart failure in the hospital.  To prevent exacerbations of your heart failure, it is important  that you check your weight at the same time every day, and that if you gain over 3 pounds in 24 hours or 5 pounds in 1 week OR you develop worsening swelling to the legs, experience more shortness of breath or chest pain, you you take an extra dose of Lasix. And call your Primary MD..   Follow a heart healthy, low salt diet and restrict your fluid intake to 1.5 liters a day or less.     Face-to-face encounter (required for Medicare/Medicaid patients)    Complete by:  As directed   I Ayva Veilleux certify that this patient is under my care and that I, or a nurse practitioner or physician's assistant working with me, had a face-to-face encounter that meets the physician face-to-face encounter requirements with this patient on 05/27/2014. The encounter with the patient was in whole, or in part for the following medical condition(s) which is the primary reason for home health care (List medical condition): CHF, deconditioning.  The encounter with the patient was in whole, or in part, for the following medical condition, which is the primary reason for  home health care:  CHF  I certify that, based on my findings, the following services are medically necessary home health services:  Physical therapy  Reason for Medically Necessary Home Health Services:   Therapy- Home Adaptation to Facilitate Safety Therapy- Therapeutic Exercises to Increase Strength and Endurance    My clinical findings support the need for the above services:  Shortness of breath with activity  Further, I certify that my clinical findings support that this patient is homebound due to:  Shortness of Breath with activity     Home Health    Complete by:  As directed   To provide the following care/treatments:  PT     Increase activity slowly    Complete by:  As directed             Medication List    TAKE these medications        acetaminophen 500 MG tablet  Commonly known as:  TYLENOL  Take 500 mg by mouth as needed for pain (takes 2 per day or prn).     aspirin 81 MG tablet  Take 81 mg by mouth daily.     benzonatate 100 MG capsule  Commonly known as:  TESSALON  Take 1 capsule (100 mg total) by mouth 3 (three) times daily as needed for cough.     calcium-vitamin D 500-200 MG-UNIT per tablet  Commonly known as:  OSCAL WITH D  Take 1 tablet by mouth daily with breakfast.     cetirizine 10 MG tablet  Commonly known as:  ZYRTEC  Take 10 mg by mouth daily. Takes 1/2 tablet each day.     diazepam 2 MG tablet  Commonly known as:  VALIUM  Take 1 tablet (2 mg total) by mouth every 6 (six) hours as needed for anxiety or muscle spasms.     furosemide 40 MG tablet  Commonly known as:  LASIX  Take 1 tablet daily.  If you gain > 3 lbs in 24 hours or 5 lbs in 1 week, take 1 extra dose.     guaiFENesin 600 MG 12 hr tablet  Commonly known as:  MUCINEX  Take 600 mg by mouth as needed for cough.     levothyroxine 88 MCG tablet  Commonly known as:  SYNTHROID, LEVOTHROID  Take 1 tablet (88 mcg total) by mouth daily before breakfast.  losartan 25 MG tablet    Commonly known as:  COZAAR  TAKE 1 TABLET ONCE DAILY.     meclizine 25 MG tablet  Commonly known as:  ANTIVERT  Take 25 mg by mouth as needed for dizziness.     metoprolol tartrate 25 MG tablet  Commonly known as:  LOPRESSOR  TAKE (1/2) TABLET DAILY.     multivitamin per tablet  Take 1 tablet by mouth daily.     MYLANTA PO  Take 30 mLs by mouth as needed.     nystatin cream  Commonly known as:  MYCOSTATIN  Apply topically 2 (two) times daily.     potassium chloride SA 20 MEQ tablet  Commonly known as:  K-DUR,KLOR-CON  Take 1 tablet (20 mEq total) by mouth daily.     PREMARIN vaginal cream  Generic drug:  conjugated estrogens  Place 1 Applicatorful vaginally once a week.           Follow-up Information    Follow up with [facility].   Specialty:  Skilled Nursing and [place]   Why:  HHPT to be done by [facility] [employer] information:   [place] [address] [phone]       Follow up with [facility], Viviann Spare, MD. Schedule an appointment as soon as possible for a visit in 1 week.   Specialty:  Internal Medicine   Why:  Hospital follow up.   Contact information:   [place] [id number] [phone]        Time coordinating discharge: 35 minutes.  Signed:  Dashawna Delbridge  Pager [phone] [facility] 05/27/2014, 2:08 PM

## 2014-05-27 NOTE — Discharge Instructions (Signed)
Heart Failure °Heart failure is a condition in which the heart has trouble pumping blood. This means your heart does not pump blood efficiently for your body to work well. In some cases of heart failure, fluid may back up into your lungs or you may have swelling (edema) in your lower legs. Heart failure is usually a long-term (chronic) condition. It is important for you to take good care of yourself and follow your health care provider's treatment plan. °CAUSES  °Some health conditions can cause heart failure. Those health conditions include: °· High blood pressure (hypertension). Hypertension causes the heart muscle to work harder than normal. When pressure in the blood vessels is high, the heart needs to pump (contract) with more force in order to circulate blood throughout the body. High blood pressure eventually causes the heart to become stiff and weak. °· Coronary artery disease (CAD). CAD is the buildup of cholesterol and fat (plaque) in the arteries of the heart. The blockage in the arteries deprives the heart muscle of oxygen and blood. This can cause chest pain and may lead to a heart attack. High blood pressure can also contribute to CAD. °· Heart attack (myocardial infarction). A heart attack occurs when one or more arteries in the heart become blocked. The loss of oxygen damages the muscle tissue of the heart. When this happens, part of the heart muscle dies. The injured tissue does not contract as well and weakens the heart's ability to pump blood. °· Abnormal heart valves. When the heart valves do not open and close properly, it can cause heart failure. This makes the heart muscle pump harder to keep the blood flowing. °· Heart muscle disease (cardiomyopathy or myocarditis). Heart muscle disease is damage to the heart muscle from a variety of causes. These can include drug or alcohol abuse, infections, or unknown reasons. These can increase the risk of heart failure. °· Lung disease. Lung disease  makes the heart work harder because the lungs do not work properly. This can cause a strain on the heart, leading it to fail. °· Diabetes. Diabetes increases the risk of heart failure. High blood sugar contributes to high fat (lipid) levels in the blood. Diabetes can also cause slow damage to tiny blood vessels that carry important nutrients to the heart muscle. When the heart does not get enough oxygen and food, it can cause the heart to become weak and stiff. This leads to a heart that does not contract efficiently. °· Other conditions can contribute to heart failure. These include abnormal heart rhythms, thyroid problems, and low blood counts (anemia). °Certain unhealthy behaviors can increase the risk of heart failure, including: °· Being overweight. °· Smoking or chewing tobacco. °· Eating foods high in fat and cholesterol. °· Abusing illicit drugs or alcohol. °· Lacking physical activity. °SYMPTOMS  °Heart failure symptoms may vary and can be hard to detect. Symptoms may include: °· Shortness of breath with activity, such as climbing stairs. °· Persistent cough. °· Swelling of the feet, ankles, legs, or abdomen. °· Unexplained weight gain. °· Difficulty breathing when lying flat (orthopnea). °· Waking from sleep because of the need to sit up and get more air. °· Rapid heartbeat. °· Fatigue and loss of energy. °· Feeling light-headed, dizzy, or close to fainting. °· Loss of appetite. °· Nausea. °· Increased urination during the night (nocturia). °DIAGNOSIS  °A diagnosis of heart failure is based on your history, symptoms, physical examination, and diagnostic tests. Diagnostic tests for heart failure may include: °·   Echocardiography. °· Electrocardiography. °· Chest X-ray. °· Blood tests. °· Exercise stress test. °· Cardiac angiography. °· Radionuclide scans. °TREATMENT  °Treatment is aimed at managing the symptoms of heart failure. Medicines, behavioral changes, or surgical intervention may be necessary to  treat heart failure. °· Medicines to help treat heart failure may include: °¨ Angiotensin-converting enzyme (ACE) inhibitors. This type of medicine blocks the effects of a blood protein called angiotensin-converting enzyme. ACE inhibitors relax (dilate) the blood vessels and help lower blood pressure. °¨ Angiotensin receptor blockers (ARBs). This type of medicine blocks the actions of a blood protein called angiotensin. Angiotensin receptor blockers dilate the blood vessels and help lower blood pressure. °¨ Water pills (diuretics). Diuretics cause the kidneys to remove salt and water from the blood. The extra fluid is removed through urination. This loss of extra fluid lowers the volume of blood the heart pumps. °¨ Beta blockers. These prevent the heart from beating too fast and improve heart muscle strength. °¨ Digitalis. This increases the force of the heartbeat. °· Healthy behavior changes include: °¨ Obtaining and maintaining a healthy weight. °¨ Stopping smoking or chewing tobacco. °¨ Eating heart-healthy foods. °¨ Limiting or avoiding alcohol. °¨ Stopping illicit drug use. °¨ Physical activity as directed by your health care provider. °· Surgical treatment for heart failure may include: °¨ A procedure to open blocked arteries, repair damaged heart valves, or remove damaged heart muscle tissue. °¨ A pacemaker to improve heart muscle function and control certain abnormal heart rhythms. °¨ An internal cardioverter defibrillator to treat certain serious abnormal heart rhythms. °¨ A left ventricular assist device (LVAD) to assist the pumping ability of the heart. °HOME CARE INSTRUCTIONS  °· Take medicines only as directed by your health care provider. Medicines are important in reducing the workload of your heart, slowing the progression of heart failure, and improving your symptoms. °¨ Do not stop taking your medicine unless directed by your health care provider. °¨ Do not skip any dose of medicine. °¨ Refill your  prescriptions before you run out of medicine. Your medicines are needed every day. °· Engage in moderate physical activity if directed by your health care provider. Moderate physical activity can benefit some people. The elderly and people with severe heart failure should consult with a health care provider for physical activity recommendations. °· Eat heart-healthy foods. Food choices should be free of trans fat and low in saturated fat, cholesterol, and salt (sodium). Healthy choices include fresh or frozen fruits and vegetables, fish, lean meats, legumes, fat-free or low-fat dairy products, and whole grain or high fiber foods. Talk to a dietitian to learn more about heart-healthy foods. °· Limit sodium if directed by your health care provider. Sodium restriction may reduce symptoms of heart failure in some people. Talk to a dietitian to learn more about heart-healthy seasonings. °· Use healthy cooking methods. Healthy cooking methods include roasting, grilling, broiling, baking, poaching, steaming, or stir-frying. Talk to a dietitian to learn more about healthy cooking methods. °· Limit fluids if directed by your health care provider. Fluid restriction may reduce symptoms of heart failure in some people. °· Weigh yourself every day. Daily weights are important in the early recognition of excess fluid. You should weigh yourself every morning after you urinate and before you eat breakfast. Wear the same amount of clothing each time you weigh yourself. Record your daily weight. Provide your health care provider with your weight record. °· Monitor and record your blood pressure if directed by your health care   provider.  Check your pulse if directed by your health care provider.  Lose weight if directed by your health care provider. Weight loss may reduce symptoms of heart failure in some people.  Stop smoking or chewing tobacco. Nicotine makes your heart work harder by causing your blood vessels to constrict.  Do not use nicotine gum or patches before talking to your health care provider.  Keep all follow-up visits as directed by your health care provider. This is important.  Limit alcohol intake to no more than 1 drink per day for nonpregnant women and 2 drinks per day for men. One drink equals 12 ounces of beer, 5 ounces of wine, or 1 ounces of hard liquor. Drinking more than that is harmful to your heart. Tell your health care provider if you drink alcohol several times a week. Talk with your health care provider about whether alcohol is safe for you. If your heart has already been damaged by alcohol or you have severe heart failure, drinking alcohol should be stopped completely.  Stop illicit drug use.  Stay up-to-date with immunizations. It is especially important to prevent respiratory infections through current pneumococcal and influenza immunizations.  Manage other health conditions such as hypertension, diabetes, thyroid disease, or abnormal heart rhythms as directed by your health care provider.  Learn to manage stress.  Plan rest periods when fatigued.  Learn strategies to manage high temperatures. If the weather is extremely hot:  Avoid vigorous physical activity.  Use air conditioning or fans or seek a cooler location.  Avoid caffeine and alcohol.  Wear loose-fitting, lightweight, and light-colored clothing.  Learn strategies to manage cold temperatures. If the weather is extremely cold:  Avoid vigorous physical activity.  Layer clothes.  Wear mittens or gloves, a hat, and a scarf when going outside.  Avoid alcohol.  Obtain ongoing education and support as needed.  Participate in or seek rehabilitation as needed to maintain or improve independence and quality of life. SEEK MEDICAL CARE IF:   Your weight increases by 03 lb/1.4 kg in 1 day or 05 lb/2.3 kg in a week.  You have increasing shortness of breath that is unusual for you.  You are unable to participate in  your usual physical activities.  You tire easily.  You cough more than normal, especially with physical activity.  You have any or more swelling in areas such as your hands, feet, ankles, or abdomen.  You are unable to sleep because it is hard to breathe.  You feel like your heart is beating fast (palpitations).  You become dizzy or light-headed upon standing up. SEEK IMMEDIATE MEDICAL CARE IF:   You have difficulty breathing.  There is a change in mental status such as decreased alertness or difficulty with concentration.  You have a pain or discomfort in your chest.  You have an episode of fainting (syncope). MAKE SURE YOU:   Understand these instructions.  Will watch your condition.  Will get help right away if you are not doing well or get worse. Document Released: 12/22/2004 Document Revised: 05/08/2013 Document Reviewed: 01/22/2012 [facility] Patient Information 2015 [place], [place]. This information is not intended to replace advice given to you by your health care provider. Make sure you discuss any questions you have with your health care provider.  Heart Failure Heart failure is a condition in which the heart has trouble pumping blood. This means your heart does not pump blood efficiently for your body to work well. In some cases of heart failure,  fluid may back up into your lungs or you may have swelling (edema) in your lower legs. Heart failure is usually a long-term (chronic) condition. It is important for you to take good care of yourself and follow your health care provider's treatment plan. CAUSES  Some health conditions can cause heart failure. Those health conditions include:  High blood pressure (hypertension). Hypertension causes the heart muscle to work harder than normal. When pressure in the blood vessels is high, the heart needs to pump (contract) with more force in order to circulate blood throughout the body. High blood pressure eventually causes the heart  to become stiff and weak.  Coronary artery disease (CAD). CAD is the buildup of cholesterol and fat (plaque) in the arteries of the heart. The blockage in the arteries deprives the heart muscle of oxygen and blood. This can cause chest pain and may lead to a heart attack. High blood pressure can also contribute to CAD.  Heart attack (myocardial infarction). A heart attack occurs when one or more arteries in the heart become blocked. The loss of oxygen damages the muscle tissue of the heart. When this happens, part of the heart muscle dies. The injured tissue does not contract as well and weakens the heart's ability to pump blood.  Abnormal heart valves. When the heart valves do not open and close properly, it can cause heart failure. This makes the heart muscle pump harder to keep the blood flowing.  Heart muscle disease (cardiomyopathy or myocarditis). Heart muscle disease is damage to the heart muscle from a variety of causes. These can include drug or alcohol abuse, infections, or unknown reasons. These can increase the risk of heart failure.  Lung disease. Lung disease makes the heart work harder because the lungs do not work properly. This can cause a strain on the heart, leading it to fail.  Diabetes. Diabetes increases the risk of heart failure. High blood sugar contributes to high fat (lipid) levels in the blood. Diabetes can also cause slow damage to tiny blood vessels that carry important nutrients to the heart muscle. When the heart does not get enough oxygen and food, it can cause the heart to become weak and stiff. This leads to a heart that does not contract efficiently.  Other conditions can contribute to heart failure. These include abnormal heart rhythms, thyroid problems, and low blood counts (anemia). Certain unhealthy behaviors can increase the risk of heart failure, including:  Being overweight.  Smoking or chewing tobacco.  Eating foods high in fat and  cholesterol.  Abusing illicit drugs or alcohol.  Lacking physical activity. SYMPTOMS  Heart failure symptoms may vary and can be hard to detect. Symptoms may include:  Shortness of breath with activity, such as climbing stairs.  Persistent cough.  Swelling of the feet, ankles, legs, or abdomen.  Unexplained weight gain.  Difficulty breathing when lying flat (orthopnea).  Waking from sleep because of the need to sit up and get more air.  Rapid heartbeat.  Fatigue and loss of energy.  Feeling light-headed, dizzy, or close to fainting.  Loss of appetite.  Nausea.  Increased urination during the night (nocturia). DIAGNOSIS  A diagnosis of heart failure is based on your history, symptoms, physical examination, and diagnostic tests. Diagnostic tests for heart failure may include:  Echocardiography.  Electrocardiography.  Chest X-ray.  Blood tests.  Exercise stress test.  Cardiac angiography.  Radionuclide scans. TREATMENT  Treatment is aimed at managing the symptoms of heart failure. Medicines, behavioral changes, or  surgical intervention may be necessary to treat heart failure.  Medicines to help treat heart failure may include:  Angiotensin-converting enzyme (ACE) inhibitors. This type of medicine blocks the effects of a blood protein called angiotensin-converting enzyme. ACE inhibitors relax (dilate) the blood vessels and help lower blood pressure.  Angiotensin receptor blockers (ARBs). This type of medicine blocks the actions of a blood protein called angiotensin. Angiotensin receptor blockers dilate the blood vessels and help lower blood pressure.  Water pills (diuretics). Diuretics cause the kidneys to remove salt and water from the blood. The extra fluid is removed through urination. This loss of extra fluid lowers the volume of blood the heart pumps.  Beta blockers. These prevent the heart from beating too fast and improve heart muscle  strength.  Digitalis. This increases the force of the heartbeat.  Healthy behavior changes include:  Obtaining and maintaining a healthy weight.  Stopping smoking or chewing tobacco.  Eating heart-healthy foods.  Limiting or avoiding alcohol.  Stopping illicit drug use.  Physical activity as directed by your health care provider.  Surgical treatment for heart failure may include:  A procedure to open blocked arteries, repair damaged heart valves, or remove damaged heart muscle tissue.  A pacemaker to improve heart muscle function and control certain abnormal heart rhythms.  An internal cardioverter defibrillator to treat certain serious abnormal heart rhythms.  A left ventricular assist device (LVAD) to assist the pumping ability of the heart. HOME CARE INSTRUCTIONS   Take medicines only as directed by your health care provider. Medicines are important in reducing the workload of your heart, slowing the progression of heart failure, and improving your symptoms.  Do not stop taking your medicine unless directed by your health care provider.  Do not skip any dose of medicine.  Refill your prescriptions before you run out of medicine. Your medicines are needed every day.  Engage in moderate physical activity if directed by your health care provider. Moderate physical activity can benefit some people. The elderly and people with severe heart failure should consult with a health care provider for physical activity recommendations.  Eat heart-healthy foods. Food choices should be free of trans fat and low in saturated fat, cholesterol, and salt (sodium). Healthy choices include fresh or frozen fruits and vegetables, fish, lean meats, legumes, fat-free or low-fat dairy products, and whole grain or high fiber foods. Talk to a dietitian to learn more about heart-healthy foods.  Limit sodium if directed by your health care provider. Sodium restriction may reduce symptoms of heart  failure in some people. Talk to a dietitian to learn more about heart-healthy seasonings.  Use healthy cooking methods. Healthy cooking methods include roasting, grilling, broiling, baking, poaching, steaming, or stir-frying. Talk to a dietitian to learn more about healthy cooking methods.  Limit fluids if directed by your health care provider. Fluid restriction may reduce symptoms of heart failure in some people.  Weigh yourself every day. Daily weights are important in the early recognition of excess fluid. You should weigh yourself every morning after you urinate and before you eat breakfast. Wear the same amount of clothing each time you weigh yourself. Record your daily weight. Provide your health care provider with your weight record.  Monitor and record your blood pressure if directed by your health care provider.  Check your pulse if directed by your health care provider.  Lose weight if directed by your health care provider. Weight loss may reduce symptoms of heart failure in some people.  Stop  smoking or chewing tobacco. Nicotine makes your heart work harder by causing your blood vessels to constrict. Do not use nicotine gum or patches before talking to your health care provider.  Keep all follow-up visits as directed by your health care provider. This is important.  Limit alcohol intake to no more than 1 drink per day for nonpregnant women and 2 drinks per day for men. One drink equals 12 ounces of beer, 5 ounces of wine, or 1 ounces of hard liquor. Drinking more than that is harmful to your heart. Tell your health care provider if you drink alcohol several times a week. Talk with your health care provider about whether alcohol is safe for you. If your heart has already been damaged by alcohol or you have severe heart failure, drinking alcohol should be stopped completely.  Stop illicit drug use.  Stay up-to-date with immunizations. It is especially important to prevent respiratory  infections through current pneumococcal and influenza immunizations.  Manage other health conditions such as hypertension, diabetes, thyroid disease, or abnormal heart rhythms as directed by your health care provider.  Learn to manage stress.  Plan rest periods when fatigued.  Learn strategies to manage high temperatures. If the weather is extremely hot:  Avoid vigorous physical activity.  Use air conditioning or fans or seek a cooler location.  Avoid caffeine and alcohol.  Wear loose-fitting, lightweight, and light-colored clothing.  Learn strategies to manage cold temperatures. If the weather is extremely cold:  Avoid vigorous physical activity.  Layer clothes.  Wear mittens or gloves, a hat, and a scarf when going outside.  Avoid alcohol.  Obtain ongoing education and support as needed.  Participate in or seek rehabilitation as needed to maintain or improve independence and quality of life. SEEK MEDICAL CARE IF:   Your weight increases by 03 lb/1.4 kg in 1 day or 05 lb/2.3 kg in a week.  You have increasing shortness of breath that is unusual for you.  You are unable to participate in your usual physical activities.  You tire easily.  You cough more than normal, especially with physical activity.  You have any or more swelling in areas such as your hands, feet, ankles, or abdomen.  You are unable to sleep because it is hard to breathe.  You feel like your heart is beating fast (palpitations).  You become dizzy or light-headed upon standing up. SEEK IMMEDIATE MEDICAL CARE IF:   You have difficulty breathing.  There is a change in mental status such as decreased alertness or difficulty with concentration.  You have a pain or discomfort in your chest.  You have an episode of fainting (syncope). MAKE SURE YOU:   Understand these instructions.  Will watch your condition.  Will get help right away if you are not doing well or get worse. Document  Released: 12/22/2004 Document Revised: 05/08/2013 Document Reviewed: 01/22/2012 [facility] Patient Information 2015 [place], [place]. This information is not intended to replace advice given to you by your health care provider. Make sure you discuss any questions you have with your health care provider.

## 2014-05-27 NOTE — Care Management Note (Signed)
Case Management Note  Patient Details  Name: Ann [name] MRN: [id number] Date of Birth: 12-12-1918  Subjective/Objective:                    Action/Plan:   Expected Discharge Date:  05/25/14               Expected Discharge Plan:   (Indep liv-[facility]-PT-HHPT-facility has own [facility])  In-House Referral:     Discharge planning Services  CM Consult  Post Acute Care Choice:  Home Health Choice offered to:  Patient  DME Arranged:    DME Agency:     HH Arranged:  PT HH Agency:  Other - See comment ([facility] is the contracted HHPT.)  Status of Service:  Completed, signed off  Medicare Important Message Given:  Yes Date Medicare IM Given:  05/25/14 Medicare IM give by:  Brandt Loosen Date Additional Medicare IM Given:    Additional Medicare Important Message give by:     If discussed at [place] of Stay Meetings, dates discussed:    Additional Comments: NCM spoke to pt and states she lives at [facility] in [place] apt. [facility] offer [facility] with the Careers adviser. Contacted [facility] and faxed orders, # [phone] for [facility] PT.  Erenest Rasher, RN 05/27/2014, 12:43 PM

## 2014-05-28 ENCOUNTER — Telehealth: Payer: Self-pay | Admitting: Cardiology

## 2014-05-28 NOTE — Telephone Encounter (Signed)
New problem    Pt's daughter calling and stated pt need hospital f/u this week due to pt was in the hospital. Daughter stated she lives out of town and can only bring pt this week Tuesday, Wednesday or Thursday. Please advise pt's daughter.

## 2014-05-28 NOTE — Telephone Encounter (Signed)
Patient contacted regarding discharge from [facility] on 05/27/14  Patient understands to follow up with provider  Dr. Mare Ferrari on 05/29/14 at 1:30 at [facility]. Patient understands discharge instructions? yes Patient understands medications and regiment? yes

## 2014-05-29 ENCOUNTER — Encounter: Payer: Self-pay | Admitting: Cardiology

## 2014-05-29 ENCOUNTER — Ambulatory Visit (INDEPENDENT_AMBULATORY_CARE_PROVIDER_SITE_OTHER): Payer: Medicare Other | Admitting: Cardiology

## 2014-05-29 VITALS — BP 100/50 | HR 71 | Ht 63.0 in | Wt 166.1 lb

## 2014-05-29 DIAGNOSIS — I340 Nonrheumatic mitral (valve) insufficiency: Secondary | ICD-10-CM | POA: Diagnosis not present

## 2014-05-29 DIAGNOSIS — I5032 Chronic diastolic (congestive) heart failure: Secondary | ICD-10-CM | POA: Diagnosis not present

## 2014-05-29 DIAGNOSIS — I119 Hypertensive heart disease without heart failure: Secondary | ICD-10-CM

## 2014-05-29 LAB — BASIC METABOLIC PANEL
BUN: 57 mg/dL — ABNORMAL HIGH (ref 6–23)
CO2: 25 mEq/L (ref 19–32)
CREATININE: 1.86 mg/dL — AB (ref 0.40–1.20)
Calcium: 9.1 mg/dL (ref 8.4–10.5)
Chloride: 102 mEq/L (ref 96–112)
GFR: 26.84 mL/min — ABNORMAL LOW (ref >60.00)
GLUCOSE: 87 mg/dL (ref 70–99)
Potassium: 4.6 mEq/L (ref 3.5–5.1)
Sodium: 134 mEq/L — ABNORMAL LOW (ref 135–145)

## 2014-05-29 NOTE — Progress Notes (Signed)
Cardiology Office Note   Date:  05/29/2014   ID:  Ann [name], DOB Aug 03, 1918, MRN [id number]  PCP:  Estill Dooms, MD  Cardiologist: Darlin Coco MD  No chief complaint on file.     History of Present Illness: Ann [name] is a 81 y.o. female who presents for post hospital office visit  This pleasant 81 year old woman is seen for a post hospital office visit. She has a past history of palpitations and supraventricular tachycardia. She also has a history of mitral regurgitation. She had an echocardiogram in May 2011 which showed normal left ventricular systolic function with an ejection fraction of 55-60% and an impaired relaxation. She has had a past history of congestive heart failure secondary to diastolic dysfunction. She does not have any history of ischemic heart disease. She has a history of arthritis and previously had been on tramadol which was recently stopped by her primary care physician because of worsening renal function. She was seen as a work in September 2013 complaining of palpitations and she wore a 30 day monitor from 09/09/11 until 10/08/11 and the monitor did not show any significant arrhythmias. She was in normal sinus rhythm with occasional PVCs and PACs.   The patient was admitted to the hospital 5/19 until 05/2214 with acute on chronic systolic and diastolic heart failure and moderate to severe mitral regurgitation.  She responded to Lasix.  Conservative management was undertaken.  She did not require TEE.  Because of her advanced age, she is not a candidate for surgery on her mitral valve. Since discharge from the hospital she has been doing well.  Her weight has been stable.  She knows that if she gains 3 pounds in 24 hours she is to take an extra Lasix.  We are going to check a basal metabolic panel today  Past Medical History  Diagnosis Date  . Mitral regurgitation   . Hypertension   . Palpitations   . Nocturnal dyspnea   . Advanced age   .  Chronic diastolic CHF (congestive heart failure)     Last echo in 5/11  . Pain in joint, lower leg 02/25/2012  . Pain in joint, lower leg 02/23/2012  . Encounter for long-term (current) use of other medications 10/02/2011  . Cough 07/17/2011  . Internal hemorrhoids without mention of complication 09/62/8366  . Unspecified constipation 01/01/2011  . Rash and other nonspecific skin eruption 08/14/2010  . Tension headache 07/31/2010  . Benign paroxysmal positional vertigo 07/24/2010  . Cervicalgia 07/24/2010  . Urinary tract infection, site not specified 07/10/2010  . Abnormality of gait 05/09/2009  . Vitamin D deficiency 01/25/2009  . Lumbago 01/05/2008  . Cervicitis and endocervicitis 12/22/2007  . Actinic keratosis 12/22/2007  . Pain in limb 12/22/2007  . Muscle weakness (generalized) 08/04/2007  . Blepharochalasis 05/12/2007  . Insomnia, unspecified 05/12/2007  . Dermatophytosis of foot 07/14/2006  . Dizziness and giddiness 06/25/2006  . Allergic rhinitis due to pollen 04/21/2006  . Dyskinesia of esophagus 04/12/2006  . Other malaise and fatigue 04/01/2006  . Unspecified urinary incontinence 02/10/2006  . Cramp of limb 10/07/2005  . Restless legs syndrome (RLS) 06/30/2005  . CKD (chronic kidney disease), stage III   . Hypothyroidism   . Diaphragmatic hernia without mention of obstruction or gangrene 10/03/2003  . Unspecified venous (peripheral) insufficiency 01/05/1998  . Reflux esophagitis 01/06/1996  . Peptic ulcer, unspecified site, unspecified as acute or chronic, without mention of hemorrhage, perforation, or obstruction 01/06/1996  . Diverticulosis  of colon (without mention of hemorrhage) 01/06/1996  . Pain in right knee 08/08/2012  . Macular degeneration of right eye 1980's  . Macular degeneration 09/21/2013  . Varicose vein of leg 02/08/2014  . Mitral valve regurgitation     Moderate-severe by echo 05/2014  . Systolic and diastolic CHF, chronic     Past Surgical History  Procedure Laterality Date  .  Tonsillectomy and adenoidectomy  1940  . Appendectomy  1940's  . Oophorectomy  1940's  . Breast surgery  1950's    benign tumors removed  . Cataract extraction w/ intraocular lens  implant, bilateral  1982  . Lesion excision  04/2002    melanomas of scalp  Dr. Link Snuffer  . Skin cancer excision  2005    BCC of right temple  . Cholecystectomy, laparoscopic  10/2004    Dr. Deon Pilling  . Tooth extraction  06/2008    and bone implant  Dr. Matilde Haymaker  . Colonoscopy  2000     Current Outpatient Prescriptions  Medication Sig Dispense Refill  . acetaminophen (TYLENOL) 500 MG tablet Take 500 mg by mouth as needed for pain (takes 2 per day or prn).    Marland Kitchen aspirin 81 MG tablet Take 81 mg by mouth daily.      . benzonatate (TESSALON) 100 MG capsule Take 1 capsule (100 mg total) by mouth 3 (three) times daily as needed for cough. 20 capsule 0  . Calcium & Magnesium Carbonates (MYLANTA PO) Take 30 mLs by mouth as needed (indigestion).     . calcium-vitamin D (OSCAL WITH D) 500-200 MG-UNIT per tablet Take 1 tablet by mouth daily with breakfast.    . cetirizine (ZYRTEC) 10 MG tablet Take 5 mg by mouth daily. Takes 1/2 tablet each day.    . diazepam (VALIUM) 2 MG tablet Take 1 tablet (2 mg total) by mouth every 6 (six) hours as needed for anxiety or muscle spasms. 30 tablet 0  . furosemide (LASIX) 40 MG tablet Take 1 tablet daily.  If you gain > 3 lbs in 24 hours or 5 lbs in 1 week, take 1 extra dose. 60 tablet 3  . guaiFENesin (MUCINEX) 600 MG 12 hr tablet Take 600 mg by mouth as needed for cough.    . levothyroxine (SYNTHROID, LEVOTHROID) 88 MCG tablet Take 1 tablet (88 mcg total) by mouth daily before breakfast. 30 tablet 3  . losartan (COZAAR) 25 MG tablet Take 25 mg by mouth daily.    . meclizine (ANTIVERT) 25 MG tablet Take 25 mg by mouth as needed for dizziness.     . metoprolol tartrate (LOPRESSOR) 25 MG tablet Take 12.5 mg by mouth daily.    . multivitamin (THERAGRAN) per tablet Take 1 tablet by  mouth daily.      Marland Kitchen nystatin cream (MYCOSTATIN) Apply topically 2 (two) times daily. 30 g 0  . potassium chloride SA (K-DUR,KLOR-CON) 20 MEQ tablet Take 1 tablet (20 mEq total) by mouth daily. 30 tablet 3  . PREMARIN vaginal cream Place 1 Applicatorful vaginally once a week.      No current facility-administered medications for this visit.    Allergies:   Protonix; Ace inhibitors; Amoxicillin; Biaxin; Codeine; Darvon; Ibuprofen; Mercurochrome; Pantoprazole; Prilosec; Sulfa drugs cross reactors; Tramadol; and Vesicare    Social History:  The patient  reports that she has never smoked. She has never used smokeless tobacco. She reports that she does not drink alcohol or use illicit drugs.   Family History:  The  patient's family history includes Alcohol abuse in her sister; Diabetes in her father; Heart disease in her brother and father; Stroke in her mother.    ROS:  Please see the history of present illness.   Otherwise, review of systems are positive for none.   All other systems are reviewed and negative.    PHYSICAL EXAM: VS:  BP 100/50 mmHg  Pulse 71  Ht 5\' 3"  (1.6 m)  Wt 166 lb 1.9 oz (75.352 kg)  BMI 29.43 kg/m2 , BMI Body mass index is 29.43 kg/(m^2). GEN: Well nourished, well developed, in no acute distress HEENT: normal Neck: no JVD, carotid bruits, or masses Cardiac: Regular sinus rhythm.  Grade 2/6 holosystolic apical systolic murmur Respiratory:  clear to auscultation bilaterally, normal work of breathing GI: soft, nontender, nondistended, + BS MS: no deformity or atrophy Skin: warm and dry, no rash Neuro:  Strength and sensation are intact Psych: euthymic mood, full affect   EKG:  EKG is ordered today. The ekg ordered today demonstrates normal sinus rhythm with occasional PVC   Recent Labs: 09/14/2013: TSH 3.82 05/24/2014: ALT 18; B Natriuretic Peptide 587.9*; Hemoglobin 12.5; Platelets 267 05/27/2014: BUN 45*; Creatinine 1.40*; Potassium 4.5; Sodium 138     Lipid Panel    Component Value Date/Time   CHOL 174 06/07/2012   TRIG 98 06/07/2012   HDL 65 06/07/2012   LDLCALC 89 06/07/2012      Wt Readings from Last 3 Encounters:  05/29/14 166 lb 1.9 oz (75.352 kg)  05/03/14 174 lb (78.926 kg)  02/08/14 172 lb (78.019 kg)      Other studies Reviewed: Additional studies/ records that were reviewed today include: Hospital discharge summary dated 05/27/14. Review of the above records demonstrates: Discharged on increased dose of Lasix at 40 mg daily.  Continue aspirin losartan and metoprolol.   ASSESSMENT AND PLAN:  1.  Recent exacerbation of acute on chronic systolic and diastolic heart failure. 2.  Moderate to severe mitral regurgitation 3.  Chronic kidney disease stage III with discharge creatinine was 1.4 4.  Hypothyroidism.  On Synthroid 5.  Essential hypertension    Current medicines are reviewed at length with the patient today.  The patient does not have concerns regarding medicines.  The following changes have been made:  no change  Labs/ tests ordered today include:  No orders of the defined types were placed in this encounter.     Disposition: Check a basal metabolic panel today.  Continue current medication.  She will raise the head of her bed at home to help her sleep at night.  She is considering moving to assisted living.  Her family is encouraging her to strongly consider that. Recheck here in 3 months for office visit EKG and basal metabolic panel  Signed, Darlin Coco MD 05/29/2014 2:19 PM    [facility] [address], [place], [place]  [address] Phone: [phone]; Fax: [phone]

## 2014-05-29 NOTE — Patient Instructions (Signed)
Medication Instructions:  Your physician recommends that you continue on your current medications as directed. Please refer to the Current Medication list given to you today.  Labwork: bmet  Testing/Procedures: none  Follow-Up: Your physician recommends that you schedule a follow-up appointment in: 3 month ov/bmet/ekg

## 2014-05-31 ENCOUNTER — Encounter: Payer: Medicare Other | Admitting: Internal Medicine

## 2014-05-31 ENCOUNTER — Telehealth: Payer: Self-pay | Admitting: Cardiology

## 2014-05-31 NOTE — Telephone Encounter (Signed)
New Message      Pt's daughter calling wanting to get pt's labs that Dr. Mare Ferrari ordered faxed to [facility] Attn: Geanie Logan at [phone]. Please call back if you have any questions.

## 2014-06-01 NOTE — Telephone Encounter (Signed)
Fax received and will get labs drawn

## 2014-06-01 NOTE — Telephone Encounter (Signed)
Faxed, called and Left message to call back to make sure recieved

## 2014-06-02 ENCOUNTER — Encounter: Payer: Self-pay | Admitting: Cardiology

## 2014-06-02 ENCOUNTER — Encounter: Payer: Self-pay | Admitting: Internal Medicine

## 2014-06-07 LAB — BASIC METABOLIC PANEL
BUN: 46 mg/dL — AB (ref 4–21)
Creatinine: 1.4 mg/dL — AB (ref 0.5–1.1)
Glucose: 87 mg/dL
POTASSIUM: 4.2 mmol/L (ref 3.4–5.3)
Sodium: 139 mmol/L (ref 137–147)

## 2014-06-08 ENCOUNTER — Encounter: Payer: Self-pay | Admitting: Cardiology

## 2014-06-11 ENCOUNTER — Telehealth: Payer: Self-pay | Admitting: Cardiology

## 2014-06-11 NOTE — Telephone Encounter (Signed)
-----   Message from Darlin Coco, MD sent at 06/08/2014  4:35 PM EDT ----- The kidney function is stable.  CSD

## 2014-06-11 NOTE — Telephone Encounter (Signed)
Advised patient of lab results  

## 2014-06-11 NOTE — Telephone Encounter (Signed)
Follow Up ° ° ° ° ° °Pt returning Melinda's phone call. °

## 2014-06-13 NOTE — Telephone Encounter (Signed)
Order was faxed to [facility]

## 2014-06-14 ENCOUNTER — Non-Acute Institutional Stay: Payer: Medicare Other | Admitting: Internal Medicine

## 2014-06-14 ENCOUNTER — Encounter: Payer: Self-pay | Admitting: Internal Medicine

## 2014-06-14 VITALS — BP 118/60 | HR 68 | Temp 97.4°F | Wt 169.0 lb

## 2014-06-14 DIAGNOSIS — N183 Chronic kidney disease, stage 3 unspecified: Secondary | ICD-10-CM

## 2014-06-14 DIAGNOSIS — I5042 Chronic combined systolic (congestive) and diastolic (congestive) heart failure: Secondary | ICD-10-CM | POA: Diagnosis not present

## 2014-06-14 DIAGNOSIS — I10 Essential (primary) hypertension: Secondary | ICD-10-CM | POA: Diagnosis not present

## 2014-06-14 DIAGNOSIS — E039 Hypothyroidism, unspecified: Secondary | ICD-10-CM

## 2014-06-14 DIAGNOSIS — R0600 Dyspnea, unspecified: Secondary | ICD-10-CM | POA: Diagnosis not present

## 2014-06-14 DIAGNOSIS — M25561 Pain in right knee: Secondary | ICD-10-CM | POA: Diagnosis not present

## 2014-06-14 NOTE — Progress Notes (Signed)
Patient ID: Ann [name], female   DOB: [birth year], 81 y.o.   MRN: [id number]    [facility]     Allergies  Allergen Reactions  . Protonix [Pantoprazole Sodium] Rash  . Ace Inhibitors Cough  . Amoxicillin Diarrhea  . Biaxin [Clarithromycin]     Noted on info sheet from independent living  . Codeine   . Darvon   . Ibuprofen Other (See Comments)    Noted on info sheet from independent living  . Mercurochrome [Merbromin (Mercurochrome)]   . Pantoprazole     Dizzy  . Prilosec [Omeprazole] Other (See Comments)    Noted on info sheet from independent living  . Sulfa Drugs Cross Reactors   . Tramadol     Abnormal kidney function  . Vesicare [Solifenacin]     Drys out nose and throat     Chief Complaint  Patient presents with  . Hospitalization Follow-up    in hospital 5/19 to 5/22 for CHF    HPI:  Systolic and diastolic CHF, chronic: Compensated. Being followed by Dr. Mare Ferrari. While hospitalized she had her diuretics doubled. LVEF on latest echocardiogram was 40%.  Chronic kidney disease, stage III (moderate): Elevation of BUN and creatinine initially worsened by the diuretics, but most recent lab shows some slight improvement.  Essential hypertension: Controlled  Hypothyroidism, unspecified hypothyroidism type: Needs follow-up  Right knee pain: Received sterile injection in the knee prior to her acute worsening of her congestive heart failure. She is worried that the injection of steroid created the problem with her heart. She was enrolled in a physical therapy course to try to help her knee. She says it exhausts her increase her much pain that she is going to refuse to take the physical therapy anymore.  Dyspnea: Remains mild to moderately short of breath with exertion.    Medications: Patient's Medications  New Prescriptions   No medications on file  Previous Medications   ACETAMINOPHEN (TYLENOL) 500 MG  TABLET    Take 500 mg by mouth as needed for pain (takes 2 per day or prn).   ASPIRIN 81 MG TABLET    Take 81 mg by mouth daily.     BENZONATATE (TESSALON) 100 MG CAPSULE    Take 1 capsule (100 mg total) by mouth 3 (three) times daily as needed for cough.   CALCIUM & MAGNESIUM CARBONATES (MYLANTA PO)    Take 30 mLs by mouth as needed (indigestion).    CALCIUM-VITAMIN D (OSCAL WITH D) 500-200 MG-UNIT PER TABLET    Take 1 tablet by mouth daily with breakfast.   CETIRIZINE (ZYRTEC) 10 MG TABLET    Take 5 mg by mouth daily. Takes 1/2 tablet each day.   DIAZEPAM (VALIUM) 2 MG TABLET    Take 1 tablet (2 mg total) by mouth every 6 (six) hours as needed for anxiety or muscle spasms.   FLUTICASONE (FLONASE) 50 MCG/ACT NASAL SPRAY    Place into both nostrils daily.   FUROSEMIDE (LASIX) 40 MG TABLET    Take 1 tablet daily.  If you gain > 3 lbs in 24 hours or 5 lbs in 1 week, take 1 extra dose.   GUAIFENESIN (MUCINEX) 600 MG 12 HR TABLET    Take 600 mg by mouth as needed for cough.   LEVOTHYROXINE (SYNTHROID, LEVOTHROID) 88 MCG TABLET    Take 1 tablet (88 mcg total) by mouth daily before breakfast.   LOSARTAN (COZAAR) 25  MG TABLET    Take 25 mg by mouth daily.   MECLIZINE (ANTIVERT) 25 MG TABLET    Take 25 mg by mouth as needed for dizziness.    METOPROLOL TARTRATE (LOPRESSOR) 25 MG TABLET    Take 12.5 mg by mouth daily.   MULTIVITAMIN (THERAGRAN) PER TABLET    Take 1 tablet by mouth daily.     NYSTATIN CREAM (MYCOSTATIN)    Apply topically 2 (two) times daily.   POTASSIUM CHLORIDE SA (K-DUR,KLOR-CON) 20 MEQ TABLET    Take 1 tablet (20 mEq total) by mouth daily.   PREMARIN VAGINAL CREAM    Place 1 Applicatorful vaginally once a week.   Modified Medications   No medications on file  Discontinued Medications   No medications on file     Review of Systems  Constitutional: Negative.   HENT: Positive for hearing loss.   Eyes: Negative.   Respiratory: Positive for shortness of breath.   Cardiovascular:  Negative.        Hospitalized May 0174 for acute systolic and diastolic congestive heart failure.  Gastrointestinal: Negative.   Genitourinary: Negative for vaginal pain.       Stress incontinence.  Musculoskeletal: Positive for gait problem.       Acute increase in the medial side of the right knee. Chronic knee pains. Uses a walker.  Skin:       Improved perirectal and labial burning  Allergic/Immunologic: Negative.   Neurological: Negative.   Hematological: Negative.   Psychiatric/Behavioral:       Depressive symptoms come and go.    Filed Vitals:   06/14/14 1540  BP: 118/60  Pulse: 68  Temp: 97.4 F (36.3 C)  TempSrc: Oral  Weight: 169 lb (76.658 kg)  SpO2: 94%   Body mass index is 29.94 kg/(m^2).  Physical Exam  Constitutional: She is oriented to person, place, and time. She appears well-developed and well-nourished. No distress.  HENT:  Head: Normocephalic and atraumatic.  Right Ear: External ear normal.  Left Ear: External ear normal.  Nose: Nose normal.  Mouth/Throat: Oropharynx is clear and moist.  Loss of hearing bilaterally.  Eyes: Conjunctivae and EOM are normal. Pupils are equal, round, and reactive to light.  Corrective lenses and prisms on her glasses.  Neck: No JVD present. No tracheal deviation present. No thyromegaly present.  Cardiovascular:  Regular rhythm. Grade 3/6 systolic ejection murmur at left sternal border. No heaves or thrills on palpation. Dorsalis pedis and posterior tibial arteries have diminished pulses bilaterally. Varicose leg veins bilaterally.  Abdominal: She exhibits no distension and no mass. There is no tenderness.  Genitourinary:   labial atrophy  Musculoskeletal: She exhibits no edema.  Exquisitely tender in the medial side of the right knee. Crepitance in both knees. She is able to use her walker and a cane and walk unassisted.  Lymphadenopathy:    She has no cervical adenopathy.  Neurological: She is alert and oriented to  person, place, and time. No cranial nerve deficit. Coordination normal.  Reduced vibratory sensation in the right leg and foot.  Skin: No rash noted. No erythema. No pallor.  Psychiatric: She has a normal mood and affect. Her behavior is normal. Judgment and thought content normal.     Labs reviewed: Nursing Home on 06/14/2014  Component Date Value Ref Range Status  . Glucose 06/07/2014 87   Final  . BUN 06/07/2014 46* 4 - 21 mg/dL Final  . Creatinine 06/07/2014 1.4* 0.5 - 1.1 mg/dL Final  .  Potassium 06/07/2014 4.2  3.4 - 5.3 mmol/L Final  . Sodium 06/07/2014 139  137 - 147 mmol/L Final  Office Visit on 05/29/2014  Component Date Value Ref Range Status  . Sodium 05/29/2014 134* 135 - 145 mEq/L Final  . Potassium 05/29/2014 4.6  3.5 - 5.1 mEq/L Final  . Chloride 05/29/2014 102  96 - 112 mEq/L Final  . CO2 05/29/2014 25  19 - 32 mEq/L Final  . Glucose, Bld 05/29/2014 87  70 - 99 mg/dL Final  . BUN 05/29/2014 57* 6 - 23 mg/dL Final  . Creatinine, Ser 05/29/2014 1.86* 0.40 - 1.20 mg/dL Final  . Calcium 05/29/2014 9.1  8.4 - 10.5 mg/dL Final  . GFR 05/29/2014 26.84* >60.00 mL/min Final  Admission on 05/24/2014, Discharged on 05/27/2014  Component Date Value Ref Range Status  . WBC 05/24/2014 8.5  4.0 - 10.5 K/uL Final  . RBC 05/24/2014 3.65* 3.87 - 5.11 MIL/uL Final  . Hemoglobin 05/24/2014 12.5  12.0 - 15.0 g/dL Final  . HCT 05/24/2014 37.0  36.0 - 46.0 % Final  . MCV 05/24/2014 101.4* 78.0 - 100.0 fL Final  . MCH 05/24/2014 34.2* 26.0 - 34.0 pg Final  . MCHC 05/24/2014 33.8  30.0 - 36.0 g/dL Final  . RDW 05/24/2014 13.8  11.5 - 15.5 % Final  . Platelets 05/24/2014 267  150 - 400 K/uL Final  . Neutrophils Relative % 05/24/2014 75  43 - 77 % Final  . Neutro Abs 05/24/2014 6.3  1.7 - 7.7 K/uL Final  . Lymphocytes Relative 05/24/2014 13  12 - 46 % Final  . Lymphs Abs 05/24/2014 1.1  0.7 - 4.0 K/uL Final  . Monocytes Relative 05/24/2014 10  3 - 12 % Final  . Monocytes Absolute  05/24/2014 0.9  0.1 - 1.0 K/uL Final  . Eosinophils Relative 05/24/2014 2  0 - 5 % Final  . Eosinophils Absolute 05/24/2014 0.2  0.0 - 0.7 K/uL Final  . Basophils Relative 05/24/2014 0  0 - 1 % Final  . Basophils Absolute 05/24/2014 0.0  0.0 - 0.1 K/uL Final  . Sodium 05/24/2014 137  135 - 145 mmol/L Final  . Potassium 05/24/2014 4.5  3.5 - 5.1 mmol/L Final  . Chloride 05/24/2014 107  101 - 111 mmol/L Final  . CO2 05/24/2014 20* 22 - 32 mmol/L Final  . Glucose, Bld 05/24/2014 96  65 - 99 mg/dL Final  . BUN 05/24/2014 50* 6 - 20 mg/dL Final  . Creatinine, Ser 05/24/2014 1.30* 0.44 - 1.00 mg/dL Final  . Calcium 05/24/2014 9.3  8.9 - 10.3 mg/dL Final  . Total Protein 05/24/2014 7.1  6.5 - 8.1 g/dL Final  . Albumin 05/24/2014 3.9  3.5 - 5.0 g/dL Final  . AST 05/24/2014 30  15 - 41 U/L Final  . ALT 05/24/2014 18  14 - 54 U/L Final  . Alkaline Phosphatase 05/24/2014 103  38 - 126 U/L Final  . Total Bilirubin 05/24/2014 0.8  0.3 - 1.2 mg/dL Final  . GFR calc non Af Amer 05/24/2014 34* >60 mL/min Final  . GFR calc Af Amer 05/24/2014 40* >60 mL/min Final   Comment: (NOTE) The eGFR has been calculated using the CKD EPI equation. This calculation has not been validated in all clinical situations. eGFR's persistently <60 mL/min signify possible Chronic Kidney Disease.   . Anion gap 05/24/2014 10  5 - 15 Final  . B Natriuretic Peptide 05/24/2014 587.9* 0.0 - 100.0 pg/mL Final  . Troponin I 05/24/2014 <  0.03  <0.031 ng/mL Final   Comment:        NO INDICATION OF MYOCARDIAL INJURY.   . MRSA by PCR 05/24/2014 NEGATIVE  NEGATIVE Final   Comment:        The GeneXpert MRSA Assay (FDA approved for NASAL specimens only), is one component of a comprehensive MRSA colonization surveillance program. It is not intended to diagnose MRSA infection nor to guide or monitor treatment for MRSA infections.   . Sodium 05/25/2014 137  135 - 145 mmol/L Final  . Potassium 05/25/2014 4.3  3.5 - 5.1 mmol/L  Final  . Chloride 05/25/2014 106  101 - 111 mmol/L Final  . CO2 05/25/2014 20* 22 - 32 mmol/L Final  . Glucose, Bld 05/25/2014 97  65 - 99 mg/dL Final  . BUN 05/25/2014 42* 6 - 20 mg/dL Final  . Creatinine, Ser 05/25/2014 1.26* 0.44 - 1.00 mg/dL Final  . Calcium 05/25/2014 9.1  8.9 - 10.3 mg/dL Final  . GFR calc non Af Amer 05/25/2014 36* >60 mL/min Final  . GFR calc Af Amer 05/25/2014 41* >60 mL/min Final   Comment: (NOTE) The eGFR has been calculated using the CKD EPI equation. This calculation has not been validated in all clinical situations. eGFR's persistently <60 mL/min signify possible Chronic Kidney Disease.   . Anion gap 05/25/2014 11  5 - 15 Final  . Troponin I 05/25/2014 <0.03  <0.031 ng/mL Final   Comment:        NO INDICATION OF MYOCARDIAL INJURY.   . Sodium 05/26/2014 137  135 - 145 mmol/L Final  . Potassium 05/26/2014 4.3  3.5 - 5.1 mmol/L Final  . Chloride 05/26/2014 103  101 - 111 mmol/L Final  . CO2 05/26/2014 25  22 - 32 mmol/L Final  . Glucose, Bld 05/26/2014 97  65 - 99 mg/dL Final  . BUN 05/26/2014 41* 6 - 20 mg/dL Final  . Creatinine, Ser 05/26/2014 1.38* 0.44 - 1.00 mg/dL Final  . Calcium 05/26/2014 9.3  8.9 - 10.3 mg/dL Final  . GFR calc non Af Amer 05/26/2014 32* >60 mL/min Final  . GFR calc Af Amer 05/26/2014 37* >60 mL/min Final   Comment: (NOTE) The eGFR has been calculated using the CKD EPI equation. This calculation has not been validated in all clinical situations. eGFR's persistently <60 mL/min signify possible Chronic Kidney Disease.   . Anion gap 05/26/2014 9  5 - 15 Final  . Sodium 05/27/2014 138  135 - 145 mmol/L Final  . Potassium 05/27/2014 4.5  3.5 - 5.1 mmol/L Final  . Chloride 05/27/2014 104  101 - 111 mmol/L Final  . CO2 05/27/2014 25  22 - 32 mmol/L Final  . Glucose, Bld 05/27/2014 94  65 - 99 mg/dL Final  . BUN 05/27/2014 45* 6 - 20 mg/dL Final  . Creatinine, Ser 05/27/2014 1.40* 0.44 - 1.00 mg/dL Final  . Calcium 05/27/2014  8.9  8.9 - 10.3 mg/dL Final  . GFR calc non Af Amer 05/27/2014 31* >60 mL/min Final  . GFR calc Af Amer 05/27/2014 36* >60 mL/min Final   Comment: (NOTE) The eGFR has been calculated using the CKD EPI equation. This calculation has not been validated in all clinical situations. eGFR's persistently <60 mL/min signify possible Chronic Kidney Disease.   . Anion gap 05/27/2014 9  5 - 15 Final     Assessment/Plan  1. Systolic and diastolic CHF, chronic Continue current medications. -BNP, CMP next week  2. Chronic kidney disease, stage III (  moderate) -CMP, future  3. Essential hypertension -CMP future  4. Hypothyroidism, unspecified hypothyroidism type -TSH future  5. Right knee pain -discontinue physical therapy  6. Dyspnea -BMP, future

## 2014-06-19 LAB — HEPATIC FUNCTION PANEL
ALT: 17 U/L (ref 7–35)
AST: 28 U/L (ref 13–35)
Alkaline Phosphatase: 90 U/L (ref 25–125)
BILIRUBIN, TOTAL: 0.6 mg/dL

## 2014-06-19 LAB — BASIC METABOLIC PANEL
BUN: 46 mg/dL — AB (ref 4–21)
Creatinine: 1.3 mg/dL — AB (ref 0.5–1.1)
GLUCOSE: 91 mg/dL
POTASSIUM: 4.3 mmol/L (ref 3.4–5.3)
SODIUM: 141 mmol/L (ref 137–147)

## 2014-06-19 LAB — TSH: TSH: 3.35 u[IU]/mL (ref 0.41–5.90)

## 2014-06-20 ENCOUNTER — Encounter: Payer: Self-pay | Admitting: *Deleted

## 2014-06-26 ENCOUNTER — Other Ambulatory Visit: Payer: Self-pay | Admitting: Cardiology

## 2014-06-27 ENCOUNTER — Telehealth: Payer: Self-pay | Admitting: *Deleted

## 2014-06-27 NOTE — Telephone Encounter (Signed)
Received labwork from [facility] 06/19/2014. Abstracted and Given to Dr. Nyoka Cowden to review. Received back today to call patient. Per Dr. Cyndia Skeeters patient. Lab ok. Renal Function better. BNP is improved.  Patient Notified and understood.

## 2014-06-28 ENCOUNTER — Encounter: Payer: Self-pay | Admitting: Internal Medicine

## 2014-07-13 LAB — HEMOGLOBIN A1C: HEMOGLOBIN A1C: 5.4 % (ref 4.0–6.0)

## 2014-07-13 LAB — TSH: TSH: 3.3 u[IU]/mL (ref 0.41–5.90)

## 2014-07-13 LAB — BASIC METABOLIC PANEL
BUN: 52 mg/dL — AB (ref 4–21)
Creatinine: 1.4 mg/dL — AB (ref 0.5–1.1)
Potassium: 4.3 mmol/L (ref 3.4–5.3)
Sodium: 134 mmol/L — AB (ref 137–147)

## 2014-07-13 LAB — HEPATIC FUNCTION PANEL
ALT: 14 U/L (ref 7–35)
AST: 21 U/L (ref 13–35)
Alkaline Phosphatase: 118 U/L (ref 25–125)
Bilirubin, Total: 0.5 mg/dL

## 2014-07-13 LAB — CBC AND DIFFERENTIAL
HEMATOCRIT: 33 % — AB (ref 36–46)
HEMOGLOBIN: 11.1 g/dL — AB (ref 12.0–16.0)
PLATELETS: 299 10*3/uL (ref 150–399)
WBC: 9.5 10^3/mL

## 2014-07-16 ENCOUNTER — Non-Acute Institutional Stay: Payer: Medicare Other | Admitting: Nurse Practitioner

## 2014-07-16 ENCOUNTER — Encounter: Payer: Self-pay | Admitting: Nurse Practitioner

## 2014-07-16 DIAGNOSIS — N183 Chronic kidney disease, stage 3 unspecified: Secondary | ICD-10-CM

## 2014-07-16 DIAGNOSIS — I10 Essential (primary) hypertension: Secondary | ICD-10-CM

## 2014-07-16 DIAGNOSIS — E039 Hypothyroidism, unspecified: Secondary | ICD-10-CM | POA: Diagnosis not present

## 2014-07-16 DIAGNOSIS — J209 Acute bronchitis, unspecified: Secondary | ICD-10-CM

## 2014-07-16 DIAGNOSIS — F411 Generalized anxiety disorder: Secondary | ICD-10-CM | POA: Insufficient documentation

## 2014-07-16 DIAGNOSIS — I5042 Chronic combined systolic (congestive) and diastolic (congestive) heart failure: Secondary | ICD-10-CM | POA: Diagnosis not present

## 2014-07-16 DIAGNOSIS — N182 Chronic kidney disease, stage 2 (mild): Secondary | ICD-10-CM

## 2014-07-16 NOTE — Assessment & Plan Note (Signed)
07/12/14 CXR ongoing congestive heart failure, central pulmonary vascular congestion and pulmonary edema. 7 day course of Levaquin 500mg  daily started, CBC, CMP, TSH, A1c, UA pending.

## 2014-07-16 NOTE — Assessment & Plan Note (Addendum)
Prn Diazepam q6h prn available to her for anxiety and muscle spasm.

## 2014-07-16 NOTE — Assessment & Plan Note (Signed)
Update CMP, may consider tapering off Losartan and Furosemide if Bun/creat changed significantly.

## 2014-07-16 NOTE — Assessment & Plan Note (Signed)
Controlled, takes Furosemide 40mg , Losartain 25mg , Metoprolol 12.5mg  daily.

## 2014-07-16 NOTE — Assessment & Plan Note (Addendum)
07/12/14 CXR CHF and pulmonary edema, 7 day course of Levaquin in setting of worsened cough and yellow phlegm production, continue Furosemide 40mg  daily, update CMP 07/19/14 weight stable.

## 2014-07-16 NOTE — Progress Notes (Signed)
Patient ID: Ann [name], female   DOB: 1918-11-15, 81 y.o.   MRN: [id number]   Code Status: DNR  Allergies  Allergen Reactions  . Protonix [Pantoprazole Sodium] Rash  . Ace Inhibitors Cough  . Amoxicillin Diarrhea  . Biaxin [Clarithromycin]     Noted on info sheet from independent living  . Codeine   . Darvon   . Ibuprofen Other (See Comments)    Noted on info sheet from independent living  . Mercurochrome [Merbromin (Mercurochrome)]   . Pantoprazole     Dizzy  . Prilosec [Omeprazole] Other (See Comments)    Noted on info sheet from independent living  . Sulfa Drugs Cross Reactors   . Tramadol     Abnormal kidney function  . Vesicare [Solifenacin]     Drys out nose and throat     Chief Complaint  Patient presents with  . Medical Management of Chronic Issues  . Acute Visit    cough, yellow phlegm    HPI: Patient is a 81 y.o. female seen in the [facility] at [facility] today for evaluation of congestive cough and other chronic medical conditions.  Problem List Items Addressed This Visit    Hypothyroidism (Chronic)    Takes Levothyroxine 22mcg, update TSH      Chronic kidney disease, stage III (moderate) (Chronic)    Update CMP, may consider tapering off Losartan and Furosemide if Bun/creat changed significantly.       Hypertension (Chronic)    Controlled, takes Furosemide 40mg , Losartain 25mg , Metoprolol 12.5mg  daily.       Systolic and diastolic CHF, chronic (Chronic)    07/12/14 CXR CHF and pulmonary edema, 7 day course of Levaquin in setting of worsened cough and yellow phlegm production, continue Furosemide 40mg  daily, update CMP 07/19/14 weight stable.       Acute bronchitis - Primary    07/12/14 CXR ongoing congestive heart failure, central pulmonary vascular congestion and pulmonary edema. 7 day course of Levaquin 500mg  daily started, CBC, CMP, TSH, A1c, UA pending.       Generalized anxiety disorder    Prn Diazepam q6h prn available to her for  anxiety and muscle spasm.       CKD (chronic kidney disease), stage II    07/13/14 Bun 52, creatinine 1.44         Review of Systems:  Review of Systems  Constitutional: Negative.  Negative for fever, chills and appetite change.  HENT: Positive for hearing loss.   Eyes: Negative.   Respiratory: Positive for cough and shortness of breath.   Cardiovascular: Negative.        Hospitalized May 7846 for acute systolic and diastolic congestive heart failure.  Gastrointestinal: Negative.  Negative for nausea, vomiting and abdominal pain.  Genitourinary: Positive for frequency. Negative for urgency, difficulty urinating, vaginal pain and pelvic pain.       Stress incontinence.  Musculoskeletal: Positive for gait problem.       Acute increase in the medial side of the right knee. Chronic knee pains. Uses a walker.  Skin: Negative for pallor and rash.  Allergic/Immunologic: Negative.   Neurological: Negative.  Negative for speech difficulty, weakness, numbness and headaches.  Hematological: Negative.   Psychiatric/Behavioral: Negative for confusion, sleep disturbance and agitation.       Depressive symptoms come and go.    Past Medical History  Diagnosis Date  . Mitral regurgitation   . Hypertension   . Palpitations   . Nocturnal dyspnea   .  Advanced age   . Chronic diastolic CHF (congestive heart failure)     Last echo in 5/11  . Pain in joint, lower leg 02/25/2012  . Pain in joint, lower leg 02/23/2012  . Encounter for long-term (current) use of other medications 10/02/2011  . Cough 07/17/2011  . Internal hemorrhoids without mention of complication 54/00/8676  . Unspecified constipation 01/01/2011  . Rash and other nonspecific skin eruption 08/14/2010  . Tension headache 07/31/2010  . Benign paroxysmal positional vertigo 07/24/2010  . Cervicalgia 07/24/2010  . Urinary tract infection, site not specified 07/10/2010  . Abnormality of gait 05/09/2009  . Vitamin D deficiency 01/25/2009  .  Lumbago 01/05/2008  . Cervicitis and endocervicitis 12/22/2007  . Actinic keratosis 12/22/2007  . Pain in limb 12/22/2007  . Muscle weakness (generalized) 08/04/2007  . Blepharochalasis 05/12/2007  . Insomnia, unspecified 05/12/2007  . Dermatophytosis of foot 07/14/2006  . Dizziness and giddiness 06/25/2006  . Allergic rhinitis due to pollen 04/21/2006  . Dyskinesia of esophagus 04/12/2006  . Other malaise and fatigue 04/01/2006  . Unspecified urinary incontinence 02/10/2006  . Cramp of limb 10/07/2005  . Restless legs syndrome (RLS) 06/30/2005  . CKD (chronic kidney disease), stage III   . Hypothyroidism   . Diaphragmatic hernia without mention of obstruction or gangrene 10/03/2003  . Unspecified venous (peripheral) insufficiency 01/05/1998  . Reflux esophagitis 01/06/1996  . Peptic ulcer, unspecified site, unspecified as acute or chronic, without mention of hemorrhage, perforation, or obstruction 01/06/1996  . Diverticulosis of colon (without mention of hemorrhage) 01/06/1996  . Pain in right knee 08/08/2012  . Macular degeneration of right eye 1980's  . Macular degeneration 09/21/2013  . Varicose vein of leg 02/08/2014  . Mitral valve regurgitation     Moderate-severe by echo 05/2014  . Systolic and diastolic CHF, chronic    Past Surgical History  Procedure Laterality Date  . Tonsillectomy and adenoidectomy  1940  . Appendectomy  1940's  . Oophorectomy  1940's  . Breast surgery  1950's    benign tumors removed  . Cataract extraction w/ intraocular lens  implant, bilateral  1982  . Lesion excision  04/2002    melanomas of scalp  Dr. Link Snuffer  . Skin cancer excision  2005    BCC of right temple  . Cholecystectomy, laparoscopic  10/2004    Dr. Deon Pilling  . Tooth extraction  06/2008    and bone implant  Dr. Matilde Haymaker  . Colonoscopy  2000   Social History:   reports that she has never smoked. She has never used smokeless tobacco. She reports that she does not drink alcohol or use illicit  drugs.  Family History  Problem Relation Age of Onset  . Stroke Mother   . Heart disease Father   . Diabetes Father   . Heart disease Brother     MI  . Alcohol abuse Sister     Medications: Patient's Medications  New Prescriptions   No medications on file  Previous Medications   ACETAMINOPHEN (TYLENOL) 500 MG TABLET    Take 500 mg by mouth as needed for pain (takes 2 per day or prn).   ASPIRIN 81 MG TABLET    Take 81 mg by mouth daily.     BENZONATATE (TESSALON) 100 MG CAPSULE    Take 1 capsule (100 mg total) by mouth 3 (three) times daily as needed for cough.   CALCIUM & MAGNESIUM CARBONATES (MYLANTA PO)    Take 30 mLs by mouth as needed (indigestion).  CALCIUM-VITAMIN D (OSCAL WITH D) 500-200 MG-UNIT PER TABLET    Take 1 tablet by mouth daily with breakfast.   CETIRIZINE (ZYRTEC) 10 MG TABLET    Take 5 mg by mouth daily. Takes 1/2 tablet each day.   CHOLECALCIFEROL (VITAMIN D3) 1000 UNITS CAPS    Take by mouth daily.   DIAZEPAM (VALIUM) 2 MG TABLET    Take 1 tablet (2 mg total) by mouth every 6 (six) hours as needed for anxiety or muscle spasms.   FLUTICASONE (FLONASE) 50 MCG/ACT NASAL SPRAY    Place into both nostrils daily.   FUROSEMIDE (LASIX) 40 MG TABLET    Take 1 tablet daily.  If you gain > 3 lbs in 24 hours or 5 lbs in 1 week, take 1 extra dose.   GUAIFENESIN (MUCINEX) 600 MG 12 HR TABLET    Take 600 mg by mouth as needed for cough.   LEVOTHYROXINE (SYNTHROID, LEVOTHROID) 88 MCG TABLET    Take 1 tablet (88 mcg total) by mouth daily before breakfast.   LOSARTAN (COZAAR) 25 MG TABLET    Take 25 mg by mouth daily.   MECLIZINE (ANTIVERT) 25 MG TABLET    Take 25 mg by mouth as needed for dizziness.    METOPROLOL TARTRATE (LOPRESSOR) 25 MG TABLET    TAKE (1/2) TABLET DAILY.   MULTIVITAMIN (THERAGRAN) PER TABLET    Take 1 tablet by mouth daily.     NYSTATIN CREAM (MYCOSTATIN)    Apply topically 2 (two) times daily.   POTASSIUM CHLORIDE SA (K-DUR,KLOR-CON) 20 MEQ TABLET    Take  1 tablet (20 mEq total) by mouth daily.   PREMARIN VAGINAL CREAM    Place 1 Applicatorful vaginally once a week.   Modified Medications   No medications on file  Discontinued Medications   No medications on file     Physical Exam: Physical Exam  Constitutional: She is oriented to person, place, and time. She appears well-developed and well-nourished. No distress.  HENT:  Head: Normocephalic and atraumatic.  Right Ear: External ear normal.  Left Ear: External ear normal.  Nose: Nose normal.  Mouth/Throat: Oropharynx is clear and moist.  Loss of hearing bilaterally.  Eyes: Conjunctivae and EOM are normal. Pupils are equal, round, and reactive to light.  Corrective lenses and prisms on her glasses.  Neck: No JVD present. No tracheal deviation present. No thyromegaly present.  Cardiovascular: Normal rate and regular rhythm.   No murmur heard. Regular rhythm. Grade 3/6 systolic ejection murmur at left sternal border. No heaves or thrills on palpation. Dorsalis pedis and posterior tibial arteries have diminished pulses bilaterally. Varicose leg veins bilaterally.  Pulmonary/Chest: Effort normal. She has rales. She exhibits no tenderness.  Abdominal: She exhibits no mass. There is no tenderness.  Musculoskeletal: She exhibits no edema.  From previous exam: Exquisitely tender in the medial side of the right knee. Crepitance in both knees. She is able to use her walker and a cane and walk unassisted.  Lymphadenopathy:    She has no cervical adenopathy.  Neurological: She is alert and oriented to person, place, and time. No cranial nerve deficit. Coordination normal.  Reduced vibratory sensation in the right leg and foot.  Skin: No rash noted. No erythema. No pallor.  Psychiatric: She has a normal mood and affect. Her behavior is normal. Judgment and thought content normal.    Filed Vitals:   07/16/14 1452  BP: 126/66  Pulse: 70  Temp: 99.1 F (37.3 C)  TempSrc:  Tympanic  Resp: 18       Labs reviewed: Basic Metabolic Panel:  Recent Labs  09/14/13  05/26/14 0515 05/27/14 0418 05/29/14 1433 06/07/14 06/19/14 07/13/14  NA 137  < > 137 138 134* 139 141 134*  K 4.3  < > 4.3 4.5 4.6 4.2 4.3 4.3  CL  --   < > 103 104 102  --   --   --   CO2  --   < > 25 25 25   --   --   --   GLUCOSE  --   < > 97 94 87  --   --   --   BUN 44*  < > 41* 45* 57* 46* 46* 52*  CREATININE 1.2*  < > 1.38* 1.40* 1.86* 1.4* 1.3* 1.4*  CALCIUM  --   < > 9.3 8.9 9.1  --   --   --   TSH 3.82  --   --   --   --   --  3.35 3.30  < > = values in this interval not displayed. Liver Function Tests:  Recent Labs  05/24/14 0326 06/19/14 07/13/14  AST 30 28 21   ALT 18 17 14   ALKPHOS 103 90 118  BILITOT 0.8  --   --   PROT 7.1  --   --   ALBUMIN 3.9  --   --    No results for input(s): LIPASE, AMYLASE in the last 8760 hours. No results for input(s): AMMONIA in the last 8760 hours. CBC:  Recent Labs  05/24/14 0326 07/13/14  WBC 8.5 9.5  NEUTROABS 6.3  --   HGB 12.5 11.1*  HCT 37.0 33*  MCV 101.4*  --   PLT 267 299   Lipid Panel: No results for input(s): CHOL, HDL, LDLCALC, TRIG, CHOLHDL, LDLDIRECT in the last 8760 hours.   Past Procedures:  07/12/14 CXR ongoing congestive heart failure, central pulmonary vascular congestion and pulmonary edema.   Assessment/Plan Acute bronchitis 07/12/14 CXR ongoing congestive heart failure, central pulmonary vascular congestion and pulmonary edema. 7 day course of Levaquin 500mg  daily started, CBC, CMP, TSH, A1c, UA pending.   Systolic and diastolic CHF, chronic 09/06/09 CXR CHF and pulmonary edema, 7 day course of Levaquin in setting of worsened cough and yellow phlegm production, continue Furosemide 40mg  daily, update CMP 07/19/14 weight stable.   Hypertension Controlled, takes Furosemide 40mg , Losartain 25mg , Metoprolol 12.5mg  daily.   Hypothyroidism Takes Levothyroxine 68mcg, update TSH  Chronic kidney disease, stage III (moderate) Update  CMP, may consider tapering off Losartan and Furosemide if Bun/creat changed significantly.   Generalized anxiety disorder Prn Diazepam q6h prn available to her for anxiety and muscle spasm.   CKD (chronic kidney disease), stage II 07/13/14 Bun 52, creatinine 1.44    Family/ Staff Communication: observe the patient.   Goals of Care: AL  Labs/tests ordered: CXR done 07/12/14, pending CBC, CMP, TSH, A1c, UA C/S

## 2014-07-16 NOTE — Assessment & Plan Note (Signed)
Takes Levothyroxine 85mcg, update TSH

## 2014-07-17 ENCOUNTER — Encounter: Payer: Self-pay | Admitting: Internal Medicine

## 2014-07-19 DIAGNOSIS — N182 Chronic kidney disease, stage 2 (mild): Secondary | ICD-10-CM | POA: Insufficient documentation

## 2014-07-19 NOTE — Assessment & Plan Note (Signed)
07/13/14 Bun 52, creatinine 1.44

## 2014-08-01 ENCOUNTER — Ambulatory Visit (INDEPENDENT_AMBULATORY_CARE_PROVIDER_SITE_OTHER): Payer: Medicare Other | Admitting: Cardiology

## 2014-08-01 ENCOUNTER — Encounter: Payer: Self-pay | Admitting: Cardiology

## 2014-08-01 DIAGNOSIS — I119 Hypertensive heart disease without heart failure: Secondary | ICD-10-CM

## 2014-08-01 NOTE — Progress Notes (Signed)
Cardiology Office Note   Date:  08/01/2014   ID:  Ann [name], DOB 04/28/1918, MRN [id number]  PCP:  Estill Dooms, MD  Cardiologist: Darlin Coco MD  No chief complaint on file.     History of Present Illness: Ann [name] is a 81 y.o. female who presents for follow-up office visit.  This pleasant 81 year old woman is seen for a post hospital office visit. She has a past history of palpitations and supraventricular tachycardia. She also has a history of mitral regurgitation. She had an echocardiogram in May 2011 which showed normal left ventricular systolic function with an ejection fraction of 55-60% and an impaired relaxation. She has had a past history of congestive heart failure secondary to diastolic dysfunction. She does not have any history of ischemic heart disease. She has a history of arthritis and previously had been on tramadol which was recently stopped by her primary care physician because of worsening renal function. She was seen as a work in September 2013 complaining of palpitations and she wore a 30 day monitor from 09/09/11 until 10/08/11 and the monitor did not show any significant arrhythmias. She was in normal sinus rhythm with occasional PVCs and PACs.   The patient was admitted to the hospital 5/19 until 05/2214 with acute on chronic systolic and diastolic heart failure and moderate to severe mitral regurgitation. She responded to Lasix. Conservative management was undertaken. She did not require TEE. Because of her advanced age, she is not a candidate for surgery on her mitral valve. Since discharge from the hospital she has been doing well. Her weight has been stable.  She is now residing in an assisted living facility.  Her blood pressure has been somewhat labile.  The nursing staff has been holding her beta blocker at times.  The patient continues to have some problems with peripheral edema.  Past Medical History  Diagnosis Date  . Mitral  regurgitation   . Hypertension   . Palpitations   . Nocturnal dyspnea   . Advanced age   . Chronic diastolic CHF (congestive heart failure)     Last echo in 5/11  . Pain in joint, lower leg 02/25/2012  . Pain in joint, lower leg 02/23/2012  . Encounter for long-term (current) use of other medications 10/02/2011  . Cough 07/17/2011  . Internal hemorrhoids without mention of complication 76/73/4193  . Unspecified constipation 01/01/2011  . Rash and other nonspecific skin eruption 08/14/2010  . Tension headache 07/31/2010  . Benign paroxysmal positional vertigo 07/24/2010  . Cervicalgia 07/24/2010  . Urinary tract infection, site not specified 07/10/2010  . Abnormality of gait 05/09/2009  . Vitamin D deficiency 01/25/2009  . Lumbago 01/05/2008  . Cervicitis and endocervicitis 12/22/2007  . Actinic keratosis 12/22/2007  . Pain in limb 12/22/2007  . Muscle weakness (generalized) 08/04/2007  . Blepharochalasis 05/12/2007  . Insomnia, unspecified 05/12/2007  . Dermatophytosis of foot 07/14/2006  . Dizziness and giddiness 06/25/2006  . Allergic rhinitis due to pollen 04/21/2006  . Dyskinesia of esophagus 04/12/2006  . Other malaise and fatigue 04/01/2006  . Unspecified urinary incontinence 02/10/2006  . Cramp of limb 10/07/2005  . Restless legs syndrome (RLS) 06/30/2005  . CKD (chronic kidney disease), stage III   . Hypothyroidism   . Diaphragmatic hernia without mention of obstruction or gangrene 10/03/2003  . Unspecified venous (peripheral) insufficiency 01/05/1998  . Reflux esophagitis 01/06/1996  . Peptic ulcer, unspecified site, unspecified as acute or chronic, without mention of hemorrhage, perforation, or  obstruction 01/06/1996  . Diverticulosis of colon (without mention of hemorrhage) 01/06/1996  . Pain in right knee 08/08/2012  . Macular degeneration of right eye 1980's  . Macular degeneration 09/21/2013  . Varicose vein of leg 02/08/2014  . Mitral valve regurgitation     Moderate-severe by echo 05/2014  .  Systolic and diastolic CHF, chronic     Past Surgical History  Procedure Laterality Date  . Tonsillectomy and adenoidectomy  1940  . Appendectomy  1940's  . Oophorectomy  1940's  . Breast surgery  1950's    benign tumors removed  . Cataract extraction w/ intraocular lens  implant, bilateral  1982  . Lesion excision  04/2002    melanomas of scalp  Dr. Link Snuffer  . Skin cancer excision  2005    BCC of right temple  . Cholecystectomy, laparoscopic  10/2004    Dr. Deon Pilling  . Tooth extraction  06/2008    and bone implant  Dr. Matilde Haymaker  . Colonoscopy  2000     Current Outpatient Prescriptions  Medication Sig Dispense Refill  . acetaminophen (TYLENOL) 500 MG tablet Take 500 mg by mouth as needed for pain (takes 2 per day or prn).    Marland Kitchen aspirin 81 MG tablet Take 81 mg by mouth daily.      . benzonatate (TESSALON) 100 MG capsule Take 1 capsule (100 mg total) by mouth 3 (three) times daily as needed for cough. 20 capsule 0  . Calcium & Magnesium Carbonates (MYLANTA PO) Take 30 mLs by mouth as needed (indigestion).     . calcium-vitamin D (OSCAL WITH D) 500-200 MG-UNIT per tablet Take 1 tablet by mouth daily with breakfast.    . cetirizine (ZYRTEC) 10 MG tablet Take 5 mg by mouth daily. Takes 1/2 tablet each day.    . Cholecalciferol (VITAMIN D3) 1000 UNITS CAPS Take by mouth daily.    . diazepam (VALIUM) 2 MG tablet Take 1 tablet (2 mg total) by mouth every 6 (six) hours as needed for anxiety or muscle spasms. 30 tablet 0  . fluticasone (FLONASE) 50 MCG/ACT nasal spray Place into both nostrils daily.    . furosemide (LASIX) 40 MG tablet Take 1 tablet daily.  If you gain > 3 lbs in 24 hours or 5 lbs in 1 week, take 1 extra dose. 60 tablet 3  . guaiFENesin (MUCINEX) 600 MG 12 hr tablet Take 600 mg by mouth as needed for cough.    . levothyroxine (SYNTHROID, LEVOTHROID) 88 MCG tablet Take 1 tablet (88 mcg total) by mouth daily before breakfast. 30 tablet 3  . losartan (COZAAR) 25 MG tablet  Take 25 mg by mouth daily.    . meclizine (ANTIVERT) 25 MG tablet Take 25 mg by mouth as needed for dizziness.     . metoprolol tartrate (LOPRESSOR) 25 MG tablet TAKE (1/2) TABLET DAILY. 30 tablet 3  . multivitamin (THERAGRAN) per tablet Take 1 tablet by mouth daily.      Marland Kitchen nystatin cream (MYCOSTATIN) Apply topically 2 (two) times daily. 30 g 0  . potassium chloride SA (K-DUR,KLOR-CON) 20 MEQ tablet Take 1 tablet (20 mEq total) by mouth daily. 30 tablet 3  . PREMARIN vaginal cream Place 1 Applicatorful vaginally once a week.      No current facility-administered medications for this visit.    Allergies:   Protonix; Ace inhibitors; Amoxicillin; Biaxin; Codeine; Darvon; Ibuprofen; Mercurochrome; Pantoprazole; Prilosec; Sulfa drugs cross reactors; Tramadol; and Vesicare    Social History:  The patient  reports that she has never smoked. She has never used smokeless tobacco. She reports that she does not drink alcohol or use illicit drugs.   Family History:  The patient's family history includes Alcohol abuse in her sister; Diabetes in her father; Heart disease in her brother and father; Stroke in her mother.    ROS:  Please see the history of present illness.   Otherwise, review of systems are positive for none.   All other systems are reviewed and negative.    PHYSICAL EXAM: VS:  There were no vitals taken for this visit. , BMI There is no weight on file to calculate BMI. GEN: Well nourished, well developed, in no acute distress HEENT: normal Neck: no JVD, carotid bruits, or masses Cardiac: RRR; there is a grade 1/6 apical systolic murmur.  There is 2+ pretibial and pedal edema bilaterally. Respiratory:  clear to auscultation bilaterally, normal work of breathing GI: soft, nontender, nondistended, + BS MS: no deformity or atrophy Skin: warm and dry, no rash Neuro:  Strength and sensation are intact Psych: euthymic mood, full affect   EKG:  EKG is ordered today. The ekg ordered  today demonstrates normal sinus rhythm with occasional PACs.  Left axis deviation.  Nonspecific ST-T wave changes.   Recent Labs: 05/24/2014: B Natriuretic Peptide 587.9* 07/13/2014: ALT 14; BUN 52*; Creatinine 1.4*; Hemoglobin 11.1*; Platelets 299; Potassium 4.3; Sodium 134*; TSH 3.30    Lipid Panel    Component Value Date/Time   CHOL 174 06/07/2012   TRIG 98 06/07/2012   HDL 65 06/07/2012   LDLCALC 89 06/07/2012      Wt Readings from Last 3 Encounters:  06/14/14 169 lb (76.658 kg)  05/29/14 166 lb 1.9 oz (75.352 kg)  05/27/14 167 lb 8.8 oz (76 kg)         ASSESSMENT AND PLAN:  1. Recent exacerbation of acute on chronic systolic and diastolic heart failure. 2. Moderate to severe mitral regurgitation 3. Chronic kidney disease stage III with discharge creatinine was 1.4 4. Hypothyroidism. On Synthroid 5. Essential hypertension   Current medicines are reviewed at length with the patient today.  The patient has concerns regarding medicines.  The following changes have been made:  We gave her written instructions to the nursing service at the assisted living to hold the patient's Lopressor if the systolic blood pressure was less than 100.  We will not hold the Lopressor based upon her diastolic blood pressure readings.  Labs/ tests ordered today include:  No orders of the defined types were placed in this encounter.      Disposition: Continue same medications.  Continue to try to keep legs elevated to help with dependent edema.  Recheck in 4 months for office visit.  Berna Spare MD 08/01/2014 6:02 PM    [facility] [place], [place], [place]  [address] Phone: [phone]; Fax: [phone]

## 2014-08-01 NOTE — Patient Instructions (Signed)
Medication Instructions:  Your physician recommends that you continue on your current medications as directed. Please refer to the Current Medication list given to you today.  Labwork: NONE  Testing/Procedures: NONE  Follow-Up: Your physician recommends that you schedule a follow-up appointment in: 4 MONTH OV  Any Other Special Instructions Will Be Listed Below (If Applicable). HOLD LOPRESSOR (METOPROLOL) IF SYSTOLIC (TOP NUMBER) IS LESS THAN 100  OK TO USE BIOTENE SPRAY-CAN SELF ADMINISTER

## 2014-08-03 ENCOUNTER — Encounter: Payer: Self-pay | Admitting: Nurse Practitioner

## 2014-08-28 ENCOUNTER — Ambulatory Visit: Payer: Medicare Other | Admitting: Cardiology

## 2014-08-28 ENCOUNTER — Other Ambulatory Visit: Payer: Medicare Other

## 2014-09-27 ENCOUNTER — Encounter (HOSPITAL_COMMUNITY): Payer: Self-pay

## 2014-09-27 ENCOUNTER — Emergency Department (HOSPITAL_COMMUNITY)
Admission: EM | Admit: 2014-09-27 | Discharge: 2014-09-27 | Disposition: A | Discharge status: Home / Self Care | Payer: Medicare Other | Attending: Physician Assistant | Admitting: Physician Assistant

## 2014-09-27 ENCOUNTER — Emergency Department (HOSPITAL_COMMUNITY): Payer: Medicare Other

## 2014-09-27 DIAGNOSIS — E559 Vitamin D deficiency, unspecified: Secondary | ICD-10-CM | POA: Insufficient documentation

## 2014-09-27 DIAGNOSIS — I340 Nonrheumatic mitral (valve) insufficiency: Secondary | ICD-10-CM | POA: Diagnosis not present

## 2014-09-27 DIAGNOSIS — Z880 Allergy status to penicillin: Secondary | ICD-10-CM | POA: Diagnosis not present

## 2014-09-27 DIAGNOSIS — Z8711 Personal history of peptic ulcer disease: Secondary | ICD-10-CM | POA: Insufficient documentation

## 2014-09-27 DIAGNOSIS — N183 Chronic kidney disease, stage 3 (moderate): Secondary | ICD-10-CM | POA: Insufficient documentation

## 2014-09-27 DIAGNOSIS — R079 Chest pain, unspecified: Secondary | ICD-10-CM | POA: Insufficient documentation

## 2014-09-27 DIAGNOSIS — Z7982 Long term (current) use of aspirin: Secondary | ICD-10-CM | POA: Insufficient documentation

## 2014-09-27 DIAGNOSIS — R0602 Shortness of breath: Secondary | ICD-10-CM | POA: Diagnosis not present

## 2014-09-27 DIAGNOSIS — Z7951 Long term (current) use of inhaled steroids: Secondary | ICD-10-CM | POA: Insufficient documentation

## 2014-09-27 DIAGNOSIS — R011 Cardiac murmur, unspecified: Secondary | ICD-10-CM | POA: Insufficient documentation

## 2014-09-27 DIAGNOSIS — N184 Chronic kidney disease, stage 4 (severe): Secondary | ICD-10-CM | POA: Diagnosis present

## 2014-09-27 DIAGNOSIS — Z872 Personal history of diseases of the skin and subcutaneous tissue: Secondary | ICD-10-CM | POA: Insufficient documentation

## 2014-09-27 DIAGNOSIS — Z8619 Personal history of other infectious and parasitic diseases: Secondary | ICD-10-CM | POA: Diagnosis not present

## 2014-09-27 DIAGNOSIS — Z8719 Personal history of other diseases of the digestive system: Secondary | ICD-10-CM | POA: Insufficient documentation

## 2014-09-27 DIAGNOSIS — I129 Hypertensive chronic kidney disease with stage 1 through stage 4 chronic kidney disease, or unspecified chronic kidney disease: Secondary | ICD-10-CM | POA: Diagnosis not present

## 2014-09-27 DIAGNOSIS — R600 Localized edema: Secondary | ICD-10-CM | POA: Diagnosis not present

## 2014-09-27 DIAGNOSIS — I5042 Chronic combined systolic (congestive) and diastolic (congestive) heart failure: Secondary | ICD-10-CM | POA: Diagnosis not present

## 2014-09-27 DIAGNOSIS — E039 Hypothyroidism, unspecified: Secondary | ICD-10-CM | POA: Diagnosis not present

## 2014-09-27 DIAGNOSIS — Z8669 Personal history of other diseases of the nervous system and sense organs: Secondary | ICD-10-CM | POA: Diagnosis not present

## 2014-09-27 DIAGNOSIS — Z79899 Other long term (current) drug therapy: Secondary | ICD-10-CM | POA: Insufficient documentation

## 2014-09-27 DIAGNOSIS — R0789 Other chest pain: Secondary | ICD-10-CM | POA: Diagnosis not present

## 2014-09-27 DIAGNOSIS — Z8739 Personal history of other diseases of the musculoskeletal system and connective tissue: Secondary | ICD-10-CM | POA: Insufficient documentation

## 2014-09-27 DIAGNOSIS — Z87440 Personal history of urinary (tract) infections: Secondary | ICD-10-CM | POA: Insufficient documentation

## 2014-09-27 LAB — BASIC METABOLIC PANEL
Anion gap: 8 (ref 5–15)
BUN: 46 mg/dL — AB (ref 6–20)
CO2: 26 mmol/L (ref 22–32)
Calcium: 9.4 mg/dL (ref 8.9–10.3)
Chloride: 107 mmol/L (ref 101–111)
Creatinine, Ser: 1.37 mg/dL — ABNORMAL HIGH (ref 0.44–1.00)
GFR calc Af Amer: 37 mL/min — ABNORMAL LOW (ref >60)
GFR, EST NON AFRICAN AMERICAN: 32 mL/min — AB (ref >60)
GLUCOSE: 113 mg/dL — AB (ref 65–99)
POTASSIUM: 4 mmol/L (ref 3.5–5.1)
Sodium: 141 mmol/L (ref 135–145)

## 2014-09-27 LAB — CBC
HEMATOCRIT: 36.7 % (ref 36.0–46.0)
HEMOGLOBIN: 11.8 g/dL — AB (ref 12.0–15.0)
MCH: 31.6 pg (ref 26.0–34.0)
MCHC: 32.2 g/dL (ref 30.0–36.0)
MCV: 98.4 fL (ref 78.0–100.0)
Platelets: 300 10*3/uL (ref 150–400)
RBC: 3.73 MIL/uL — ABNORMAL LOW (ref 3.87–5.11)
RDW: 13.8 % (ref 11.5–15.5)
WBC: 9.6 10*3/uL (ref 4.0–10.5)

## 2014-09-27 LAB — BRAIN NATRIURETIC PEPTIDE: B Natriuretic Peptide: 584.2 pg/mL — ABNORMAL HIGH (ref 0.0–100.0)

## 2014-09-27 LAB — I-STAT TROPONIN, ED
TROPONIN I, POC: 0.02 ng/mL (ref 0.00–0.08)
Troponin i, poc: 0.02 ng/mL (ref 0.00–0.08)

## 2014-09-27 MED ORDER — DOCUSATE SODIUM 100 MG PO CAPS: 100.0000 mg | ORAL_CAPSULE | Freq: Two times a day (BID) | ORAL | Status: DC

## 2014-09-27 MED ORDER — DOCUSATE SODIUM 100 MG PO CAPS: 100.0000 mg | ORAL_CAPSULE | Freq: Two times a day (BID) | ORAL | Status: AC

## 2014-09-27 MED ORDER — ACETAMINOPHEN 325 MG PO TABS
325.0000 mg | ORAL_TABLET | Freq: Once | ORAL | Status: DC
  Filled 2014-09-27: qty 1

## 2014-09-27 MED ORDER — ACETAMINOPHEN 325 MG PO TABS
650.0000 mg | ORAL_TABLET | Freq: Once | ORAL | Status: AC
  Administered 2014-09-27: 650 mg via ORAL

## 2014-09-27 NOTE — ED Notes (Signed)
Pt reports left sided breast pain that radiates into her left shoulder blade.  Pt reports she woke up this morning with the pain and was given Maalox at her facility with minimal relief.  Pt is from [facility].  Pt reports belching this morning more than usual.

## 2014-09-27 NOTE — Consult Note (Signed)
Patient ID: MACIL CRADY MRN: [id number], DOB/AGE: 79-May-1920   Admit date: 09/27/2014   Primary Physician: Estill Dooms, MD Primary Cardiologist: Dr. Mare Ferrari  Pt. Profile:  81 y/o female who is DNI/DNR with h/o chronic systolic HF (EF 25-95%) and moderate to severe MR (elected to treat medically given age), who presents to the ED with complaint of atypical chest pain.   Problem List  Past Medical History  Diagnosis Date  . Mitral regurgitation   . Hypertension   . Palpitations   . Nocturnal dyspnea   . Advanced age   . Chronic diastolic CHF (congestive heart failure)     Last echo in 5/11  . Pain in joint, lower leg 02/25/2012  . Pain in joint, lower leg 02/23/2012  . Encounter for long-term (current) use of other medications 10/02/2011  . Cough 07/17/2011  . Internal hemorrhoids without mention of complication [id number]/5643  . Unspecified constipation 01/01/2011  . Rash and other nonspecific skin eruption 08/14/2010  . Tension headache 07/31/2010  . Benign paroxysmal positional vertigo 07/24/2010  . Cervicalgia 07/24/2010  . Urinary tract infection, site not specified 07/10/2010  . Abnormality of gait 05/09/2009  . Vitamin D deficiency 01/25/2009  . Lumbago 01/05/2008  . Cervicitis and endocervicitis 12/22/2007  . Actinic keratosis 12/22/2007  . Pain in limb 12/22/2007  . Muscle weakness (generalized) 08/04/2007  . Blepharochalasis 05/12/2007  . Insomnia, unspecified 05/12/2007  . Dermatophytosis of foot 07/14/2006  . Dizziness and giddiness 06/25/2006  . Allergic rhinitis due to pollen 04/21/2006  . Dyskinesia of esophagus 04/12/2006  . Other malaise and fatigue 04/01/2006  . Unspecified urinary incontinence 02/10/2006  . Cramp of limb 10/07/2005  . Restless legs syndrome (RLS) 06/30/2005  . CKD (chronic kidney disease), stage III   . Hypothyroidism   . Diaphragmatic hernia without mention of obstruction or gangrene 10/03/2003  . Unspecified venous (peripheral) insufficiency  01/05/1998  . Reflux esophagitis 01/06/1996  . Peptic ulcer, unspecified site, unspecified as acute or chronic, without mention of hemorrhage, perforation, or obstruction 01/06/1996  . Diverticulosis of colon (without mention of hemorrhage) 01/06/1996  . Pain in right knee 08/08/2012  . Macular degeneration of right eye 1980's  . Macular degeneration 09/21/2013  . Varicose vein of leg 02/08/2014  . Mitral valve regurgitation     Moderate-severe by echo 05/2014  . Systolic and diastolic CHF, chronic     Past Surgical History  Procedure Laterality Date  . Tonsillectomy and adenoidectomy  1940  . Appendectomy  1940's  . Oophorectomy  1940's  . Breast surgery  1950's    benign tumors removed  . Cataract extraction w/ intraocular lens  implant, bilateral  1982  . Lesion excision  04/2002    melanomas of scalp  Dr. Link Snuffer  . Skin cancer excision  2005    BCC of right temple  . Cholecystectomy, laparoscopic  10/2004    Dr. Deon Pilling  . Tooth extraction  06/2008    and bone implant  Dr. Matilde Haymaker  . Colonoscopy  2000     Allergies  Allergies  Allergen Reactions  . Protonix [Pantoprazole Sodium] Rash  . Ace Inhibitors Cough  . Amoxicillin Diarrhea  . Biaxin [Clarithromycin]     Noted on info sheet from independent living  . Codeine   . Darvon   . Ibuprofen Other (See Comments)    Noted on info sheet from independent living  . Mercurochrome [Merbromin (Mercurochrome)]   . Pantoprazole     Dizzy  .  Prilosec [Omeprazole] Other (See Comments)    Noted on info sheet from independent living  . Septra [Sulfamethoxazole-Trimethoprim]   . Sulfa Drugs Cross Reactors   . Tramadol     Abnormal kidney function  . Vesicare [Solifenacin]     Drys out nose and throat     HPI  81 y/o female, followed by Dr. Mare Ferrari, who presents to the [facility] ED today with a complaint of chest pain. She has no documented  history of CAD. She has a past history of palpitations and supraventricular tachycardia. She  also has a history of mitral regurgitation. She was admitted 05/24/14- 05/27/14 for acute CHF exacerbation. She had an echocardiogram at that time which showed moderate left ventricular systolic dysfunction with an ejection fraction of 40-45% and an impaired relaxation. Her EF was reduced compared to prior study in 2011, when EF was 55-60%. She was also noted to have moderate to severe MR, noted on report to be likely ischemic with restricted posterior leaflet motion. The left atrium was moderately dilated. Given her advanced age and fragility, it was decided to treat conservatively. She did not undergo a TEE and no ischemic w/u was pursued. Her acute CHF was treated with diuretics. Code Status was also discussed at that time and she was clear that her wishes were for DNI/DNR. Her last OV with Dr. Mare Ferrari was 08/01/14 she was felt to be stable from a cardiac standpoint and was instructed to f/u in 4 months for repeat evaluation.  Additional medical problems include a h/o HTN, stage III CKD with baseline SCr of 1.4 and hypothyroidism, on Synthroid.  Today in the ED, she reports the development of left-sided chest/breast pain radiating to her left shoulder. She first noticed symptoms this morning. Her pain is reproducible with palpation and also exacerbated by deep breathing. She denies any associated dyspnea, diaphoresis, nausea or vomiting. She reports full medication compliance. She denies any recent falls, trauma or heavy lifting. However, she does recall that she started wearing a new bra the last several days, that has been uncomfortable. She notes that the fit is too tight. She believes this may be contributing to her discomfort. She also denies palpitations, orthopnea, PND and lower extremity edema.  Stat Troponin is negative. BNP is 584.2 (stable from 4 months ago when level was 587.9). CXR shows cardiomegaly without evidence of acute cardiopulmonary disease. No pulmonary edema. EKG shows NSR w/o acute  abnormalities. Additional labs are notable for mild anemia with Hgb of 11.8. Scr is at baseline at 1.37. BP and HR are both stable. Physical exam is notable for exquisite point tenderness along the lateral aspect of the left chest wall and left posterior shoulder.    Home Medications  Prior to Admission medications   Medication Sig Start Date End Date Taking? Authorizing Provider  acetaminophen (TYLENOL) 500 MG tablet Take 500 mg by mouth 3 (three) times daily.    Yes Historical Provider, MD  acetaminophen (TYLENOL) 650 MG CR tablet Take 650 mg by mouth every 4 (four) hours as needed for pain.   Yes Historical Provider, MD  alum & mag hydroxide-simeth (MAALOX PLUS) 400-400-40 MG/5ML suspension Take 30 mLs by mouth 4 (four) times daily as needed (heartburn).   Yes Historical Provider, MD  aspirin EC 81 MG tablet Take 81 mg by mouth daily.   Yes Historical Provider, MD  calcium-vitamin D (OSCAL WITH D) 500-200 MG-UNIT per tablet Take 1 tablet by mouth daily with breakfast.   Yes Historical Provider, MD  cetirizine (ZYRTEC) 10 MG tablet Take 5 mg by mouth daily. Takes 1/2 tablet each day.   Yes Historical Provider, MD  Cholecalciferol (VITAMIN D3) 1000 UNITS CAPS Take by mouth daily.   Yes Historical Provider, MD  furosemide (LASIX) 40 MG tablet Take 1 tablet daily.  If you gain > 3 lbs in 24 hours or 5 lbs in 1 week, take 1 extra dose. Patient taking differently: Take 40 mg by mouth daily.  05/27/14  Yes Venetia Maxon Rama, MD  levothyroxine (SYNTHROID, LEVOTHROID) 88 MCG tablet Take 1 tablet (88 mcg total) by mouth daily before breakfast. 05/27/14  Yes Christina P Rama, MD  losartan (COZAAR) 25 MG tablet Take 25 mg by mouth daily.   Yes Historical Provider, MD  metoprolol tartrate (LOPRESSOR) 25 MG tablet TAKE (1/2) TABLET DAILY. 06/26/14  Yes Darlin Coco, MD  multivitamin [facility]) per tablet Take 1 tablet by mouth daily.     Yes Historical Provider, MD  potassium chloride SA (K-DUR,KLOR-CON)  20 MEQ tablet Take 1 tablet (20 mEq total) by mouth daily. 05/27/14  Yes Venetia Maxon Rama, MD  aspirin 81 MG tablet Take 81 mg by mouth daily.      Historical Provider, MD  benzonatate (TESSALON) 100 MG capsule Take 1 capsule (100 mg total) by mouth 3 (three) times daily as needed for cough. Patient not taking: Reported on 09/27/2014 05/27/14   Venetia Maxon Rama, MD  Calcium & Magnesium Carbonates (MYLANTA PO) Take 30 mLs by mouth as needed (indigestion).     Historical Provider, MD  diazepam (VALIUM) 2 MG tablet Take 1 tablet (2 mg total) by mouth every 6 (six) hours as needed for anxiety or muscle spasms. Patient not taking: Reported on 09/27/2014 05/27/14   Venetia Maxon Rama, MD  fluticasone [facility]) 50 MCG/ACT nasal spray Place into both nostrils daily.    Historical Provider, MD  guaiFENesin (MUCINEX) 600 MG 12 hr tablet Take 600 mg by mouth as needed for cough.    Historical Provider, MD  meclizine (ANTIVERT) 25 MG tablet Take 25 mg by mouth as needed for dizziness.     Historical Provider, MD  nystatin cream (MYCOSTATIN) Apply topically 2 (two) times daily. Patient not taking: Reported on 09/27/2014 05/27/14   Venetia Maxon Rama, MD  PREMARIN vaginal cream Place 1 Applicatorful vaginally once a week.  07/13/13   Historical Provider, MD    Family History  Family History  Problem Relation Age of Onset  . Stroke Mother   . Heart disease Father   . Diabetes Father   . Heart disease Brother     MI  . Alcohol abuse Sister     Social History  Social History   Social History  . Marital Status: Widowed    Spouse Name: N/A  . Number of Children: N/A  . Years of Education: N/A   Occupational History  . retired Sales promotion account executive    Social History Main Topics  . Smoking status: Never Smoker   . Smokeless tobacco: Never Used  . Alcohol Use: No  . Drug Use: No  . Sexual Activity: No   Other Topics Concern  . Not on file   Social History Narrative   Lives at [facility] since 2008    Widowed 2006   Walks with walker           Review of Systems General:  No chills, fever, night sweats or weight changes.  Cardiovascular:  No chest pain, dyspnea on exertion, edema,  orthopnea, palpitations, paroxysmal nocturnal dyspnea. Dermatological: No rash, lesions/masses Respiratory: No cough, dyspnea Urologic: No hematuria, dysuria Abdominal:   No nausea, vomiting, diarrhea, bright red blood per rectum, melena, or hematemesis Neurologic:  No visual changes, wkns, changes in mental status. All other systems reviewed and are otherwise negative except as noted above.  Physical Exam  Blood pressure 116/51, pulse 67, temperature 97.8 F (36.6 C), temperature source Oral, resp. rate 20, SpO2 98 %.  General: Pleasant, NAD Psych: Normal affect. Neuro: Alert and oriented X 3. Moves all extremities spontaneously. HEENT: Normal  Neck: Supple without bruits or JVD. Lungs:  Resp regular and unlabored, CTA. Chest Wall: + tenderness with palpation of the lateral aspect of the left chest wall Heart: RRR 1/6 apical systolic murmur Abdomen: Soft, non-tender, non-distended, BS + x 4.  Extremities: No clubbing, cyanosis or edema. DP/PT/Radials 2+ and equal bilaterally.  Labs  Troponin [facility] of Care Test)  Recent Labs  09/27/14 1033  TROPIPOC 0.02   No results for input(s): CKTOTAL, CKMB, TROPONINI in the last 72 hours. Lab Results  Component Value Date   WBC 9.6 09/27/2014   HGB 11.8* 09/27/2014   HCT 36.7 09/27/2014   MCV 98.4 09/27/2014   PLT 300 09/27/2014     Recent Labs Lab 09/27/14 1028  NA 141  K 4.0  CL 107  CO2 26  BUN 46*  CREATININE 1.37*  CALCIUM 9.4  GLUCOSE 113*   Lab Results  Component Value Date   CHOL 174 06/07/2012   HDL 65 06/07/2012   LDLCALC 89 06/07/2012   TRIG 98 06/07/2012   No results found for: DDIMER   Radiology/Studies  Dg Chest 2 View  09/27/2014   CLINICAL DATA:  Acute chest pain today.  EXAM: CHEST  2 VIEW  COMPARISON:   05/24/2014 and prior chest radiographs  FINDINGS: Cardiomegaly again noted.  There is no evidence of focal airspace disease, pulmonary edema, suspicious pulmonary nodule/mass, pleural effusion, or pneumothorax. No acute bony abnormalities are identified.  Severe degenerative changes in the shoulders again noted.  IMPRESSION: Cardiomegaly without evidence of acute cardiopulmonary disease.   Electronically Signed   By: Margarette Canada M.D.   On: 09/27/2014 11:22    ECG  NSR. Non acute.    ASSESSMENT AND PLAN  Principal Problem:   Musculoskeletal chest pain Active Problems:   Hypothyroidism   Chronic kidney disease, stage III (moderate)   Mitral valve regurgitation  1. Atypical Chest Pain: The patient's presentation is consistent with musculoskeletal chest pain. Symptoms are pleuritic in nature and physical exam is notable for exquisite point tenderness/reproducible pain along the lateral aspect of the left chest wall and left posterior shoulder. Her EKG, troponin and chest x-ray are all unremarkable. Would recommend PRN Tylenol for pain. Can also try a 1x dose of IV Toradol.  However, would avoid longterm use as NSAIDs as it was outlined in Dr. Sherryl Barters office note from 08/01/2014 that her PCP had stop tramadol, which was use for arthritis, due to worsening renal function. Her symptoms are not concerning for ischemia, thus no further cardiac w/u indicated.   2. Mitral valve regurgitation: Moderate to severe on most recent echocardiogram in May 2016. Given her advanced age and fragility, it was decided at that time to treat conservatively. She does not appear to be in acute heart failure at this time and she denies any recent symptoms of dyspnea, orthopnea or PND. Continue conservative treatment..  3. Chronic systolic CHF: She appears fairly euvolemic on  physical exam and chest x-ray is without signs of pulmonary edema. Continue daily Lasix, BB and ARB for systolic dysfunction.  4. HTN: BP is well  controlled.  6. Hypothyroidism: on synthroid. Followed by PCP.   Dispo: No indication for admission. Can discharge back to assisted living facility at [facility]. Continue routine follow-up with Dr. Mare Ferrari as directed.  Signed, Lyda Jester, PA-C 09/27/2014, 1:32 PM  I have examined the patient and reviewed assessment and plan and discussed with patient.  Agree with above as stated.  Her discomfort does not appear to be cardiac. It is very pleuritic. There is pain to palpation of her left chest. There is pain with inspiration. There is no exertional component to her pain.  No signs of heart failure. Would not pursue any ischemic workup at this time. Would not likely pursue invasive testing. ECG and enzymes are unremarkable. Will defer management of her pain to the ER physician. She also reports some constipation as well. I discussed this with Dr. Carolyn Stare. Please call if there are any further questions.  VARANASI,JAYADEEP S.

## 2014-09-27 NOTE — Discharge Instructions (Signed)

## 2014-09-27 NOTE — ED Provider Notes (Signed)
CSN: [id number]     Arrival date & time 09/27/14  1002 History   First MD Initiated Contact with Patient 09/27/14 1012     Chief Complaint  Patient presents with  . Chest Pain     (Consider location/radiation/quality/duration/timing/severity/associated sxs/prior Treatment) HPI   Patient is a very pleasant 81 year old female with past history significant for mitral regards CHF. Recent admission in June for fluid overload.  Patient had recent follow-up appointment in July with her cardiologist. Given her age she's not I can get further for mitral valve replacement. He'll continue to manage her with diuresis.  Patient had chest pain starting this morning when she woke up. She walks to breakfast and it moved from her chest to her left shoulder. She ate her breakfast and then continued to feel short of breath with the chest pain. She walked back to her room and nurses called EMS. EMS gave her aspirin prior to arrival.   She has not noticed any new swelling.  Past Medical History  Diagnosis Date  . Mitral regurgitation   . Hypertension   . Palpitations   . Nocturnal dyspnea   . Advanced age   . Chronic diastolic CHF (congestive heart failure)     Last echo in 5/11  . Pain in joint, lower leg 02/25/2012  . Pain in joint, lower leg 02/23/2012  . Encounter for long-term (current) use of other medications 10/02/2011  . Cough 07/17/2011  . Internal hemorrhoids without mention of complication 95/18/8416  . Unspecified constipation 01/01/2011  . Rash and other nonspecific skin eruption 08/14/2010  . Tension headache 07/31/2010  . Benign paroxysmal positional vertigo 07/24/2010  . Cervicalgia 07/24/2010  . Urinary tract infection, site not specified 07/10/2010  . Abnormality of gait 05/09/2009  . Vitamin D deficiency 01/25/2009  . Lumbago 01/05/2008  . Cervicitis and endocervicitis 12/22/2007  . Actinic keratosis 12/22/2007  . Pain in limb 12/22/2007  . Muscle weakness (generalized) 08/04/2007  .  Blepharochalasis 05/12/2007  . Insomnia, unspecified 05/12/2007  . Dermatophytosis of foot 07/14/2006  . Dizziness and giddiness 06/25/2006  . Allergic rhinitis due to pollen 04/21/2006  . Dyskinesia of esophagus 04/12/2006  . Other malaise and fatigue 04/01/2006  . Unspecified urinary incontinence 02/10/2006  . Cramp of limb 10/07/2005  . Restless legs syndrome (RLS) 06/30/2005  . CKD (chronic kidney disease), stage III   . Hypothyroidism   . Diaphragmatic hernia without mention of obstruction or gangrene 10/03/2003  . Unspecified venous (peripheral) insufficiency 01/05/1998  . Reflux esophagitis 01/06/1996  . Peptic ulcer, unspecified site, unspecified as acute or chronic, without mention of hemorrhage, perforation, or obstruction 01/06/1996  . Diverticulosis of colon (without mention of hemorrhage) 01/06/1996  . Pain in right knee 08/08/2012  . Macular degeneration of right eye 1980's  . Macular degeneration 09/21/2013  . Varicose vein of leg 02/08/2014  . Mitral valve regurgitation     Moderate-severe by echo 05/2014  . Systolic and diastolic CHF, chronic    Past Surgical History  Procedure Laterality Date  . Tonsillectomy and adenoidectomy  1940  . Appendectomy  1940's  . Oophorectomy  1940's  . Breast surgery  1950's    benign tumors removed  . Cataract extraction w/ intraocular lens  implant, bilateral  1982  . Lesion excision  04/2002    melanomas of scalp  Dr. Link Snuffer  . Skin cancer excision  2005    BCC of right temple  . Cholecystectomy, laparoscopic  10/2004    Dr. Deon Pilling  .  Tooth extraction  06/2008    and bone implant  Dr. Matilde Haymaker  . Colonoscopy  2000   Family History  Problem Relation Age of Onset  . Stroke Mother   . Heart disease Father   . Diabetes Father   . Heart disease Brother     MI  . Alcohol abuse Sister    Social History  Substance Use Topics  . Smoking status: Never Smoker   . Smokeless tobacco: Never Used  . Alcohol Use: No   OB History    No data  available     Review of Systems  Constitutional: Negative for activity change and fatigue.  HENT: Negative for congestion and drooling.   Eyes: Negative for discharge.  Respiratory: Positive for chest tightness and shortness of breath. Negative for cough.   Cardiovascular: Positive for chest pain.  Gastrointestinal: Negative for abdominal distention.  Genitourinary: Negative for dysuria and difficulty urinating.  Musculoskeletal: Negative for joint swelling.  Skin: Negative for rash.  Allergic/Immunologic: Negative for immunocompromised state.  Neurological: Negative for seizures and speech difficulty.  Psychiatric/Behavioral: Negative for behavioral problems and agitation.      Allergies  Protonix; Ace inhibitors; Amoxicillin; Biaxin; Codeine; Darvon; Ibuprofen; Mercurochrome; Pantoprazole; Prilosec; Septra; Sulfa drugs cross reactors; Tramadol; and Vesicare  Home Medications   Prior to Admission medications   Medication Sig Start Date End Date Taking? Authorizing Provider  acetaminophen (TYLENOL) 500 MG tablet Take 500 mg by mouth as needed for pain (takes 2 per day or prn).    Historical Provider, MD  aspirin 81 MG tablet Take 81 mg by mouth daily.      Historical Provider, MD  benzonatate (TESSALON) 100 MG capsule Take 1 capsule (100 mg total) by mouth 3 (three) times daily as needed for cough. 05/27/14   Venetia Maxon Rama, MD  Calcium & Magnesium Carbonates (MYLANTA PO) Take 30 mLs by mouth as needed (indigestion).     Historical Provider, MD  calcium-vitamin D (OSCAL WITH D) 500-200 MG-UNIT per tablet Take 1 tablet by mouth daily with breakfast.    Historical Provider, MD  cetirizine (ZYRTEC) 10 MG tablet Take 5 mg by mouth daily. Takes 1/2 tablet each day.    Historical Provider, MD  Cholecalciferol (VITAMIN D3) 1000 UNITS CAPS Take by mouth daily.    Historical Provider, MD  diazepam (VALIUM) 2 MG tablet Take 1 tablet (2 mg total) by mouth every 6 (six) hours as needed for  anxiety or muscle spasms. 05/27/14   Venetia Maxon Rama, MD  fluticasone (FLONASE) 50 MCG/ACT nasal spray Place into both nostrils daily.    Historical Provider, MD  furosemide (LASIX) 40 MG tablet Take 1 tablet daily.  If you gain > 3 lbs in 24 hours or 5 lbs in 1 week, take 1 extra dose. 05/27/14   Venetia Maxon Rama, MD  guaiFENesin (MUCINEX) 600 MG 12 hr tablet Take 600 mg by mouth as needed for cough.    Historical Provider, MD  levothyroxine (SYNTHROID, LEVOTHROID) 88 MCG tablet Take 1 tablet (88 mcg total) by mouth daily before breakfast. 05/27/14   Venetia Maxon Rama, MD  losartan (COZAAR) 25 MG tablet Take 25 mg by mouth daily.    Historical Provider, MD  meclizine (ANTIVERT) 25 MG tablet Take 25 mg by mouth as needed for dizziness.     Historical Provider, MD  metoprolol tartrate (LOPRESSOR) 25 MG tablet TAKE (1/2) TABLET DAILY. 06/26/14   Darlin Coco, MD  multivitamin [facility]) per tablet Take 1  tablet by mouth daily.      Historical Provider, MD  nystatin cream (MYCOSTATIN) Apply topically 2 (two) times daily. 05/27/14   Venetia Maxon Rama, MD  potassium chloride SA (K-DUR,KLOR-CON) 20 MEQ tablet Take 1 tablet (20 mEq total) by mouth daily. 05/27/14   Venetia Maxon Rama, MD  PREMARIN vaginal cream Place 1 Applicatorful vaginally once a week.  07/13/13   Historical Provider, MD   BP 136/43 mmHg  Pulse 78  Temp(Src) 97.8 F (36.6 C) (Oral)  Resp 21  SpO2 100% Physical Exam  Constitutional: She is oriented to person, place, and time. She appears well-developed and well-nourished.  HENT:  Head: Normocephalic and atraumatic.  Eyes: Conjunctivae are normal. Right eye exhibits no discharge.  Neck: Neck supple.  Cardiovascular: Normal rate and regular rhythm.   Murmur heard. Pulmonary/Chest: Effort normal and breath sounds normal. She has no wheezes. She has no rales.  Abdominal: Soft. She exhibits no distension. There is no tenderness.  Musculoskeletal: Normal range of motion.  Trace edema  bilateral lower extremities  Neurological: She is oriented to person, place, and time. No cranial nerve deficit.  Skin: Skin is warm and dry. No rash noted. She is not diaphoretic.  Psychiatric: She has a normal mood and affect. Her behavior is normal.  Nursing note and vitals reviewed.   ED Course  Procedures (including critical care time) Labs Review Labs Reviewed  BASIC METABOLIC PANEL  CBC  BRAIN NATRIURETIC PEPTIDE  I-STAT [place], ED    Imaging Review No results found. I have personally reviewed and evaluated these images and lab results as part of my medical decision-making.   EKG Interpretation   Date/Time:  Thursday September 27 2014 10:12:17 EDT Ventricular Rate:  74 PR Interval:  193 QRS Duration: 113 QT Interval:  451 QTC Calculation: 500 R Axis:   23 Text Interpretation:  Sinus rhythm Probable left atrial enlargement  Borderline intraventricular conduction delay Prolonged QT interval No  significant change since last tracing no acute ischemia. Confirmed by  Gerald Leitz ([id number]) on 09/27/2014 10:19:31 AM      MDM   Final diagnoses:  None   patient is an adorable 81 year old female presenting with chest pain. Patient had chest pain starting this morning traveled into her left shoulder. Concerning for ischemia. Will get initial troponin and chest xray.  HEART score is 6.   Patient will likely require admission given her  history and concerning story.   3:13 PM Patient was seen by cardiologist. I spoke with him and he said that does not have cardiac and we'll have her discharged back home. Patient feels comfortable with  this plan. She is talking about the pain in her left breast and asking for mammography. We will have her follow-up with her primary care physician to facilitate this. Patient also claims of constipation we will have her take Colace as needed.  Courteney Julio Alm, MD 09/27/14 1515

## 2014-09-27 NOTE — ED Notes (Signed)
Pt voices concern about pain and sts "I just know something is wrong."  RN calmed pt and MD made aware of pt concerns.  Pt on phone with daughter at this time.

## 2014-09-28 ENCOUNTER — Encounter: Payer: Self-pay | Admitting: Nurse Practitioner

## 2014-09-28 ENCOUNTER — Encounter: Payer: Self-pay | Admitting: Internal Medicine

## 2014-09-28 ENCOUNTER — Encounter: Payer: Self-pay | Admitting: Cardiology

## 2014-10-18 ENCOUNTER — Non-Acute Institutional Stay: Payer: Medicare Other | Admitting: Internal Medicine

## 2014-10-18 ENCOUNTER — Encounter: Payer: Self-pay | Admitting: Internal Medicine

## 2014-10-18 VITALS — BP 124/70 | HR 64 | Temp 97.3°F | Wt 164.0 lb

## 2014-10-18 DIAGNOSIS — I10 Essential (primary) hypertension: Secondary | ICD-10-CM | POA: Diagnosis not present

## 2014-10-18 DIAGNOSIS — H3530 Unspecified macular degeneration: Secondary | ICD-10-CM

## 2014-10-18 DIAGNOSIS — N183 Chronic kidney disease, stage 3 unspecified: Secondary | ICD-10-CM

## 2014-10-18 DIAGNOSIS — E039 Hypothyroidism, unspecified: Secondary | ICD-10-CM

## 2014-10-18 DIAGNOSIS — M25561 Pain in right knee: Secondary | ICD-10-CM

## 2014-10-18 DIAGNOSIS — I5042 Chronic combined systolic (congestive) and diastolic (congestive) heart failure: Secondary | ICD-10-CM

## 2014-10-18 DIAGNOSIS — N952 Postmenopausal atrophic vaginitis: Secondary | ICD-10-CM

## 2014-10-18 MED ORDER — DICLOFENAC SODIUM 1 % TD GEL: TRANSDERMAL | Status: DC

## 2014-10-18 MED ORDER — TORSEMIDE 20 MG PO TABS: ORAL_TABLET | ORAL | Status: DC

## 2014-10-18 NOTE — Progress Notes (Signed)
Patient ID: Ann [name], female   DOB: 06/28/1918, 81 y.o.   MRN: [id number]    [phone]  Place of Service: Clinic (12)     Allergies  Allergen Reactions  . Protonix [Pantoprazole Sodium] Rash  . Ace Inhibitors Cough  . Amoxicillin Diarrhea  . Biaxin [Clarithromycin]     Noted on info sheet from independent living  . Codeine   . Darvon   . Ibuprofen Other (See Comments)    Noted on info sheet from independent living  . Mercurochrome [Merbromin (Mercurochrome)]   . Pantoprazole     Dizzy  . Prilosec [Omeprazole] Other (See Comments)    Noted on info sheet from independent living  . Septra [Sulfamethoxazole-Trimethoprim]   . Sulfa Drugs Cross Reactors   . Tramadol     Abnormal kidney function  . Vesicare [Solifenacin]     Drys out nose and throat     Chief Complaint  Patient presents with  . Medical Management of Chronic Issues    CHF, CKD, blood pressure, thyroid  . legs itching    using OTC creams, been itching since Lasix was douple    HPI:  Patient was seen at the emergency room 09/28/2014 for a "severe" pain in the left breast. After evaluation, she was returned to [facility] residential care building. Patient suspects that she had some sort of spasm or reaction to a flu injection that she had had a couple of days prior to the onset of the discomfort. She denies any palpitations or severe dyspnea at the time of the pain. Pain has completely resolved now.  Patient has had an increase in her BUN and creatinine following the increase of her Lasix 40 mg daily. She also has had an increased BNP in the past, worrisome for deterioration of her congestive heart failure.  Edema has improved some with the increase in furosemide, but she has broken out in a rash on the distal legs. She believes this is due to the increase in the furosemide dosing.  She has chronic knee pains. Daughter asked if alternative gel to  be used in the knees. Patient says these are not as uncomfortable today as they were recently.  Dr. McCuen, ophthalmologist, has seen patient ion 10/08/14. She reaffirms a diagnosis of macular degeneration. Because of some dryness to the eyes, she recommended artificial tears.  Medications: Patient's Medications  New Prescriptions   No medications on file  Previous Medications   ACETAMINOPHEN (TYLENOL) 500 MG TABLET    Take 500 mg by mouth 3 (three) times daily.    ACETAMINOPHEN (TYLENOL) 650 MG CR TABLET    Take 650 mg by mouth every 4 (four) hours as needed for pain.   ALUM & MAG HYDROXIDE-SIMETH (MAALOX PLUS) 400-400-40 MG/5ML SUSPENSION    Take 30 mLs by mouth 4 (four) times daily as needed (heartburn).   ASPIRIN 81 MG TABLET    Take 81 mg by mouth daily.     ASPIRIN EC 81 MG TABLET    Take 81 mg by mouth daily.   BENZONATATE (TESSALON) 100 MG CAPSULE    Take 1 capsule (100 mg total) by mouth 3 (three) times daily as needed for cough.   CALCIUM & MAGNESIUM CARBONATES (MYLANTA PO)    Take 30 mLs by mouth as needed (indigestion).    CALCIUM-VITAMIN D (OSCAL WITH D) 500-200 MG-UNIT PER TABLET    Take 1 tablet by mouth daily with breakfast.     CETIRIZINE (ZYRTEC) 10 MG TABLET    Take 5 mg by mouth daily. Takes 1/2 tablet each day.   CHOLECALCIFEROL (VITAMIN D3) 1000 UNITS CAPS    Take by mouth daily.   DIAZEPAM (VALIUM) 2 MG TABLET    Take 1 tablet (2 mg total) by mouth every 6 (six) hours as needed for anxiety or muscle spasms.   DOCUSATE SODIUM (COLACE) 100 MG CAPSULE    Take 1 capsule (100 mg total) by mouth every 12 (twelve) hours.   FLUTICASONE (FLONASE) 50 MCG/ACT NASAL SPRAY    Place into both nostrils daily.   FUROSEMIDE (LASIX) 40 MG TABLET    Take 1 tablet daily.  If you gain > 3 lbs in 24 hours or 5 lbs in 1 week, take 1 extra dose.   GUAIFENESIN (MUCINEX) 600 MG 12 HR TABLET    Take 600 mg by mouth as needed for cough.   LEVOTHYROXINE (SYNTHROID, LEVOTHROID) 88 MCG TABLET    Take 1  tablet (88 mcg total) by mouth daily before breakfast.   LOSARTAN (COZAAR) 25 MG TABLET    Take 25 mg by mouth daily.   MECLIZINE (ANTIVERT) 25 MG TABLET    Take 25 mg by mouth as needed for dizziness.    METOPROLOL TARTRATE (LOPRESSOR) 25 MG TABLET    TAKE (1/2) TABLET DAILY.   MULTIVITAMIN (THERAGRAN) PER TABLET    Take 1 tablet by mouth daily.     NYSTATIN CREAM (MYCOSTATIN)    Apply topically 2 (two) times daily.   POTASSIUM CHLORIDE SA (K-DUR,KLOR-CON) 20 MEQ TABLET    Take 1 tablet (20 mEq total) by mouth daily.   PREMARIN VAGINAL CREAM    Place 1 Applicatorful vaginally once a week.   Modified Medications   No medications on file  Discontinued Medications   No medications on file     Review of Systems  Constitutional: Negative.   HENT: Positive for hearing loss.   Eyes: Negative.   Respiratory: Positive for shortness of breath.   Cardiovascular: Negative.        Hospitalized May 2016 for acute systolic and diastolic congestive heart failure.  Gastrointestinal: Negative.   Genitourinary: Negative for vaginal pain.       Stress incontinence.  Musculoskeletal: Positive for gait problem.       Chronic knee pains. Uses a walker.  Skin:       Improved perirectal and labial burning  Allergic/Immunologic: Negative.   Neurological: Negative.   Hematological: Negative.   Psychiatric/Behavioral:       Depressive symptoms come and go.    Filed Vitals:   10/18/14 1424  BP: 124/70  Pulse: 64  Temp: 97.3 F (36.3 C)  TempSrc: Oral  Weight: 164 lb (74.39 kg)  SpO2: 97%   Body mass index is 29.06 kg/(m^2).  Physical Exam  Constitutional: She is oriented to person, place, and time. She appears well-developed and well-nourished. No distress.  HENT:  Head: Normocephalic and atraumatic.  Right Ear: External ear normal.  Left Ear: External ear normal.  Nose: Nose normal.  Mouth/Throat: Oropharynx is clear and moist.  Loss of hearing bilaterally.  Eyes: Conjunctivae and EOM  are normal. Pupils are equal, round, and reactive to light.  Corrective lenses and prisms on her glasses.  Neck: No JVD present. No tracheal deviation present. No thyromegaly present.  Cardiovascular:  Regular rhythm. Grade 3/6 systolic ejection murmur at left sternal border. No heaves or thrills on palpation. Dorsalis pedis and posterior tibial arteries   have diminished pulses bilaterally. Varicose leg veins bilaterally.  Abdominal: She exhibits no distension and no mass. There is no tenderness.  Genitourinary:   labial atrophy  Musculoskeletal: She exhibits no edema.  Exquisitely tender in the medial side of the right knee. Crepitance in both knees. She is able to use her walker and a cane and walk unassisted.  Lymphadenopathy:    She has no cervical adenopathy.  Neurological: She is alert and oriented to person, place, and time. No cranial nerve deficit. Coordination normal.  Reduced vibratory sensation in the right leg and foot.  Skin: No rash noted. No erythema. No pallor.  Psychiatric: She has a normal mood and affect. Her behavior is normal. Judgment and thought content normal.     Labs reviewed: Lab Summary Latest Ref Rng 09/27/2014 07/13/2014 06/19/2014 06/07/2014 05/29/2014  Hemoglobin 12.0 - 15.0 g/dL 11.8(L) 11.1(A) (None) (None) (None)  Hematocrit 36.0 - 46.0 % 36.7 33(A) (None) (None) (None)  White count 4.0 - 10.5 K/uL 9.6 9.5 (None) (None) (None)  Platelet count 150 - 400 K/uL 300 299 (None) (None) (None)  Sodium 135 - 145 mmol/L 141 134(A) 141 139 134(L)  Potassium 3.5 - 5.1 mmol/L 4.0 4.3 4.3 4.2 4.6  Calcium 8.9 - 10.3 mg/dL 9.4 (None) (None) (None) 9.1  Phosphorus - (None) (None) (None) (None) (None)  Creatinine 0.44 - 1.00 mg/dL 1.37(H) 1.4(A) 1.3(A) 1.4(A) 1.86(H)  AST 13 - 35 U/L (None) 21 28 (None) (None)  Alk Phos 25 - 125 U/L (None) 118 90 (None) (None)  Bilirubin - (None) (None) (None) (None) (None)  Glucose 65 - 99 mg/dL 113(H) (None) 91 87 87  Cholesterol -  (None) (None) (None) (None) (None)  HDL cholesterol - (None) (None) (None) (None) (None)  Triglycerides - (None) (None) (None) (None) (None)  LDL Direct - (None) (None) (None) (None) (None)  LDL Calc - (None) (None) (None) (None) (None)  Total protein - (None) (None) (None) (None) (None)  Albumin - (None) (None) (None) (None) (None)   Lab Results  Component Value Date   TSH 3.30 07/13/2014   Lab Results  Component Value Date   BUN 46* 09/27/2014   Lab Results  Component Value Date   HGBA1C 5.4 07/13/2014   Assessment/Plan  1. Essential hypertension Controlled  2. Hypothyroidism, unspecified hypothyroidism type Compensated  3. Atrophic vaginitis Does better with regular applications of conjugated estrogen cream Recommended weekly application.  4. Chronic kidney disease, stage III (moderate) Continue to monitor BUN and creatinine  5. Right knee pain Voltaren gel 4 times daily  6. Systolic and diastolic CHF, chronic (HCC) Repeat BNP  7. Macular degeneration Worsening    

## 2014-10-23 LAB — HEPATIC FUNCTION PANEL
ALK PHOS: 70 U/L (ref 25–125)
ALT: 10 U/L (ref 7–35)
AST: 19 U/L (ref 13–35)
Bilirubin, Total: 0.4 mg/dL

## 2014-10-23 LAB — BASIC METABOLIC PANEL
BUN: 63 mg/dL — AB (ref 4–21)
CREATININE: 1.5 mg/dL — AB (ref 0.5–1.1)
Glucose: 78 mg/dL
Potassium: 4.2 mmol/L (ref 3.4–5.3)
Sodium: 138 mmol/L (ref 137–147)

## 2014-10-23 LAB — TSH: TSH: 6.75 u[IU]/mL — AB (ref 0.41–5.90)

## 2014-10-24 ENCOUNTER — Non-Acute Institutional Stay: Payer: Medicare Other | Admitting: Nurse Practitioner

## 2014-10-24 ENCOUNTER — Encounter: Payer: Self-pay | Admitting: Nurse Practitioner

## 2014-10-24 DIAGNOSIS — I5042 Chronic combined systolic (congestive) and diastolic (congestive) heart failure: Secondary | ICD-10-CM | POA: Diagnosis not present

## 2014-10-24 DIAGNOSIS — M25561 Pain in right knee: Secondary | ICD-10-CM

## 2014-10-24 DIAGNOSIS — K5900 Constipation, unspecified: Secondary | ICD-10-CM | POA: Diagnosis not present

## 2014-10-24 DIAGNOSIS — I10 Essential (primary) hypertension: Secondary | ICD-10-CM

## 2014-10-24 DIAGNOSIS — F411 Generalized anxiety disorder: Secondary | ICD-10-CM

## 2014-10-24 DIAGNOSIS — N183 Chronic kidney disease, stage 3 unspecified: Secondary | ICD-10-CM

## 2014-10-24 DIAGNOSIS — E039 Hypothyroidism, unspecified: Secondary | ICD-10-CM | POA: Diagnosis not present

## 2014-10-24 NOTE — Assessment & Plan Note (Signed)
BNP 363.7 10/23/14, Torsemide 20mg  since 10/18/14, continue to observe for s/s of CHF decompensation.

## 2014-10-24 NOTE — Assessment & Plan Note (Signed)
Controlled, continue Torsemide 20mg , Losartain 25mg , Metoprolol 12.5mg  daily.

## 2014-10-24 NOTE — Progress Notes (Signed)
Patient ID: Ann [name], female   DOB: [birth year], 81 y.o.   MRN: [id number]  Location:  [facility] Provider:  Marlana Latus NP  Code Status:  DNR Goals of care: Advanced Directive information    Chief Complaint  Patient presents with  . Medical Management of Chronic Issues  . Acute Visit    hypothyroidism     HPI: Patient is a 81 y.o. female seen in the AL at [facility] today for evaluation of  Elevated TSH 6.749 10/23/14, taking Levothyroxine 43mcg daily presently. Chronic CHF, off Furosemide and started Torsemide 20mg  10/18/14, BNP 364 10/23/14.   Review of Systems  Constitutional: Negative.   HENT: Positive for hearing loss.   Eyes: Negative.   Respiratory: Positive for shortness of breath.   Cardiovascular: Negative.        Hospitalized May 7672 for acute systolic and diastolic congestive heart failure.  Gastrointestinal: Negative.   Genitourinary:       Stress incontinence.  Musculoskeletal:       Chronic knee pains. Uses a walker.  Skin:       Improved perirectal and labial burning  Neurological: Negative.   Psychiatric/Behavioral:       Depressive symptoms come and go.    Past Medical History  Diagnosis Date  . Mitral regurgitation   . Hypertension   . Palpitations   . Nocturnal dyspnea   . Advanced age   . Chronic diastolic CHF (congestive heart failure) ([place])     Last echo in 5/11  . Pain in joint, lower leg 02/25/2012  . Pain in joint, lower leg 02/23/2012  . Encounter for long-term (current) use of other medications 10/02/2011  . Cough 07/17/2011  . Internal hemorrhoids without mention of complication 09/47/0962  . Unspecified constipation 01/01/2011  . Rash and other nonspecific skin eruption 08/14/2010  . Tension headache 07/31/2010  . Benign paroxysmal positional vertigo 07/24/2010  . Cervicalgia 07/24/2010  . Urinary tract infection, site not specified 07/10/2010  . Abnormality of gait 05/09/2009  . Vitamin D deficiency 01/25/2009  . Lumbago  01/05/2008  . Cervicitis and endocervicitis 12/22/2007  . Actinic keratosis 12/22/2007  . Pain in limb 12/22/2007  . Muscle weakness (generalized) 08/04/2007  . Blepharochalasis 05/12/2007  . Insomnia, unspecified 05/12/2007  . Dermatophytosis of foot 07/14/2006  . Dizziness and giddiness 06/25/2006  . Allergic rhinitis due to pollen 04/21/2006  . Dyskinesia of esophagus 04/12/2006  . Other malaise and fatigue 04/01/2006  . Unspecified urinary incontinence 02/10/2006  . Cramp of limb 10/07/2005  . Restless legs syndrome (RLS) 06/30/2005  . CKD (chronic kidney disease), stage III   . Hypothyroidism   . Diaphragmatic hernia without mention of obstruction or gangrene 10/03/2003  . Unspecified venous (peripheral) insufficiency 01/05/1998  . Reflux esophagitis 01/06/1996  . Peptic ulcer, unspecified site, unspecified as acute or chronic, without mention of hemorrhage, perforation, or obstruction 01/06/1996  . Diverticulosis of colon (without mention of hemorrhage) 01/06/1996  . Pain in right knee 08/08/2012  . Macular degeneration of right eye 1980's  . Macular degeneration 09/21/2013  . Varicose vein of leg 02/08/2014  . Mitral valve regurgitation     Moderate-severe by echo 05/2014  . Systolic and diastolic CHF, chronic [facility])     Patient Active Problem List   Diagnosis Date Noted  . Constipation 10/24/2014  . Musculoskeletal chest pain 09/27/2014  . CKD (chronic kidney disease), stage II 07/19/2014  . Acute bronchitis 07/16/2014  . Generalized anxiety disorder 07/16/2014  .  Right knee pain 06/14/2014  . Dyspnea 06/14/2014  . Systolic and diastolic CHF, chronic ([place]) 05/27/2014  . Candida infection of genital region 05/26/2014  . Mitral valve regurgitation 05/26/2014  . Bunion of great toe 02/08/2014  . Varicose vein of leg 02/08/2014  . Macular degeneration 09/21/2013  . Atrophic vaginitis 07/13/2013  . Urinary incontinence 09/22/2012  . Hypertension   . Diplopia 09/12/2012  . Chronic kidney disease,  stage III (moderate) 08/08/2012  . PVC's (premature ventricular contractions) 09/08/2011  . Hypothyroidism 04/17/2010  . Spinal stenosis, lumbar 04/17/2010  . Abnormality of gait 05/09/2009  . Reflux esophagitis 01/06/1996  . Peptic ulcer, unspecified site, unspecified as acute or chronic, without mention of hemorrhage, perforation, or obstruction 01/06/1996    Allergies  Allergen Reactions  . Protonix [Pantoprazole Sodium] Rash  . Ace Inhibitors Cough  . Amoxicillin Diarrhea  . Biaxin [Clarithromycin]     Noted on info sheet from independent living  . Codeine   . Darvon   . Ibuprofen Other (See Comments)    Noted on info sheet from independent living  . Mercurochrome [Merbromin (Mercurochrome)]   . Pantoprazole     Dizzy  . Prilosec [Omeprazole] Other (See Comments)    Noted on info sheet from independent living  . Septra [Sulfamethoxazole-Trimethoprim]   . Sulfa Drugs Cross Reactors   . Tramadol     Abnormal kidney function  . Vesicare [Solifenacin]     Drys out nose and throat     Medications: Patient's Medications  New Prescriptions   No medications on file  Previous Medications   ACETAMINOPHEN (TYLENOL) 500 MG TABLET    Take 500 mg by mouth 3 (three) times daily.    ACETAMINOPHEN (TYLENOL) 650 MG CR TABLET    Take 650 mg by mouth every 4 (four) hours as needed for pain.   ALUM & MAG HYDROXIDE-SIMETH (MAALOX PLUS) 400-400-40 MG/5ML SUSPENSION    Take 30 mLs by mouth 4 (four) times daily as needed (heartburn).   ASPIRIN 81 MG TABLET    Take 81 mg by mouth daily.     ASPIRIN EC 81 MG TABLET    Take 81 mg by mouth daily.   BENZONATATE (TESSALON) 100 MG CAPSULE    Take 1 capsule (100 mg total) by mouth 3 (three) times daily as needed for cough.   CALCIUM & MAGNESIUM CARBONATES (MYLANTA PO)    Take 30 mLs by mouth as needed (indigestion).    CALCIUM-VITAMIN D (OSCAL WITH D) 500-200 MG-UNIT PER TABLET    Take 1 tablet by mouth daily with breakfast.   CETIRIZINE (ZYRTEC) 10  MG TABLET    Take 5 mg by mouth daily. Takes 1/2 tablet each day.   CHOLECALCIFEROL (VITAMIN D3) 1000 UNITS CAPS    Take by mouth daily.   DIAZEPAM (VALIUM) 2 MG TABLET    Take 1 tablet (2 mg total) by mouth every 6 (six) hours as needed for anxiety or muscle spasms.   DICLOFENAC SODIUM (VOLTAREN) 1 % GEL    Apply topically 2-4 times daily to painful foot and heel   DOCUSATE SODIUM (COLACE) 100 MG CAPSULE    Take 1 capsule (100 mg total) by mouth every 12 (twelve) hours.   FLUTICASONE (FLONASE) 50 MCG/ACT NASAL SPRAY    Place into both nostrils daily.   GUAIFENESIN (MUCINEX) 600 MG 12 HR TABLET    Take 600 mg by mouth as needed for cough.   LEVOTHYROXINE (SYNTHROID, LEVOTHROID) 88 MCG TABLET  Take 1 tablet (88 mcg total) by mouth daily before breakfast.   LOSARTAN (COZAAR) 25 MG TABLET    Take 25 mg by mouth daily.   MECLIZINE (ANTIVERT) 25 MG TABLET    Take 25 mg by mouth as needed for dizziness.    METOPROLOL TARTRATE (LOPRESSOR) 25 MG TABLET    TAKE (1/2) TABLET DAILY.   MULTIVITAMIN (THERAGRAN) PER TABLET    Take 1 tablet by mouth daily.     NYSTATIN CREAM (MYCOSTATIN)    Apply topically 2 (two) times daily.   POTASSIUM CHLORIDE SA (K-DUR,KLOR-CON) 20 MEQ TABLET    Take 1 tablet (20 mEq total) by mouth daily.   PREMARIN VAGINAL CREAM    Place 1 Applicatorful vaginally once a week.    TORSEMIDE (DEMADEX) 20 MG TABLET    Take one tablet daily to help regulate and prevent edema  Modified Medications   No medications on file  Discontinued Medications   No medications on file    Physical Exam: Filed Vitals:   10/24/14 1627  BP: 122/80  Pulse: 72  Temp: 97.8 F (36.6 C)  TempSrc: Tympanic  Resp: 18   There is no weight on file to calculate BMI.  Physical Exam  Constitutional: She is oriented to person, place, and time. She appears well-developed and well-nourished. No distress.  HENT:  Head: Normocephalic and atraumatic.  Right Ear: External ear normal.  Left Ear: External ear  normal.  Nose: Nose normal.  Mouth/Throat: Oropharynx is clear and moist.  Loss of hearing bilaterally.  Eyes: Conjunctivae and EOM are normal. Pupils are equal, round, and reactive to light.  Corrective lenses and prisms on her glasses.  Neck: No JVD present. No tracheal deviation present. No thyromegaly present.  Cardiovascular:  Regular rhythm. Grade 3/6 systolic ejection murmur at left sternal border. No heaves or thrills on palpation. Dorsalis pedis and posterior tibial arteries have diminished pulses bilaterally. Varicose leg veins bilaterally.  Abdominal: She exhibits no distension and no mass. There is no tenderness.  Genitourinary:   labial atrophy  Musculoskeletal: She exhibits no edema.  Exquisitely tender in the medial side of the right knee. Crepitance in both knees. She is able to use her walker and a cane and walk unassisted.  Lymphadenopathy:    She has no cervical adenopathy.  Neurological: She is alert and oriented to person, place, and time. No cranial nerve deficit. Coordination normal.  Reduced vibratory sensation in the right leg and foot.  Skin: No rash noted. No erythema. No pallor.  Psychiatric: She has a normal mood and affect. Her behavior is normal. Judgment and thought content normal.    Labs reviewed: Basic Metabolic Panel:  Recent Labs  05/27/14 0418 05/29/14 1433  07/13/14 09/27/14 1028 10/23/14  NA 138 134*  < > 134* 141 138  K 4.5 4.6  < > 4.3 4.0 4.2  CL 104 102  --   --  107  --   CO2 25 25  --   --  26  --   GLUCOSE 94 87  --   --  113*  --   BUN 45* 57*  < > 52* 46* 63*  CREATININE 1.40* 1.86*  < > 1.4* 1.37* 1.5*  CALCIUM 8.9 9.1  --   --  9.4  --   < > = values in this interval not displayed.  Liver Function Tests:  Recent Labs  05/24/14 0326 06/19/14 07/13/14 10/23/14  AST 30 28 21 19   ALT 18  17 14 10   ALKPHOS 103 90 118 70  BILITOT 0.8  --   --   --   PROT 7.1  --   --   --   ALBUMIN 3.9  --   --   --     CBC:  Recent  Labs  05/24/14 0326 07/13/14 09/27/14 1028  WBC 8.5 9.5 9.6  NEUTROABS 6.3  --   --   HGB 12.5 11.1* 11.8*  HCT 37.0 33* 36.7  MCV 101.4*  --  98.4  PLT 267 299 300    Lab Results  Component Value Date   TSH 6.75* 10/23/2014   Lab Results  Component Value Date   HGBA1C 5.4 07/13/2014   Lab Results  Component Value Date   CHOL 174 06/07/2012   HDL 65 06/07/2012   LDLCALC 89 06/07/2012   TRIG 98 06/07/2012    Significant Diagnostic Results since last visit: elevated TSH and BNP  Patient Care Team: Estill Dooms, MD as PCP - General (Internal Medicine) Darlin Coco, MD as Consulting Physician (Cardiology) [facility]  Assessment/Plan Problem List Items Addressed This Visit    Chronic kidney disease, stage III (moderate) (Chronic)    10/23/14 Bun/creat 63/1.48, diuretic contributes to it. No significant changes since last renal function test.       Constipation    Stable, continue Colace bid.       Generalized anxiety disorder    Prn Diazepam q6h prn available to her for anxiety and muscle spasm.       Hypertension (Chronic)    Controlled, continue Torsemide 20mg , Losartain 25mg , Metoprolol 12.5mg  daily.       Hypothyroidism - Primary (Chronic)    10/23/14 TSH 6.749, increase Levothyroxine to 168mcg daily, update TSH in 8 weeks.       Right knee pain (Chronic)    Chronic, Diclofenac gel to the area, activity as tolerated.       Systolic and diastolic CHF, chronic (HCC) (Chronic)    BNP 363.7 10/23/14, Torsemide 20mg  since 10/18/14, continue to observe for s/s of CHF decompensation.           Family/ staff Communication: continue AL for care.   Labs/tests ordered: CMP, BNP, TSH done 10/23/14, repeat TSH in 8 weeks.   [facility] Jerome Viglione NP Geriatrics [facility] [phone],  [id number] On Call:  [phone] & follow prompts after 5pm & weekends Office Phone:  [phone] Office Fax:   [phone]

## 2014-10-24 NOTE — Assessment & Plan Note (Signed)
10/23/14 Bun/creat 63/1.48, diuretic contributes to it. No significant changes since last renal function test.

## 2014-10-24 NOTE — Assessment & Plan Note (Signed)
Prn Diazepam q6h prn available to her for anxiety and muscle spasm.  

## 2014-10-24 NOTE — Assessment & Plan Note (Signed)
10/23/14 TSH 6.749, increase Levothyroxine to 178mcg daily, update TSH in 8 weeks.

## 2014-10-24 NOTE — Assessment & Plan Note (Signed)
Stable, continue Colace bid.  

## 2014-10-30 ENCOUNTER — Encounter: Payer: Self-pay | Admitting: Internal Medicine

## 2014-10-30 ENCOUNTER — Other Ambulatory Visit: Payer: Self-pay

## 2014-11-01 NOTE — Assessment & Plan Note (Signed)
Chronic, Diclofenac gel to the area, activity as tolerated.

## 2014-11-02 MED ORDER — DICLOFENAC SODIUM 1 % TD GEL: TRANSDERMAL | Status: DC

## 2014-11-02 NOTE — Addendum Note (Signed)
Addended by: Logan Bores on: 11/02/2014 04:03 PM   Modules accepted: Orders

## 2014-11-20 ENCOUNTER — Ambulatory Visit: Payer: Medicare Other | Admitting: Cardiology

## 2014-11-28 ENCOUNTER — Ambulatory Visit (INDEPENDENT_AMBULATORY_CARE_PROVIDER_SITE_OTHER): Payer: Medicare Other | Admitting: Cardiology

## 2014-11-28 ENCOUNTER — Encounter: Payer: Self-pay | Admitting: Cardiology

## 2014-11-28 VITALS — BP 118/60 | HR 65 | Ht 63.0 in | Wt 162.8 lb

## 2014-11-28 DIAGNOSIS — I340 Nonrheumatic mitral (valve) insufficiency: Secondary | ICD-10-CM

## 2014-11-28 DIAGNOSIS — I119 Hypertensive heart disease without heart failure: Secondary | ICD-10-CM | POA: Diagnosis not present

## 2014-11-28 DIAGNOSIS — I5032 Chronic diastolic (congestive) heart failure: Secondary | ICD-10-CM | POA: Diagnosis not present

## 2014-11-28 NOTE — Patient Instructions (Addendum)
Medication Instructions:  Your physician recommends that you continue on your current medications as directed. Please refer to the Current Medication list given to you today.  Labwork: none  Testing/Procedures: none  Follow-Up: Your physician recommends that you schedule a follow-up appointment in: 4 month ov with Tera Helper NP   If you need a refill on your cardiac medications before your next appointment, please call your pharmacy.

## 2014-11-28 NOTE — Progress Notes (Signed)
Cardiology Office Note   Date:  11/28/2014   ID:  DARISSA SPENNER, DOB 1918/12/14, MRN [id number]  PCP:  Estill Dooms, MD  Cardiologist: Darlin Coco MD  No chief complaint on file.     History of Present Illness: Ann [name] is a 81 y.o. female who presents for follow-up office visit.  She has a past history of palpitations and supraventricular tachycardia. She also has a history of mitral regurgitation. She had an echocardiogram in May 2011 which showed normal left ventricular systolic function with an ejection fraction of 55-60% and an impaired relaxation. She has had a past history of congestive heart failure secondary to diastolic dysfunction. She does not have any history of ischemic heart disease. She has a history of arthritis and previously had been on tramadol which was recently stopped by her primary care physician because of worsening renal function. She was seen as a work in September 2013 complaining of palpitations and she wore a 30 day monitor from 09/09/11 until 10/08/11 and the monitor did not show any significant arrhythmias. She was in normal sinus rhythm with occasional PVCs and PACs.   The patient was admitted to the hospital 5/19 until 05/2214 with acute on chronic systolic and diastolic heart failure and moderate to severe mitral regurgitation. She responded to Lasix. Conservative management was undertaken. She did not require TEE. Because of her advanced age, she is not a candidate for surgery on her mitral valve. Since discharge from the hospital she has been doing well. Her weight has been stable. She is now residing in an assisted living facility. Her blood pressure has been somewhat labile. The nursing staff has been holding her beta blocker at times. The patient continues to have some problems with peripheral edema.  Recently she had developed a rash and Dr. Nyoka Cowden switched her from furosemide to torsemide and the rash appeared to disappear.  Her  fluid status has remained stable on torsemide. On 09/27/14 the patient developed some atypical chest pain after getting the flu shot.  She was taken to the [facility] emergency room where she was observed and released.  No evidence of myocardial infarct.   Past Medical History  Diagnosis Date  . Mitral regurgitation   . Hypertension   . Palpitations   . Nocturnal dyspnea   . Advanced age   . Chronic diastolic CHF (congestive heart failure) ([place])     Last echo in 5/11  . Pain in joint, lower leg 02/25/2012  . Pain in joint, lower leg 02/23/2012  . Encounter for long-term (current) use of other medications 10/02/2011  . Cough 07/17/2011  . Internal hemorrhoids without mention of complication [id number]  . Unspecified constipation 01/01/2011  . Rash and other nonspecific skin eruption 08/14/2010  . Tension headache 07/31/2010  . Benign paroxysmal positional vertigo 07/24/2010  . Cervicalgia 07/24/2010  . Urinary tract infection, site not specified 07/10/2010  . Abnormality of gait 05/09/2009  . Vitamin D deficiency 01/25/2009  . Lumbago 01/05/2008  . Cervicitis and endocervicitis 12/22/2007  . Actinic keratosis 12/22/2007  . Pain in limb 12/22/2007  . Muscle weakness (generalized) 08/04/2007  . Blepharochalasis 05/12/2007  . Insomnia, unspecified 05/12/2007  . Dermatophytosis of foot 07/14/2006  . Dizziness and giddiness 06/25/2006  . Allergic rhinitis due to pollen 04/21/2006  . Dyskinesia of esophagus 04/12/2006  . Other malaise and fatigue 04/01/2006  . Unspecified urinary incontinence 02/10/2006  . Cramp of limb 10/07/2005  . Restless legs syndrome (RLS) 06/30/2005  .  CKD (chronic kidney disease), stage III   . Hypothyroidism   . Diaphragmatic hernia without mention of obstruction or gangrene 10/03/2003  . Unspecified venous (peripheral) insufficiency 01/05/1998  . Reflux esophagitis 01/06/1996  . Peptic ulcer, unspecified site, unspecified as acute or chronic, without mention of hemorrhage, perforation, or  obstruction 01/06/1996  . Diverticulosis of colon (without mention of hemorrhage) 01/06/1996  . Pain in right knee 08/08/2012  . Macular degeneration of right eye 1980's  . Macular degeneration 09/21/2013  . Varicose vein of leg 02/08/2014  . Mitral valve regurgitation     Moderate-severe by echo 05/2014  . Systolic and diastolic CHF, chronic ([place])     Past Surgical History  Procedure Laterality Date  . Tonsillectomy and adenoidectomy  1940  . Appendectomy  1940's  . Oophorectomy  1940's  . Breast surgery  1950's    benign tumors removed  . Cataract extraction w/ intraocular lens  implant, bilateral  1982  . Lesion excision  04/2002    melanomas of scalp  Dr. Link Snuffer  . Skin cancer excision  2005    BCC of right temple  . Cholecystectomy, laparoscopic  10/2004    Dr. Deon Pilling  . Tooth extraction  06/2008    and bone implant  Dr. Matilde Haymaker  . Colonoscopy  2000     Current Outpatient Prescriptions  Medication Sig Dispense Refill  . acetaminophen (TYLENOL) 500 MG tablet Take 500 mg by mouth 3 (three) times daily.     Marland Kitchen alum & mag hydroxide-simeth (MAALOX PLUS) 400-400-40 MG/5ML suspension Take 30 mLs by mouth 4 (four) times daily as needed (heartburn).    Marland Kitchen aspirin EC 81 MG tablet Take 81 mg by mouth daily.    . benzonatate (TESSALON) 100 MG capsule Take 1 capsule (100 mg total) by mouth 3 (three) times daily as needed for cough. 20 capsule 0  . Calcium & Magnesium Carbonates (MYLANTA PO) Take 30 mLs by mouth as needed (indigestion).     . calcium-vitamin D (OSCAL WITH D) 500-200 MG-UNIT per tablet Take 1 tablet by mouth daily with breakfast.    . cetirizine (ZYRTEC) 10 MG tablet Take 5 mg by mouth daily.     . Cholecalciferol (VITAMIN D3) 1000 UNITS CAPS Take 1 capsule by mouth daily.     Marland Kitchen docusate sodium (COLACE) 100 MG capsule Take 1 capsule (100 mg total) by mouth every 12 (twelve) hours. 30 capsule 0  . fluticasone (FLONASE) 50 MCG/ACT nasal spray Place 1 spray into both  nostrils daily.     Marland Kitchen guaiFENesin (MUCINEX) 600 MG 12 hr tablet Take 600 mg by mouth as needed for cough.    . levothyroxine (SYNTHROID, LEVOTHROID) 100 MCG tablet Take 100 mcg by mouth daily before breakfast.    . losartan (COZAAR) 25 MG tablet Take 25 mg by mouth daily.    . meclizine (ANTIVERT) 25 MG tablet Take 25 mg by mouth as needed for dizziness.     . metoprolol tartrate (LOPRESSOR) 25 MG tablet Take 25 mg by mouth daily.    . multivitamin (THERAGRAN) per tablet Take 1 tablet by mouth daily.      Marland Kitchen nystatin cream (MYCOSTATIN) Apply topically 2 (two) times daily. 30 g 0  . potassium chloride SA (K-DUR,KLOR-CON) 20 MEQ tablet Take 1 tablet (20 mEq total) by mouth daily. 30 tablet 3  . PREMARIN vaginal cream Place 1 Applicatorful vaginally once a week.     . torsemide (DEMADEX) 20 MG tablet Take one  tablet daily to help regulate and prevent edema 30 tablet 5   No current facility-administered medications for this visit.    Allergies:   Protonix; Ace inhibitors; Amoxicillin; Biaxin; Codeine; Darvon; Ibuprofen; Mercurochrome; Pantoprazole; Prilosec; Septra; Sulfa drugs cross reactors; Tramadol; and Vesicare    Social History:  The patient  reports that she has never smoked. She has never used smokeless tobacco. She reports that she does not drink alcohol or use illicit drugs.   Family History:  The patient's family history includes Alcohol abuse in her sister; Diabetes in her father; Heart disease in her brother and father; Stroke in her mother.    ROS:  Please see the history of present illness.   Otherwise, review of systems are positive for none.   All other systems are reviewed and negative.    PHYSICAL EXAM: VS:  BP 118/60 mmHg  Pulse 65  Ht 5\' 3"  (1.6 m)  Wt 162 lb 12.8 oz (73.846 kg)  BMI 28.85 kg/m2 , BMI Body mass index is 28.85 kg/(m^2). GEN: Well nourished, well developed, in no acute distress HEENT: normal Neck: no JVD, carotid bruits, or masses Cardiac: RRR;  grade  3/6 apical murmur of mitral regurgitation is present.  No , rubs, or gallops,no edema  Respiratory:  clear to auscultation bilaterally, normal work of breathing GI: soft, nontender, nondistended, + BS MS: no deformity or atrophy Skin: warm and dry, no rash Neuro:  Strength and sensation are intact Psych: euthymic mood, full affect   EKG:  EKG is ordered today. The ekg ordered today demonstrates normal sinus rhythm with frequent PVCs.  Nonspecific ST and T-wave changes.   Recent Labs: 09/27/2014: B Natriuretic Peptide 584.2*; Hemoglobin 11.8*; Platelets 300 10/23/2014: ALT 10; BUN 63*; Creatinine 1.5*; Potassium 4.2; Sodium 138; TSH 6.75*    Lipid Panel    Component Value Date/Time   CHOL 174 06/07/2012   TRIG 98 06/07/2012   HDL 65 06/07/2012   LDLCALC 89 06/07/2012      Wt Readings from Last 3 Encounters:  11/28/14 162 lb 12.8 oz (73.846 kg)  10/18/14 164 lb (74.39 kg)  06/14/14 169 lb (76.658 kg)         ASSESSMENT AND PLAN:  1. Recent exacerbation of acute on chronic systolic and diastolic heart failure. 2. Moderate to severe mitral regurgitation 3. Chronic kidney disease stage III  4. Hypothyroidism. On Synthroid, managed by Dr. Nyoka Cowden, her PCP 5. Essential hypertension   Current medicines are reviewed at length with the patient today. The patient has no concerns regarding medicines.     Current medicines are reviewed at length with the patient today.  The patient does not have concerns regarding medicines.  The following changes have been made:  no change  Labs/ tests ordered today include:   Orders Placed This Encounter  Procedures  . EKG 12-Lead     Disposition: Continue same medication.  Recheck in 4 months for office visit with Sindy Guadeloupe, Darlin Coco MD 11/28/2014 5:19 PM    [address], [place], [place]  [address] Phone: [phone]; Fax: [phone]

## 2014-12-07 ENCOUNTER — Encounter (HOSPITAL_COMMUNITY): Payer: Self-pay | Admitting: *Deleted

## 2014-12-07 ENCOUNTER — Emergency Department (HOSPITAL_COMMUNITY): Payer: Medicare Other

## 2014-12-07 ENCOUNTER — Emergency Department (HOSPITAL_COMMUNITY)
Admission: EM | Admit: 2014-12-07 | Discharge: 2014-12-08 | Disposition: A | Discharge status: Home / Self Care | Payer: Medicare Other | Attending: Emergency Medicine | Admitting: Emergency Medicine

## 2014-12-07 DIAGNOSIS — I340 Nonrheumatic mitral (valve) insufficiency: Secondary | ICD-10-CM | POA: Diagnosis not present

## 2014-12-07 DIAGNOSIS — Z8619 Personal history of other infectious and parasitic diseases: Secondary | ICD-10-CM | POA: Diagnosis not present

## 2014-12-07 DIAGNOSIS — E039 Hypothyroidism, unspecified: Secondary | ICD-10-CM | POA: Diagnosis not present

## 2014-12-07 DIAGNOSIS — Z87440 Personal history of urinary (tract) infections: Secondary | ICD-10-CM | POA: Insufficient documentation

## 2014-12-07 DIAGNOSIS — Z79899 Other long term (current) drug therapy: Secondary | ICD-10-CM | POA: Diagnosis not present

## 2014-12-07 DIAGNOSIS — Z880 Allergy status to penicillin: Secondary | ICD-10-CM | POA: Insufficient documentation

## 2014-12-07 DIAGNOSIS — E559 Vitamin D deficiency, unspecified: Secondary | ICD-10-CM | POA: Insufficient documentation

## 2014-12-07 DIAGNOSIS — N183 Chronic kidney disease, stage 3 (moderate): Secondary | ICD-10-CM | POA: Diagnosis not present

## 2014-12-07 DIAGNOSIS — I5042 Chronic combined systolic (congestive) and diastolic (congestive) heart failure: Secondary | ICD-10-CM | POA: Diagnosis not present

## 2014-12-07 DIAGNOSIS — I129 Hypertensive chronic kidney disease with stage 1 through stage 4 chronic kidney disease, or unspecified chronic kidney disease: Secondary | ICD-10-CM | POA: Diagnosis not present

## 2014-12-07 DIAGNOSIS — Z8711 Personal history of peptic ulcer disease: Secondary | ICD-10-CM | POA: Diagnosis not present

## 2014-12-07 DIAGNOSIS — G8929 Other chronic pain: Secondary | ICD-10-CM | POA: Insufficient documentation

## 2014-12-07 DIAGNOSIS — Z7982 Long term (current) use of aspirin: Secondary | ICD-10-CM | POA: Diagnosis not present

## 2014-12-07 DIAGNOSIS — Z872 Personal history of diseases of the skin and subcutaneous tissue: Secondary | ICD-10-CM | POA: Diagnosis not present

## 2014-12-07 DIAGNOSIS — M25551 Pain in right hip: Secondary | ICD-10-CM | POA: Diagnosis present

## 2014-12-07 DIAGNOSIS — M1611 Unilateral primary osteoarthritis, right hip: Secondary | ICD-10-CM | POA: Diagnosis not present

## 2014-12-07 DIAGNOSIS — Z8742 Personal history of other diseases of the female genital tract: Secondary | ICD-10-CM | POA: Insufficient documentation

## 2014-12-07 DIAGNOSIS — K210 Gastro-esophageal reflux disease with esophagitis: Secondary | ICD-10-CM | POA: Diagnosis not present

## 2014-12-07 LAB — BASIC METABOLIC PANEL
Anion gap: 8 (ref 5–15)
BUN: 61 mg/dL — AB (ref 6–20)
CHLORIDE: 107 mmol/L (ref 101–111)
CO2: 22 mmol/L (ref 22–32)
CREATININE: 1.51 mg/dL — AB (ref 0.44–1.00)
Calcium: 9.3 mg/dL (ref 8.9–10.3)
GFR calc Af Amer: 33 mL/min — ABNORMAL LOW (ref >60)
GFR calc non Af Amer: 28 mL/min — ABNORMAL LOW (ref >60)
GLUCOSE: 95 mg/dL (ref 65–99)
Potassium: 4 mmol/L (ref 3.5–5.1)
SODIUM: 137 mmol/L (ref 135–145)

## 2014-12-07 LAB — CBC WITH DIFFERENTIAL/PLATELET
Basophils Absolute: 0 10*3/uL (ref 0.0–0.1)
Basophils Relative: 1 %
EOS ABS: 0.3 10*3/uL (ref 0.0–0.7)
Eosinophils Relative: 3 %
HCT: 36.6 % (ref 36.0–46.0)
HEMOGLOBIN: 12.1 g/dL (ref 12.0–15.0)
LYMPHS ABS: 2.3 10*3/uL (ref 0.7–4.0)
Lymphocytes Relative: 27 %
MCH: 32.5 pg (ref 26.0–34.0)
MCHC: 33.1 g/dL (ref 30.0–36.0)
MCV: 98.4 fL (ref 78.0–100.0)
MONOS PCT: 8 %
Monocytes Absolute: 0.7 10*3/uL (ref 0.1–1.0)
NEUTROS PCT: 61 %
Neutro Abs: 5 10*3/uL (ref 1.7–7.7)
Platelets: 246 10*3/uL (ref 150–400)
RBC: 3.72 MIL/uL — ABNORMAL LOW (ref 3.87–5.11)
RDW: 14 % (ref 11.5–15.5)
WBC: 8.2 10*3/uL (ref 4.0–10.5)

## 2014-12-07 LAB — URINALYSIS, ROUTINE W REFLEX MICROSCOPIC
BILIRUBIN URINE: NEGATIVE
Glucose, UA: NEGATIVE mg/dL
HGB URINE DIPSTICK: NEGATIVE
Ketones, ur: NEGATIVE mg/dL
Leukocytes, UA: NEGATIVE
NITRITE: NEGATIVE
PH: 5 (ref 5.0–8.0)
Protein, ur: NEGATIVE mg/dL
SPECIFIC GRAVITY, URINE: 1.013 (ref 1.005–1.030)

## 2014-12-07 MED ORDER — ACETAMINOPHEN 500 MG PO TABS
500.0000 mg | ORAL_TABLET | Freq: Once | ORAL | Status: AC
  Administered 2014-12-07: 500 mg via ORAL
  Filled 2014-12-07: qty 1

## 2014-12-07 MED ORDER — MORPHINE SULFATE (PF) 4 MG/ML IV SOLN
4.0000 mg | Freq: Once | INTRAVENOUS | Status: DC
Start: 2014-12-07 — End: 2014-12-08
  Filled 2014-12-07: qty 1

## 2014-12-07 MED ORDER — ACETAMINOPHEN 500 MG PO TABS: 500.0000 mg | ORAL_TABLET | ORAL | Status: AC | PRN

## 2014-12-07 NOTE — ED Provider Notes (Signed)
CSN: [id number]     Arrival date & time 12/07/14  1710 History   First MD Initiated Contact with Patient 12/07/14 1733     Chief Complaint  Patient presents with  . Hip Pain     (Consider location/radiation/quality/duration/timing/severity/associated sxs/prior Treatment) HPI Comments: 81 y.o. Female with history of HTN, chronic pain, palpitations presents for right hip pain.  The patient reports that over the last 2-3 days she has had pain in her right hip/groin area.  She reports that she has had it in the past but that it just restarted over the last couple of days.  Denies known trauma but reports that she has an electric bed which is too high for her and is hard to get in and out of.  Denied fever, chills, nausea, vomiting, dysuria.  No change in sensation or strength.   Past Medical History  Diagnosis Date  . Mitral regurgitation   . Hypertension   . Palpitations   . Nocturnal dyspnea   . Advanced age   . Chronic diastolic CHF (congestive heart failure) ([place])     Last echo in 5/11  . Pain in joint, lower leg 02/25/2012  . Pain in joint, lower leg 02/23/2012  . Encounter for long-term (current) use of other medications 10/02/2011  . Cough 07/17/2011  . Internal hemorrhoids without mention of complication [id number]  . Unspecified constipation 01/01/2011  . Rash and other nonspecific skin eruption 08/14/2010  . Tension headache 07/31/2010  . Benign paroxysmal positional vertigo 07/24/2010  . Cervicalgia 07/24/2010  . Urinary tract infection, site not specified 07/10/2010  . Abnormality of gait 05/09/2009  . Vitamin D deficiency 01/25/2009  . Lumbago 01/05/2008  . Cervicitis and endocervicitis 12/22/2007  . Actinic keratosis 12/22/2007  . Pain in limb 12/22/2007  . Muscle weakness (generalized) 08/04/2007  . Blepharochalasis 05/12/2007  . Insomnia, unspecified 05/12/2007  . Dermatophytosis of foot 07/14/2006  . Dizziness and giddiness 06/25/2006  . Allergic rhinitis due to pollen 04/21/2006   . Dyskinesia of esophagus 04/12/2006  . Other malaise and fatigue 04/01/2006  . Unspecified urinary incontinence 02/10/2006  . Cramp of limb 10/07/2005  . Restless legs syndrome (RLS) 06/30/2005  . CKD (chronic kidney disease), stage III   . Hypothyroidism   . Diaphragmatic hernia without mention of obstruction or gangrene 10/03/2003  . Unspecified venous (peripheral) insufficiency 01/05/1998  . Reflux esophagitis 01/06/1996  . Peptic ulcer, unspecified site, unspecified as acute or chronic, without mention of hemorrhage, perforation, or obstruction 01/06/1996  . Diverticulosis of colon (without mention of hemorrhage) 01/06/1996  . Pain in right knee 08/08/2012  . Macular degeneration of right eye 1980's  . Macular degeneration 09/21/2013  . Varicose vein of leg 02/08/2014  . Mitral valve regurgitation     Moderate-severe by echo 05/2014  . Systolic and diastolic CHF, chronic ([place])    Past Surgical History  Procedure Laterality Date  . Tonsillectomy and adenoidectomy  1940  . Appendectomy  1940's  . Oophorectomy  1940's  . Breast surgery  1950's    benign tumors removed  . Cataract extraction w/ intraocular lens  implant, bilateral  1982  . Lesion excision  04/2002    melanomas of scalp  Dr. Link Snuffer  . Skin cancer excision  2005    BCC of right temple  . Cholecystectomy, laparoscopic  10/2004    Dr. Deon Pilling  . Tooth extraction  06/2008    and bone implant  Dr. Matilde Haymaker  . Colonoscopy  2000  Family History  Problem Relation Age of Onset  . Stroke Mother   . Heart disease Father   . Diabetes Father   . Heart disease Brother     MI  . Alcohol abuse Sister    Social History  Substance Use Topics  . Smoking status: Never Smoker   . Smokeless tobacco: Never Used  . Alcohol Use: No   OB History    No data available     Review of Systems  Constitutional: Negative for fever, chills and fatigue.  HENT: Negative for congestion and rhinorrhea.   Respiratory: Negative for chest  tightness and shortness of breath.   Cardiovascular: Negative for chest pain and palpitations.  Gastrointestinal: Negative for nausea, vomiting, abdominal pain and diarrhea.  Genitourinary: Negative for dysuria, urgency, frequency, flank pain and decreased urine volume.  Musculoskeletal: Positive for arthralgias (right hip/groin). Negative for back pain.  Skin: Negative for rash and wound.  Neurological: Negative for weakness and numbness.  Hematological: Does not bruise/bleed easily.      Allergies  Protonix; Ace inhibitors; Amoxicillin; Biaxin; Codeine; Darvon; Ibuprofen; Mercurochrome; Pantoprazole; Prilosec; Septra; Sulfa drugs cross reactors; Tramadol; and Vesicare  Home Medications   Prior to Admission medications   Medication Sig Start Date End Date Taking? Authorizing Provider  acetaminophen (TYLENOL) 325 MG tablet Take 650 mg by mouth every 4 (four) hours as needed for moderate pain.   Yes Historical Provider, MD  acetaminophen (TYLENOL) 500 MG tablet Take 500 mg by mouth 3 (three) times daily.    Yes Historical Provider, MD  aspirin EC 81 MG tablet Take 81 mg by mouth daily.   Yes Historical Provider, MD  Calcium Carbonate Antacid 400 MG CHEW Chew 800 mg by mouth every 4 (four) hours as needed (heartburn).   Yes Historical Provider, MD  calcium-vitamin D (OSCAL WITH D) 500-200 MG-UNIT per tablet Take 1 tablet by mouth daily with breakfast.   Yes Historical Provider, MD  cetirizine (ZYRTEC) 10 MG tablet Take 5 mg by mouth daily.    Yes Historical Provider, MD  Cholecalciferol (VITAMIN D3) 1000 UNITS CAPS Take 1 capsule by mouth daily.    Yes Historical Provider, MD  docusate sodium (COLACE) 100 MG capsule Take 1 capsule (100 mg total) by mouth every 12 (twelve) hours. 09/27/14  Yes Courteney Lyn Mackuen, MD  levothyroxine (SYNTHROID, LEVOTHROID) 100 MCG tablet Take 100 mcg by mouth daily before breakfast. 11/18/14  Yes Historical Provider, MD  losartan (COZAAR) 25 MG tablet Take  25 mg by mouth daily.   Yes Historical Provider, MD  menthol-cetylpyridinium (CEPACOL) 3 MG lozenge Take 1 lozenge by mouth every 2 (two) hours as needed for sore throat.   Yes Historical Provider, MD  metoprolol tartrate (LOPRESSOR) 25 MG tablet Take 12.5 mg by mouth at bedtime.    Yes Historical Provider, MD  multivitamin [facility]) per tablet Take 1 tablet by mouth daily.     Yes Historical Provider, MD  Polyethyl Glycol-Propyl Glycol (SYSTANE) 0.4-0.3 % SOLN Apply 1 drop to eye daily as needed (dry eyes).   Yes Historical Provider, MD  polyvinyl alcohol (LIQUIFILM TEARS) 1.4 % ophthalmic solution Place 1 drop into both eyes 4 (four) times daily as needed for dry eyes.   Yes Historical Provider, MD  potassium chloride SA (K-DUR,KLOR-CON) 20 MEQ tablet Take 1 tablet (20 mEq total) by mouth daily. 05/27/14  Yes Venetia Maxon Rama, MD  torsemide (DEMADEX) 20 MG tablet Take one tablet daily to help regulate and prevent edema Patient  taking differently: Take 20 mg by mouth daily.  10/18/14  Yes Estill Dooms, MD  alum & mag hydroxide-simeth (MAALOX PLUS) 400-400-40 MG/5ML suspension Take 30 mLs by mouth 4 (four) times daily as needed (heartburn).    Historical Provider, MD  benzonatate (TESSALON) 100 MG capsule Take 1 capsule (100 mg total) by mouth 3 (three) times daily as needed for cough. 05/27/14   Venetia Maxon Rama, MD  Calcium & Magnesium Carbonates (MYLANTA PO) Take 30 mLs by mouth as needed (indigestion).     Historical Provider, MD  fluticasone (FLONASE) 50 MCG/ACT nasal spray Place 1 spray into both nostrils daily.     Historical Provider, MD  guaiFENesin (MUCINEX) 600 MG 12 hr tablet Take 600 mg by mouth every 12 (twelve) hours as needed for cough.     Historical Provider, MD  meclizine (ANTIVERT) 25 MG tablet Take 25 mg by mouth daily as needed for dizziness.     Historical Provider, MD  nystatin cream (MYCOSTATIN) Apply topically 2 (two) times daily. Patient not taking: Reported on 12/07/2014  05/27/14   Venetia Maxon Rama, MD   BP 134/61 mmHg  Pulse 72  Temp(Src) 97.8 F (36.6 C) (Oral)  Resp 16  SpO2 97% Physical Exam  Constitutional: She is oriented to person, place, and time. She appears well-developed and well-nourished. No distress.  HENT:  Head: Normocephalic and atraumatic.  Right Ear: External ear normal.  Left Ear: External ear normal.  Nose: Nose normal.  Mouth/Throat: Oropharynx is clear and moist. No oropharyngeal exudate.  Eyes: EOM are normal. Pupils are equal, round, and reactive to light.  Neck: Normal range of motion. Neck supple.  Cardiovascular: Normal rate, regular rhythm and normal heart sounds.   Pulmonary/Chest: Effort normal. No respiratory distress. She has no wheezes. She has no rales.  Abdominal: Soft. She exhibits no distension. There is no tenderness.  Musculoskeletal: She exhibits no edema.       Right hip: She exhibits decreased range of motion (mild but no irritability on passive range of motion) and tenderness. She exhibits normal strength, no bony tenderness, no swelling, no crepitus and no deformity.       Right knee: Normal.       Right upper leg: Normal.  Neurological: She is alert and oriented to person, place, and time. She has normal strength. No sensory deficit.  Skin: Skin is warm and dry. No rash noted. She is not diaphoretic.  Vitals reviewed.   ED Course  Procedures (including critical care time) Labs Review Labs Reviewed  CBC WITH DIFFERENTIAL/PLATELET - Abnormal; Notable for the following:    RBC 3.72 (*)    All other components within normal limits  BASIC METABOLIC PANEL - Abnormal; Notable for the following:    BUN 61 (*)    Creatinine, Ser 1.51 (*)    GFR calc non Af Amer 28 (*)    GFR calc Af Amer 33 (*)    All other components within normal limits  URINALYSIS, ROUTINE W REFLEX MICROSCOPIC (NOT AT [facility])    Imaging Review Dg Hip Unilat With Pelvis 2-3 Views Right  12/07/2014  CLINICAL DATA:  Right hip pain, no  known injury. EXAM: DG HIP (WITH OR WITHOUT PELVIS) 2-3V RIGHT COMPARISON:  None. FINDINGS: Single view of the pelvis and two views of the right hip are provided. Right femoral head appears well positioned relative to the adjacent acetabulum. No fracture seen within the right femoral head or right femoral neck. Left femoral neck  cannot be evaluated due to patient positioning. No fracture seen within the osseous pelvis although some portions are obscured by overlying stool and bowel gas. Degenerative changes are seen within the scoliotic lower lumbar spine. Atherosclerotic vascular calcifications are seen within the upper thigh regions bilaterally. Soft tissues about the pelvis and right hip are otherwise unremarkable. IMPRESSION: 1. No acute findings. No fracture or dislocation about the right hip. 2. Degenerative changes within the scoliotic lower lumbar spine. Electronically Signed   By: Franki Cabot M.D.   On: 12/07/2014 20:05   I have personally reviewed and evaluated these images and lab results as part of my medical decision-making.   EKG Interpretation None      MDM  Patient seen and evaluated in stable condition.  Neurovascularly intact.  Xrays negative for acute process.  Patient ambulated without difficulty or significant pain.  UA not consistent with infection.  Patient requested instruction for PRN Tylenol at nursing facility.  Discussed results and plan of care with patient and her daughter who expressed understanding and agreement with plan of care.  Patient discharged in stable condition. Final diagnoses:  Right hip pain    1. Right hip pain, arthritis    Harvel Quale, MD 12/08/14 1051

## 2014-12-07 NOTE — ED Notes (Signed)
[id number] 

## 2014-12-07 NOTE — ED Notes (Signed)
Bed: [id number] Expected date:  Expected time:  Means of arrival:  Comments: Ems-2f weakness

## 2014-12-07 NOTE — ED Notes (Signed)
Patient states 3 nights ago she began to have right inner thigh/groin pain.  Patient states she has bed that is higher than usual and she thinks getting into and out of the bed contributed to the pain.  She has taken Tylenol with some relief.  Patient denies fall and trauma to hip/leg.  On exam, +2 pulses RLE. No shortening or external/internal rotation.  No edema or bruising.  Patient is painful to palpation of right groin.

## 2014-12-07 NOTE — ED Notes (Signed)
No I&O was done as pt was able to ambulate to the bathroom to void

## 2014-12-07 NOTE — Discharge Instructions (Signed)
Your hip pain is likely related to arthritis and muscle irritation.  Take tylenol as needed for pain control and try to walk as tolerated to keep yourself strong.  Follow up with your orthopedic surgery.  Hip Pain Your hip is the joint between your upper legs and your lower pelvis. The bones, cartilage, tendons, and muscles of your hip joint perform a lot of work each day supporting your body weight and allowing you to move around. Hip pain can range from a minor ache to severe pain in one or both of your hips. Pain may be felt on the inside of the hip joint near the groin, or the outside near the buttocks and upper thigh. You may have swelling or stiffness as well.  HOME CARE INSTRUCTIONS   Take medicines only as directed by your health care provider.  Apply ice to the injured area:  Put ice in a plastic bag.  Place a towel between your skin and the bag.  Leave the ice on for 15-20 minutes at a time, 3-4 times a day.  Keep your leg raised (elevated) when possible to lessen swelling.  Avoid activities that cause pain.  Follow specific exercises as directed by your health care provider.  Sleep with a pillow between your legs on your most comfortable side.  Record how often you have hip pain, the location of the pain, and what it feels like. SEEK MEDICAL CARE IF:   You are unable to put weight on your leg.  Your hip is red or swollen or very tender to touch.  Your pain or swelling continues or worsens after 1 week.  You have increasing difficulty walking.  You have a fever. SEEK IMMEDIATE MEDICAL CARE IF:   You have fallen.  You have a sudden increase in pain and swelling in your hip. MAKE SURE YOU:   Understand these instructions.  Will watch your condition.  Will get help right away if you are not doing well or get worse.   This information is not intended to replace advice given to you by your health care provider. Make sure you discuss any questions you have with  your health care provider.   Document Released: 06/11/2009 Document Revised: 01/12/2014 Document Reviewed: 08/18/2012 Elsevier Interactive Patient Education [employer].

## 2014-12-07 NOTE — ED Notes (Signed)
Report given to [facility].  Pt transported back with her daughter in [place]

## 2014-12-07 NOTE — ED Notes (Addendum)
Per EMS:  Patient comes from [facility] with right hip pain x2 days and no trauma.  Mobile xray at SNF "could not rule out right femur fracture."  Patient was sent for further evaluation.  No shortening or rotation noted to RLE.  Bp 124/66, HR 70.

## 2014-12-07 NOTE — ED Notes (Signed)
Came to check on pt and pt is not in room, has been taken to radiology, will check back to see pt and give medication that is ordered

## 2014-12-18 LAB — TSH: TSH: 5.54 u[IU]/mL (ref 0.41–5.90)

## 2014-12-20 ENCOUNTER — Other Ambulatory Visit: Payer: Self-pay | Admitting: Nurse Practitioner

## 2014-12-20 DIAGNOSIS — E039 Hypothyroidism, unspecified: Secondary | ICD-10-CM

## 2015-01-10 ENCOUNTER — Encounter: Payer: Self-pay | Admitting: Internal Medicine

## 2015-01-22 ENCOUNTER — Other Ambulatory Visit: Payer: Self-pay | Admitting: Internal Medicine

## 2015-01-23 ENCOUNTER — Other Ambulatory Visit: Payer: Self-pay | Admitting: *Deleted

## 2015-01-28 ENCOUNTER — Non-Acute Institutional Stay: Payer: Medicare Other | Admitting: Nurse Practitioner

## 2015-01-28 ENCOUNTER — Encounter: Payer: Self-pay | Admitting: Nurse Practitioner

## 2015-01-28 DIAGNOSIS — K5900 Constipation, unspecified: Secondary | ICD-10-CM

## 2015-01-28 DIAGNOSIS — M25561 Pain in right knee: Secondary | ICD-10-CM

## 2015-01-28 DIAGNOSIS — J208 Acute bronchitis due to other specified organisms: Secondary | ICD-10-CM | POA: Diagnosis not present

## 2015-01-28 DIAGNOSIS — N183 Chronic kidney disease, stage 3 unspecified: Secondary | ICD-10-CM

## 2015-01-28 DIAGNOSIS — I5042 Chronic combined systolic (congestive) and diastolic (congestive) heart failure: Secondary | ICD-10-CM | POA: Diagnosis not present

## 2015-01-28 DIAGNOSIS — F411 Generalized anxiety disorder: Secondary | ICD-10-CM | POA: Diagnosis not present

## 2015-01-28 DIAGNOSIS — E039 Hypothyroidism, unspecified: Secondary | ICD-10-CM

## 2015-01-28 DIAGNOSIS — N182 Chronic kidney disease, stage 2 (mild): Secondary | ICD-10-CM | POA: Diagnosis not present

## 2015-01-28 DIAGNOSIS — I10 Essential (primary) hypertension: Secondary | ICD-10-CM

## 2015-01-28 NOTE — Assessment & Plan Note (Signed)
Stable, continue Colace bid.  

## 2015-01-28 NOTE — Assessment & Plan Note (Signed)
BNP 363.7 10/23/14, Torsemide 20mg  since 10/18/14, continue to observe for s/s of CHF decompensation. Update BNP

## 2015-01-28 NOTE — Assessment & Plan Note (Signed)
Controlled, continue Torsemide 20mg, Losartain 25mg, Metoprolol 12.5mg daily.  

## 2015-01-28 NOTE — Assessment & Plan Note (Addendum)
01/28/15 sore throat, congestive cough, T 100.0, will CXR, Levaquin 500mg  qd x 7, Florastor bid x 7 days, Mucinex 600mg  po bid x 7 days.Duoneb tid x 3days, Medrol dose pack.  Obtain CBC

## 2015-01-28 NOTE — Assessment & Plan Note (Signed)
Prn Diazepam q6h prn available to her for anxiety and muscle spasm.  

## 2015-01-28 NOTE — Assessment & Plan Note (Addendum)
10/23/14 TSH 6.749, increase Levothyroxine to 14mcg daily 12/18/14 TSH 5.544, TSH 6 wks

## 2015-01-28 NOTE — Progress Notes (Signed)
Patient ID: Ann [name], female   DOB: [birth year], 82 y.o.   MRN: [id number]  Location:  [facility] Provider:  Marlana Latus NP  Code Status:  DNR Goals of care: Advanced Directive information    Chief Complaint  Patient presents with  . Medical Management of Chronic Issues  . Acute Visit    sore throat, congestive cough, T 100.0     HPI: Patient is a 82 y.o. female seen in the AL at [facility] today for evaluation of sore throat, congestive cough, thyroid, CHF  Review of Systems  Constitutional: Negative.   HENT: Positive for hearing loss and sore throat.   Eyes: Negative.   Respiratory: Positive for cough and shortness of breath.   Cardiovascular: Positive for leg swelling.       Hospitalized May [id number] for acute systolic and diastolic congestive heart failure.  Gastrointestinal: Negative.   Genitourinary:       Stress incontinence.  Musculoskeletal:       Chronic knee pains. Uses a walker.  Skin:       Improved perirectal and labial burning  Neurological: Negative.   Psychiatric/Behavioral:       Depressive symptoms come and go.    Past Medical History  Diagnosis Date  . Mitral regurgitation   . Hypertension   . Palpitations   . Nocturnal dyspnea   . Advanced age   . Chronic diastolic CHF (congestive heart failure) ([place])     Last echo in 5/11  . Pain in joint, lower leg 02/25/2012  . Pain in joint, lower leg 02/23/2012  . Encounter for long-term (current) use of other medications 10/02/2011  . Cough 07/17/2011  . Internal hemorrhoids without mention of complication [id number]  . Unspecified constipation 01/01/2011  . Rash and other nonspecific skin eruption 08/14/2010  . Tension headache 07/31/2010  . Benign paroxysmal positional vertigo 07/24/2010  . Cervicalgia 07/24/2010  . Urinary tract infection, site not specified 07/10/2010  . Abnormality of gait 05/09/2009  . Vitamin D deficiency 01/25/2009  . Lumbago 01/05/2008  . Cervicitis and endocervicitis 12/22/2007   . Actinic keratosis 12/22/2007  . Pain in limb 12/22/2007  . Muscle weakness (generalized) 08/04/2007  . Blepharochalasis 05/12/2007  . Insomnia, unspecified 05/12/2007  . Dermatophytosis of foot 07/14/2006  . Dizziness and giddiness 06/25/2006  . Allergic rhinitis due to pollen 04/21/2006  . Dyskinesia of esophagus 04/12/2006  . Other malaise and fatigue 04/01/2006  . Unspecified urinary incontinence 02/10/2006  . Cramp of limb 10/07/2005  . Restless legs syndrome (RLS) 06/30/2005  . CKD (chronic kidney disease), stage III   . Hypothyroidism   . Diaphragmatic hernia without mention of obstruction or gangrene 10/03/2003  . Unspecified venous (peripheral) insufficiency 01/05/1998  . Reflux esophagitis 01/06/1996  . Peptic ulcer, unspecified site, unspecified as acute or chronic, without mention of hemorrhage, perforation, or obstruction 01/06/1996  . Diverticulosis of colon (without mention of hemorrhage) 01/06/1996  . Pain in right knee 08/08/2012  . Macular degeneration of right eye 1980's  . Macular degeneration 09/21/2013  . Varicose vein of leg 02/08/2014  . Mitral valve regurgitation     Moderate-severe by echo 05/2014  . Systolic and diastolic CHF, chronic [facility])     Patient Active Problem List   Diagnosis Date Noted  . Constipation 10/24/2014  . Musculoskeletal chest pain 09/27/2014  . CKD (chronic kidney disease), stage II 07/19/2014  . Acute bronchitis 07/16/2014  . Generalized anxiety disorder 07/16/2014  . Right knee pain 06/14/2014  .  Dyspnea 06/14/2014  . Systolic and diastolic CHF, chronic ([place]) 05/27/2014  . Candida infection of genital region 05/26/2014  . Mitral valve regurgitation 05/26/2014  . Bunion of great toe 02/08/2014  . Varicose vein of leg 02/08/2014  . Macular degeneration 09/21/2013  . Atrophic vaginitis 07/13/2013  . Urinary incontinence 09/22/2012  . Hypertension   . Diplopia 09/12/2012  . Chronic kidney disease, stage III (moderate) 08/08/2012  . PVC's (premature  ventricular contractions) 09/08/2011  . Hypothyroidism 04/17/2010  . Spinal stenosis, lumbar 04/17/2010  . Abnormality of gait 05/09/2009  . Reflux esophagitis 01/06/1996  . Peptic ulcer, unspecified site, unspecified as acute or chronic, without mention of hemorrhage, perforation, or obstruction 01/06/1996    Allergies  Allergen Reactions  . Protonix [Pantoprazole Sodium] Rash  . Ace Inhibitors Cough  . Amoxicillin Diarrhea  . Biaxin [Clarithromycin]     Noted on info sheet from independent living  . Codeine   . Darvon   . Ibuprofen Other (See Comments)    Noted on info sheet from independent living  . Mercurochrome [Merbromin (Mercurochrome)]   . Pantoprazole     Dizzy  . Prilosec [Omeprazole] Other (See Comments)    Noted on info sheet from independent living  . Septra [Sulfamethoxazole-Trimethoprim]   . Sulfa Drugs Cross Reactors   . Tramadol     Abnormal kidney function  . Vesicare [Solifenacin]     Drys out nose and throat     Medications: Patient's Medications  New Prescriptions   No medications on file  Previous Medications   ACETAMINOPHEN (TYLENOL) 500 MG TABLET    Take 1 tablet (500 mg total) by mouth every 4 (four) hours as needed for mild pain or moderate pain.   ALUM & MAG HYDROXIDE-SIMETH (MAALOX PLUS) 400-400-40 MG/5ML SUSPENSION    Take 30 mLs by mouth 4 (four) times daily as needed (heartburn).   ASPIRIN EC 81 MG TABLET    Take 81 mg by mouth daily.   BENZONATATE (TESSALON) 100 MG CAPSULE    Take 1 capsule (100 mg total) by mouth 3 (three) times daily as needed for cough.   CALCIUM & MAGNESIUM CARBONATES (MYLANTA PO)    Take 30 mLs by mouth as needed (indigestion).    CALCIUM CARBONATE ANTACID 400 MG CHEW    Chew 800 mg by mouth every 4 (four) hours as needed (heartburn).   CALCIUM-VITAMIN D (OSCAL WITH D) 500-200 MG-UNIT PER TABLET    Take 1 tablet by mouth daily with breakfast.   CETIRIZINE (ZYRTEC) 10 MG TABLET    Take 5 mg by mouth daily.     CHOLECALCIFEROL (VITAMIN D3) 1000 UNITS CAPS    Take 1 capsule by mouth daily.    DOCUSATE SODIUM (COLACE) 100 MG CAPSULE    Take 1 capsule (100 mg total) by mouth every 12 (twelve) hours.   FLUTICASONE (FLONASE) 50 MCG/ACT NASAL SPRAY    Place 1 spray into both nostrils daily.    GUAIFENESIN (MUCINEX) 600 MG 12 HR TABLET    Take 600 mg by mouth every 12 (twelve) hours as needed for cough.    LEVOTHYROXINE (SYNTHROID, LEVOTHROID) 100 MCG TABLET    Take 100 mcg by mouth daily before breakfast.   LOSARTAN (COZAAR) 25 MG TABLET    Take 25 mg by mouth daily.   MECLIZINE (ANTIVERT) 25 MG TABLET    Take 25 mg by mouth daily as needed for dizziness.    MENTHOL-CETYLPYRIDINIUM (CEPACOL) 3 MG LOZENGE    Take 1  lozenge by mouth every 2 (two) hours as needed for sore throat.   METOPROLOL TARTRATE (LOPRESSOR) 25 MG TABLET    Take 12.5 mg by mouth at bedtime.    MULTIVITAMIN (THERAGRAN) PER TABLET    Take 1 tablet by mouth daily.     NYSTATIN CREAM (MYCOSTATIN)    Apply topically 2 (two) times daily.   POLYETHYL GLYCOL-PROPYL GLYCOL (SYSTANE) 0.4-0.3 % SOLN    Apply 1 drop to eye daily as needed (dry eyes).   POLYVINYL ALCOHOL (LIQUIFILM TEARS) 1.4 % OPHTHALMIC SOLUTION    Place 1 drop into both eyes 4 (four) times daily as needed for dry eyes.   POTASSIUM CHLORIDE SA (K-DUR,KLOR-CON) 20 MEQ TABLET    Take 1 tablet (20 mEq total) by mouth daily.   PREMARIN VAGINAL CREAM    APPLY 2 GRAMS EVERY OTHER NIGHT TO HELP VAGINAL DISCOMFORT   TORSEMIDE (DEMADEX) 20 MG TABLET    Take one tablet daily to help regulate and prevent edema  Modified Medications   No medications on file  Discontinued Medications   No medications on file    Physical Exam: Filed Vitals:   01/28/15 1406  BP: 112/60  Pulse: 72  Temp: 100 F (37.8 C)  TempSrc: Tympanic  Resp: 20   There is no weight on file to calculate BMI.  Physical Exam  Constitutional: She is oriented to person, place, and time. She appears well-developed and  well-nourished. No distress.  HENT:  Head: Normocephalic and atraumatic.  Right Ear: External ear normal.  Left Ear: External ear normal.  Nose: Nose normal.  Mouth/Throat: Oropharynx is clear and moist.  Loss of hearing bilaterally.  Eyes: Conjunctivae and EOM are normal. Pupils are equal, round, and reactive to light.  Corrective lenses and prisms on her glasses.  Neck: No JVD present. No tracheal deviation present. No thyromegaly present.  Cardiovascular:  Regular rhythm. Grade 3/6 systolic ejection murmur at left sternal border. No heaves or thrills on palpation. Dorsalis pedis and posterior tibial arteries have diminished pulses bilaterally. Varicose leg veins bilaterally.  Pulmonary/Chest: She has rales.  Coarse rales and wheezes posterior mid to lower lungs, R>L  Abdominal: She exhibits no distension and no mass. There is no tenderness.  Genitourinary:   labial atrophy  Musculoskeletal: She exhibits edema.  Exquisitely tender in the medial side of the right knee. Crepitance in both knees. She is able to use her walker and a cane and walk unassisted. Trace edema in ankles.   Lymphadenopathy:    She has no cervical adenopathy.  Neurological: She is alert and oriented to person, place, and time. No cranial nerve deficit. Coordination normal.  Reduced vibratory sensation in the right leg and foot.  Skin: No rash noted. No erythema. No pallor.  Psychiatric: She has a normal mood and affect. Her behavior is normal. Judgment and thought content normal.    Labs reviewed: Basic Metabolic Panel:  Recent Labs  05/29/14 1433  09/27/14 1028 10/23/14 12/07/14 1908  NA 134*  < > 141 138 137  K 4.6  < > 4.0 4.2 4.0  CL 102  --  107  --  107  CO2 25  --  26  --  22  GLUCOSE 87  --  113*  --  95  BUN 57*  < > 46* 63* 61*  CREATININE 1.86*  < > 1.37* 1.5* 1.51*  CALCIUM 9.1  --  9.4  --  9.3  < > = values in this interval  not displayed.  Liver Function Tests:  Recent Labs   05/24/14 0326 06/19/14 07/13/14 10/23/14  AST 30 28 21 19   ALT 18 17 14 10   ALKPHOS 103 90 118 70  BILITOT 0.8  --   --   --   PROT 7.1  --   --   --   ALBUMIN 3.9  --   --   --     CBC:  Recent Labs  05/24/14 0326 07/13/14 09/27/14 1028 12/07/14 1908  WBC 8.5 9.5 9.6 8.2  NEUTROABS 6.3  --   --  5.0  HGB 12.5 11.1* 11.8* 12.1  HCT 37.0 33* 36.7 36.6  MCV 101.4*  --  98.4 98.4  PLT 267 299 300 246    Lab Results  Component Value Date   TSH 5.54 12/18/2014   Lab Results  Component Value Date   HGBA1C 5.4 07/13/2014   Lab Results  Component Value Date   CHOL 174 06/07/2012   HDL 65 06/07/2012   LDLCALC 89 06/07/2012   TRIG 98 06/07/2012    Significant Diagnostic Results since last visit: elevated TSH and BNP  Patient Care Team: Estill Dooms, MD as PCP - General (Internal Medicine) Darlin Coco, MD as Consulting Physician (Cardiology) [facility]  Assessment/Plan Problem List Items Addressed This Visit    Systolic and diastolic CHF, chronic (HCC) (Chronic)    BNP 363.7 10/23/14, Torsemide 20mg  since 10/18/14, continue to observe for s/s of CHF decompensation. Update BNP      Right knee pain (Chronic)    Chronic, Diclofenac gel to the area, activity as tolerated. Tylenol tid and prn available to her.       Hypothyroidism (Chronic)    10/23/14 TSH 6.749, increase Levothyroxine to 138mcg daily 12/18/14 TSH 5.544, TSH 6 wks      Hypertension (Chronic)    Controlled, continue Torsemide 20mg , Losartain 25mg , Metoprolol 12.5mg  daily.       Generalized anxiety disorder    Prn Diazepam q6h prn available to her for anxiety and muscle spasm.       Constipation    Stable, continue Colace bid.       CKD (chronic kidney disease), stage II    Update CMP, 07/13/14 Bun 52, creatinine 1.44      Chronic kidney disease, stage III (moderate) (Chronic)    10/23/14 Bun/creat 63/1.48, diuretic contributes to it. No significant changes since last  renal function test. Update CMP      Acute bronchitis - Primary    01/28/15 sore throat, congestive cough, T 100.0, will CXR, Levaquin 500mg  qd x 7, Florastor bid x 7 days, Mucinex 600mg  po bid x 7 days.Duoneb tid x 3days, Medrol dose pack.  Obtain CBC          Family/ staff Communication: continue AL for care.   Labs/tests ordered: CMP, BNP, CBC, CXR  [facility] Doralene Glanz NP Geriatrics [facility] [address], [place] [address] On Call:  [phone] & follow prompts after 5pm & weekends Office Phone:  [phone] Office Fax:  [phone]

## 2015-01-28 NOTE — Assessment & Plan Note (Signed)
10/23/14 Bun/creat 63/1.48, diuretic contributes to it. No significant changes since last renal function test. Update CMP

## 2015-01-28 NOTE — Assessment & Plan Note (Addendum)
Chronic, Diclofenac gel to the area, activity as tolerated. Tylenol tid and prn available to her.

## 2015-01-28 NOTE — Assessment & Plan Note (Signed)
Update CMP, 07/13/14 Bun 52, creatinine 1.44

## 2015-01-30 LAB — HEPATIC FUNCTION PANEL
ALT: 11 U/L (ref 7–35)
AST: 23 U/L (ref 13–35)
Alkaline Phosphatase: 84 U/L (ref 25–125)
BILIRUBIN, TOTAL: 0.7 mg/dL

## 2015-01-30 LAB — CBC AND DIFFERENTIAL
HEMATOCRIT: 36 % (ref 36–46)
HEMOGLOBIN: 12.5 g/dL (ref 12.0–16.0)
PLATELETS: 227 10*3/uL (ref 150–399)
WBC: 12.7 10*3/mL

## 2015-01-30 LAB — BASIC METABOLIC PANEL
BUN: 51 mg/dL — AB (ref 4–21)
CREATININE: 1.6 mg/dL — AB (ref 0.5–1.1)
Glucose: 107 mg/dL
Potassium: 3.9 mmol/L (ref 3.4–5.3)
Sodium: 138 mmol/L (ref 137–147)

## 2015-02-07 ENCOUNTER — Non-Acute Institutional Stay: Payer: Medicare Other | Admitting: Internal Medicine

## 2015-02-07 ENCOUNTER — Encounter: Payer: Self-pay | Admitting: Internal Medicine

## 2015-02-07 VITALS — BP 124/60 | HR 61 | Temp 97.4°F | Ht 63.0 in | Wt 165.0 lb

## 2015-02-07 DIAGNOSIS — N183 Chronic kidney disease, stage 3 unspecified: Secondary | ICD-10-CM

## 2015-02-07 DIAGNOSIS — E039 Hypothyroidism, unspecified: Secondary | ICD-10-CM | POA: Diagnosis not present

## 2015-02-07 DIAGNOSIS — I5042 Chronic combined systolic (congestive) and diastolic (congestive) heart failure: Secondary | ICD-10-CM | POA: Diagnosis not present

## 2015-02-07 DIAGNOSIS — I10 Essential (primary) hypertension: Secondary | ICD-10-CM

## 2015-02-07 LAB — BASIC METABOLIC PANEL
BUN: 74 mg/dL — AB (ref 4–21)
CREATININE: 1.9 mg/dL — AB (ref 0.5–1.1)
Glucose: 81 mg/dL
Potassium: 3.7 mmol/L (ref 3.4–5.3)
Sodium: 136 mmol/L — AB (ref 137–147)

## 2015-02-08 NOTE — Progress Notes (Signed)
Patient ID: Ann [name], female   DOB: Aug 14, 1918, 82 y.o.   MRN: [id number]    [place] Room Number: 637  Place of Service: Clinic (12)     Allergies  Allergen Reactions  . Protonix [Pantoprazole Sodium] Rash  . Ace Inhibitors Cough  . Amoxicillin Diarrhea  . Biaxin [Clarithromycin]     Noted on info sheet from independent living  . Codeine   . Darvon   . Ibuprofen Other (See Comments)    Noted on info sheet from independent living  . Mercurochrome [Merbromin (Mercurochrome)]   . Pantoprazole     Dizzy  . Prilosec [Omeprazole] Other (See Comments)    Noted on info sheet from independent living  . Septra [Sulfamethoxazole-Trimethoprim]   . Sulfa Drugs Cross Reactors   . Tramadol     Abnormal kidney function  . Vesicare [Solifenacin]     Drys out nose and throat     Chief Complaint  Patient presents with  . Medical Management of Chronic Issues    thyroid, CKD, CHF, blood pressure.      HPI:   Patient says she's had a rough time over the last month. She had an acute bronchitis that is slow to resolve. She does think she is beginning to get better now.  Chronic kidney disease, stage III (moderate) - stable  Essential hypertension - stable, controlled   Hypothyroidism, unspecified hypothyroidism type - TSH 12/18/2014 was normal  Systolic and diastolic CHF, chronic (HCC) - compensated     Medications: Patient's Medications  New Prescriptions   No medications on file  Previous Medications   ACETAMINOPHEN (TYLENOL) 500 MG TABLET    Take 1 tablet (500 mg total) by mouth every 4 (four) hours as needed for mild pain or moderate pain.   ALUM & MAG HYDROXIDE-SIMETH (MAALOX PLUS) 400-400-40 MG/5ML SUSPENSION    Take 30 mLs by mouth 4 (four) times daily as needed (heartburn).   ASPIRIN EC 81 MG TABLET    Take 81 mg by mouth daily.   BENZONATATE (TESSALON) 100 MG CAPSULE    Take 1 capsule (100 mg total) by mouth 3 (three) times daily  as needed for cough.   CALCIUM & MAGNESIUM CARBONATES (MYLANTA PO)    Take 30 mLs by mouth as needed (indigestion).    CALCIUM CARBONATE ANTACID 400 MG CHEW    Chew 800 mg by mouth every 4 (four) hours as needed (heartburn).   CALCIUM-VITAMIN D (OSCAL WITH D) 500-200 MG-UNIT PER TABLET    Take 1 tablet by mouth daily with breakfast.   CETIRIZINE (ZYRTEC) 10 MG TABLET    Take 5 mg by mouth daily.    CHOLECALCIFEROL (VITAMIN D3) 1000 UNITS CAPS    Take 1 capsule by mouth daily.    DOCUSATE SODIUM (COLACE) 100 MG CAPSULE    Take 1 capsule (100 mg total) by mouth every 12 (twelve) hours.   FLUTICASONE (FLONASE) 50 MCG/ACT NASAL SPRAY    Place 1 spray into both nostrils daily.    GUAIFENESIN (MUCINEX) 600 MG 12 HR TABLET    Take 600 mg by mouth every 12 (twelve) hours as needed for cough.    LEVOTHYROXINE (SYNTHROID, LEVOTHROID) 100 MCG TABLET    Take 100 mcg by mouth daily before breakfast.   LOSARTAN (COZAAR) 25 MG TABLET    Take 25 mg by mouth daily.   MENTHOL-CETYLPYRIDINIUM (CEPACOL) 3 MG LOZENGE    Take 1 lozenge by mouth every 2 (two)  hours as needed for sore throat.   METOPROLOL TARTRATE (LOPRESSOR) 25 MG TABLET    Take 12.5 mg by mouth at bedtime.    MULTIVITAMIN (THERAGRAN) PER TABLET    Take 1 tablet by mouth daily.     NYSTATIN CREAM (MYCOSTATIN)    Apply topically 2 (two) times daily.   POLYETHYL GLYCOL-PROPYL GLYCOL (SYSTANE) 0.4-0.3 % SOLN    Apply 1 drop to eye daily as needed (dry eyes).   POLYVINYL ALCOHOL (LIQUIFILM TEARS) 1.4 % OPHTHALMIC SOLUTION    Place 1 drop into both eyes 4 (four) times daily as needed for dry eyes.   PREMARIN VAGINAL CREAM    APPLY 2 GRAMS EVERY OTHER NIGHT TO HELP VAGINAL DISCOMFORT   SPIRONOLACTONE (ALDACTONE) 25 MG TABLET    Take one tablet daily   TORSEMIDE (DEMADEX) 20 MG TABLET    Take one tablet daily to help regulate and prevent edema  Modified Medications   No medications on file  Discontinued Medications   MECLIZINE (ANTIVERT) 25 MG TABLET     Take 25 mg by mouth daily as needed for dizziness.    POTASSIUM CHLORIDE SA (K-DUR,KLOR-CON) 20 MEQ TABLET    Take 1 tablet (20 mEq total) by mouth daily.     Review of Systems  Constitutional: Negative.   HENT: Positive for hearing loss and sore throat.   Eyes: Negative.   Respiratory: Positive for cough and shortness of breath.   Cardiovascular: Positive for leg swelling.       Hospitalized May 9242 for acute systolic and diastolic congestive heart failure.  Gastrointestinal: Negative.   Genitourinary:       Stress incontinence.  Musculoskeletal:       Chronic knee pains. Uses a walker.  Skin:       Improved perirectal and labial burning  Neurological: Negative.   Psychiatric/Behavioral:       Depressive symptoms come and go.    Filed Vitals:   02/07/15 1650  BP: 124/60  Pulse: 61  Temp: 97.4 F (36.3 C)  TempSrc: Oral  Height: '5\' 3"'$  (1.6 m)  Weight: 165 lb (74.844 kg)  SpO2: 99%   Wt Readings from Last 3 Encounters:  02/07/15 165 lb (74.844 kg)  11/28/14 162 lb 12.8 oz (73.846 kg)  10/18/14 164 lb (74.39 kg)    Body mass index is 29.24 kg/(m^2).  Physical Exam  Constitutional: She is oriented to person, place, and time. She appears well-developed and well-nourished. No distress.  HENT:  Head: Normocephalic and atraumatic.  Right Ear: External ear normal.  Left Ear: External ear normal.  Nose: Nose normal.  Mouth/Throat: Oropharynx is clear and moist.  Loss of hearing bilaterally.  Eyes: Conjunctivae and EOM are normal. Pupils are equal, round, and reactive to light.  Corrective lenses and prisms on her glasses.  Neck: No JVD present. No tracheal deviation present. No thyromegaly present.  Cardiovascular:  Regular rhythm. Grade 3/6 systolic ejection murmur at left sternal border. No heaves or thrills on palpation. Dorsalis pedis and posterior tibial arteries have diminished pulses bilaterally. Varicose leg veins bilaterally.  Pulmonary/Chest: She has rales.   Coarse rales and wheezes posterior mid to lower lungs, R>L  Abdominal: She exhibits no distension and no mass. There is no tenderness.  Genitourinary:   labial atrophy  Musculoskeletal: She exhibits edema.  Exquisitely tender in the medial side of the right knee. Crepitance in both knees. She is able to use her walker and a cane and walk unassisted. Trace edema  in ankles.   Lymphadenopathy:    She has no cervical adenopathy.  Neurological: She is alert and oriented to person, place, and time. No cranial nerve deficit. Coordination normal.  Reduced vibratory sensation in the right leg and foot.  Skin: No rash noted. No erythema. No pallor.  Psychiatric: She has a normal mood and affect. Her behavior is normal. Judgment and thought content normal.     Labs reviewed: Lab Summary Latest Ref Rng 02/07/2015 01/30/2015 12/07/2014 10/23/2014 09/27/2014  Hemoglobin 12.0 - 16.0 g/dL (None) 12.5 12.1 (None) 11.8(L)  Hematocrit 36 - 46 % (None) 36 36.6 (None) 36.7  White count - (None) 12.7 8.2 (None) 9.6  Platelet count 150 - 399 K/L (None) 227 246 (None) 300  Sodium 137 - 147 mmol/L 136(A) 138 137 138 141  Potassium 3.4 - 5.3 mmol/L 3.7 3.9 4.0 4.2 4.0  Calcium 8.9 - 10.3 mg/dL (None) (None) 9.3 (None) 9.4  Phosphorus - (None) (None) (None) (None) (None)  Creatinine 0.5 - 1.1 mg/dL 1.9(A) 1.6(A) 1.51(H) 1.5(A) 1.37(H)  AST 13 - 35 U/L (None) 23 (None) 19 (None)  Alk Phos 25 - 125 U/L (None) 84 (None) 70 (None)  Bilirubin - (None) (None) (None) (None) (None)  Glucose - 81 107 95 78 113(H)  Cholesterol - (None) (None) (None) (None) (None)  HDL cholesterol - (None) (None) (None) (None) (None)  Triglycerides - (None) (None) (None) (None) (None)  LDL Direct - (None) (None) (None) (None) (None)  LDL Calc - (None) (None) (None) (None) (None)  Total protein - (None) (None) (None) (None) (None)  Albumin - (None) (None) (None) (None) (None)   Lab Results  Component Value Date   TSH 5.54  12/18/2014   Lab Results  Component Value Date   BUN 74* 02/07/2015   BUN 51* 01/30/2015   BUN 61* 12/07/2014   Lab Results  Component Value Date   CREATININE 1.9* 02/07/2015   CREATININE 1.6* 01/30/2015   CREATININE 1.51* 12/07/2014   Lab Results  Component Value Date   HGBA1C 5.4 07/13/2014       Assessment/Plan  1. Chronic kidney disease, stage III (moderate) -CMP, future  2. Essential hypertension -CMP, future  3. Hypothyroidism, unspecified hypothyroidism type -TSH, future  4. Systolic and diastolic CHF, chronic (HCC) Lungs are clear and patient seems compensated at this time.

## 2015-02-14 ENCOUNTER — Inpatient Hospital Stay (HOSPITAL_COMMUNITY)
Admission: EM | Admit: 2015-02-14 | Discharge: 2015-02-22 | DRG: 291 | Disposition: A | Discharge status: Skilled Nursing Facility | Payer: Medicare Other | Attending: Cardiology | Admitting: Cardiology

## 2015-02-14 ENCOUNTER — Encounter (HOSPITAL_COMMUNITY): Payer: Self-pay | Admitting: Emergency Medicine

## 2015-02-14 ENCOUNTER — Encounter: Payer: Self-pay | Admitting: Nurse Practitioner

## 2015-02-14 ENCOUNTER — Non-Acute Institutional Stay: Payer: Medicare Other | Admitting: Nurse Practitioner

## 2015-02-14 ENCOUNTER — Emergency Department (HOSPITAL_COMMUNITY): Payer: Medicare Other

## 2015-02-14 DIAGNOSIS — Z85828 Personal history of other malignant neoplasm of skin: Secondary | ICD-10-CM

## 2015-02-14 DIAGNOSIS — I480 Paroxysmal atrial fibrillation: Secondary | ICD-10-CM | POA: Insufficient documentation

## 2015-02-14 DIAGNOSIS — F411 Generalized anxiety disorder: Secondary | ICD-10-CM | POA: Diagnosis not present

## 2015-02-14 DIAGNOSIS — I10 Essential (primary) hypertension: Secondary | ICD-10-CM | POA: Diagnosis present

## 2015-02-14 DIAGNOSIS — I340 Nonrheumatic mitral (valve) insufficiency: Secondary | ICD-10-CM | POA: Diagnosis present

## 2015-02-14 DIAGNOSIS — R1310 Dysphagia, unspecified: Secondary | ICD-10-CM

## 2015-02-14 DIAGNOSIS — R1312 Dysphagia, oropharyngeal phase: Secondary | ICD-10-CM | POA: Diagnosis present

## 2015-02-14 DIAGNOSIS — I248 Other forms of acute ischemic heart disease: Secondary | ICD-10-CM | POA: Diagnosis present

## 2015-02-14 DIAGNOSIS — Z7982 Long term (current) use of aspirin: Secondary | ICD-10-CM

## 2015-02-14 DIAGNOSIS — K219 Gastro-esophageal reflux disease without esophagitis: Secondary | ICD-10-CM | POA: Diagnosis present

## 2015-02-14 DIAGNOSIS — K2100 Gastro-esophageal reflux disease with esophagitis, without bleeding: Secondary | ICD-10-CM

## 2015-02-14 DIAGNOSIS — M25561 Pain in right knee: Secondary | ICD-10-CM | POA: Diagnosis not present

## 2015-02-14 DIAGNOSIS — G2581 Restless legs syndrome: Secondary | ICD-10-CM | POA: Diagnosis present

## 2015-02-14 DIAGNOSIS — R059 Cough, unspecified: Secondary | ICD-10-CM

## 2015-02-14 DIAGNOSIS — K224 Dyskinesia of esophagus: Secondary | ICD-10-CM | POA: Diagnosis present

## 2015-02-14 DIAGNOSIS — F502 Bulimia nervosa: Secondary | ICD-10-CM | POA: Diagnosis not present

## 2015-02-14 DIAGNOSIS — K5900 Constipation, unspecified: Secondary | ICD-10-CM | POA: Diagnosis not present

## 2015-02-14 DIAGNOSIS — I5042 Chronic combined systolic (congestive) and diastolic (congestive) heart failure: Secondary | ICD-10-CM

## 2015-02-14 DIAGNOSIS — I471 Supraventricular tachycardia: Secondary | ICD-10-CM | POA: Diagnosis present

## 2015-02-14 DIAGNOSIS — N289 Disorder of kidney and ureter, unspecified: Secondary | ICD-10-CM

## 2015-02-14 DIAGNOSIS — Z9842 Cataract extraction status, left eye: Secondary | ICD-10-CM | POA: Diagnosis not present

## 2015-02-14 DIAGNOSIS — I5043 Acute on chronic combined systolic (congestive) and diastolic (congestive) heart failure: Secondary | ICD-10-CM

## 2015-02-14 DIAGNOSIS — N179 Acute kidney failure, unspecified: Secondary | ICD-10-CM

## 2015-02-14 DIAGNOSIS — Z9841 Cataract extraction status, right eye: Secondary | ICD-10-CM

## 2015-02-14 DIAGNOSIS — Z961 Presence of intraocular lens: Secondary | ICD-10-CM | POA: Diagnosis present

## 2015-02-14 DIAGNOSIS — I4891 Unspecified atrial fibrillation: Secondary | ICD-10-CM | POA: Diagnosis not present

## 2015-02-14 DIAGNOSIS — I959 Hypotension, unspecified: Secondary | ICD-10-CM | POA: Diagnosis present

## 2015-02-14 DIAGNOSIS — I481 Persistent atrial fibrillation: Secondary | ICD-10-CM | POA: Diagnosis present

## 2015-02-14 DIAGNOSIS — R34 Anuria and oliguria: Secondary | ICD-10-CM | POA: Diagnosis not present

## 2015-02-14 DIAGNOSIS — R112 Nausea with vomiting, unspecified: Secondary | ICD-10-CM | POA: Insufficient documentation

## 2015-02-14 DIAGNOSIS — N184 Chronic kidney disease, stage 4 (severe): Secondary | ICD-10-CM | POA: Diagnosis present

## 2015-02-14 DIAGNOSIS — R05 Cough: Secondary | ICD-10-CM

## 2015-02-14 DIAGNOSIS — E039 Hypothyroidism, unspecified: Secondary | ICD-10-CM | POA: Diagnosis present

## 2015-02-14 DIAGNOSIS — N183 Chronic kidney disease, stage 3 unspecified: Secondary | ICD-10-CM

## 2015-02-14 DIAGNOSIS — H3530 Unspecified macular degeneration: Secondary | ICD-10-CM | POA: Diagnosis present

## 2015-02-14 DIAGNOSIS — I130 Hypertensive heart and chronic kidney disease with heart failure and stage 1 through stage 4 chronic kidney disease, or unspecified chronic kidney disease: Principal | ICD-10-CM | POA: Diagnosis present

## 2015-02-14 DIAGNOSIS — Z7901 Long term (current) use of anticoagulants: Secondary | ICD-10-CM

## 2015-02-14 DIAGNOSIS — N189 Chronic kidney disease, unspecified: Secondary | ICD-10-CM

## 2015-02-14 DIAGNOSIS — I509 Heart failure, unspecified: Secondary | ICD-10-CM

## 2015-02-14 DIAGNOSIS — I255 Ischemic cardiomyopathy: Secondary | ICD-10-CM | POA: Diagnosis present

## 2015-02-14 DIAGNOSIS — N170 Acute kidney failure with tubular necrosis: Secondary | ICD-10-CM | POA: Diagnosis not present

## 2015-02-14 LAB — COMPREHENSIVE METABOLIC PANEL
ALT: 20 U/L (ref 14–54)
AST: 29 U/L (ref 15–41)
Albumin: 3.2 g/dL — ABNORMAL LOW (ref 3.5–5.0)
Alkaline Phosphatase: 91 U/L (ref 38–126)
Anion gap: 17 — ABNORMAL HIGH (ref 5–15)
BUN: 76 mg/dL — ABNORMAL HIGH (ref 6–20)
CHLORIDE: 102 mmol/L (ref 101–111)
CO2: 18 mmol/L — ABNORMAL LOW (ref 22–32)
Calcium: 9.5 mg/dL (ref 8.9–10.3)
Creatinine, Ser: 2.08 mg/dL — ABNORMAL HIGH (ref 0.44–1.00)
GFR, EST AFRICAN AMERICAN: 22 mL/min — AB (ref >60)
GFR, EST NON AFRICAN AMERICAN: 19 mL/min — AB (ref >60)
Glucose, Bld: 100 mg/dL — ABNORMAL HIGH (ref 65–99)
POTASSIUM: 3.5 mmol/L (ref 3.5–5.1)
SODIUM: 137 mmol/L (ref 135–145)
Total Bilirubin: 0.9 mg/dL (ref 0.3–1.2)
Total Protein: 6.4 g/dL — ABNORMAL LOW (ref 6.5–8.1)

## 2015-02-14 LAB — CBC WITH DIFFERENTIAL/PLATELET
BASOS ABS: 0 10*3/uL (ref 0.0–0.1)
Basophils Relative: 0 %
EOS ABS: 0 10*3/uL (ref 0.0–0.7)
EOS PCT: 0 %
HCT: 34.2 % — ABNORMAL LOW (ref 36.0–46.0)
Hemoglobin: 11.8 g/dL — ABNORMAL LOW (ref 12.0–15.0)
LYMPHS PCT: 14 %
Lymphs Abs: 1.1 10*3/uL (ref 0.7–4.0)
MCH: 34.1 pg — ABNORMAL HIGH (ref 26.0–34.0)
MCHC: 34.5 g/dL (ref 30.0–36.0)
MCV: 98.8 fL (ref 78.0–100.0)
MONO ABS: 1.2 10*3/uL — AB (ref 0.1–1.0)
Monocytes Relative: 16 %
Neutro Abs: 5.3 10*3/uL (ref 1.7–7.7)
Neutrophils Relative %: 70 %
PLATELETS: 266 10*3/uL (ref 150–400)
RBC: 3.46 MIL/uL — ABNORMAL LOW (ref 3.87–5.11)
RDW: 15.1 % (ref 11.5–15.5)
WBC: 7.7 10*3/uL (ref 4.0–10.5)

## 2015-02-14 LAB — CBC AND DIFFERENTIAL
HEMATOCRIT: 34 % — AB (ref 36–46)
Hemoglobin: 11.7 g/dL — AB (ref 12.0–16.0)
PLATELETS: 282 10*3/uL (ref 150–399)
WBC: 9.4 10^3/mL

## 2015-02-14 LAB — HEPATIC FUNCTION PANEL
ALT: 17 U/L (ref 7–35)
AST: 23 U/L (ref 13–35)
Alkaline Phosphatase: 95 U/L (ref 25–125)
BILIRUBIN, TOTAL: 0.9 mg/dL

## 2015-02-14 LAB — I-STAT TROPONIN, ED: TROPONIN I, POC: 0.06 ng/mL (ref 0.00–0.08)

## 2015-02-14 LAB — BASIC METABOLIC PANEL
BUN: 72 mg/dL — AB (ref 4–21)
CREATININE: 1.9 mg/dL — AB (ref 0.5–1.1)
GLUCOSE: 112 mg/dL
Potassium: 4.2 mmol/L (ref 3.4–5.3)
SODIUM: 139 mmol/L (ref 137–147)

## 2015-02-14 LAB — BRAIN NATRIURETIC PEPTIDE: B Natriuretic Peptide: 630.9 pg/mL — ABNORMAL HIGH (ref 0.0–100.0)

## 2015-02-14 LAB — TSH: TSH: 4.5 u[IU]/mL (ref 0.41–5.90)

## 2015-02-14 LAB — CBG MONITORING, ED: Glucose-Capillary: 88 mg/dL (ref 65–99)

## 2015-02-14 LAB — MAGNESIUM: MAGNESIUM: 2.3 mg/dL (ref 1.7–2.4)

## 2015-02-14 LAB — TROPONIN I: TROPONIN I: 0.08 ng/mL — AB (ref <0.031)

## 2015-02-14 MED ORDER — DILTIAZEM LOAD VIA INFUSION
10.0000 mg | Freq: Once | INTRAVENOUS | Status: AC
  Administered 2015-02-14: 10 mg via INTRAVENOUS
  Filled 2015-02-14: qty 10

## 2015-02-14 MED ORDER — VITAMIN D 1000 UNITS PO TABS
1000.0000 [IU] | ORAL_TABLET | Freq: Every day | ORAL | Status: DC
  Administered 2015-02-15 – 2015-02-20 (×6): 1000 [IU] via ORAL
  Filled 2015-02-14 (×6): qty 1

## 2015-02-14 MED ORDER — ADULT MULTIVITAMIN W/MINERALS CH
1.0000 | ORAL_TABLET | Freq: Every day | ORAL | Status: DC
  Administered 2015-02-15 – 2015-02-20 (×6): 1 via ORAL
  Filled 2015-02-14 (×7): qty 1

## 2015-02-14 MED ORDER — NITROGLYCERIN 0.4 MG SL SUBL: 0.4000 mg | SUBLINGUAL_TABLET | SUBLINGUAL | Status: DC | PRN

## 2015-02-14 MED ORDER — SODIUM CHLORIDE 0.9 % IV SOLN: 250.0000 mL | INTRAVENOUS | Status: DC | PRN

## 2015-02-14 MED ORDER — ACETAMINOPHEN 500 MG PO TABS
500.0000 mg | ORAL_TABLET | Freq: Three times a day (TID) | ORAL | Status: DC
  Administered 2015-02-15 – 2015-02-22 (×23): 500 mg via ORAL
  Filled 2015-02-14 (×23): qty 1

## 2015-02-14 MED ORDER — BIOTENE MOISTURIZING MOUTH MT SOLN: 1.0000 | OROMUCOSAL | Status: DC | PRN

## 2015-02-14 MED ORDER — AMIODARONE HCL IN DEXTROSE 360-4.14 MG/200ML-% IV SOLN
60.0000 mg/h | INTRAVENOUS | Status: DC
  Administered 2015-02-14 – 2015-02-15 (×2): 60 mg/h via INTRAVENOUS
  Filled 2015-02-14 (×2): qty 200

## 2015-02-14 MED ORDER — SODIUM CHLORIDE 0.9% FLUSH: 3.0000 mL | INTRAVENOUS | Status: DC | PRN

## 2015-02-14 MED ORDER — TORSEMIDE 20 MG PO TABS
20.0000 mg | ORAL_TABLET | Freq: Two times a day (BID) | ORAL | Status: DC
  Administered 2015-02-15 – 2015-02-16 (×3): 20 mg via ORAL
  Filled 2015-02-14 (×5): qty 1

## 2015-02-14 MED ORDER — DILTIAZEM HCL 100 MG IV SOLR
5.0000 mg/h | INTRAVENOUS | Status: DC
  Administered 2015-02-14: 5 mg/h via INTRAVENOUS

## 2015-02-14 MED ORDER — FAMOTIDINE 20 MG PO TABS
20.0000 mg | ORAL_TABLET | Freq: Two times a day (BID) | ORAL | Status: DC
Start: 2015-02-14 — End: 2015-02-15
  Administered 2015-02-15 (×2): 20 mg via ORAL
  Filled 2015-02-14 (×2): qty 1

## 2015-02-14 MED ORDER — FUROSEMIDE 10 MG/ML IJ SOLN: 40.0000 mg | Freq: Once | INTRAMUSCULAR | Status: DC

## 2015-02-14 MED ORDER — LEVOTHYROXINE SODIUM 100 MCG PO TABS
100.0000 ug | ORAL_TABLET | Freq: Every day | ORAL | Status: DC
  Administered 2015-02-15 – 2015-02-18 (×4): 100 ug via ORAL
  Filled 2015-02-14 (×4): qty 1

## 2015-02-14 MED ORDER — BIOTENE DRY MOUTH MT LIQD: 15.0000 mL | OROMUCOSAL | Status: DC | PRN

## 2015-02-14 MED ORDER — METOPROLOL TARTRATE 25 MG PO TABS: 50.0000 mg | ORAL_TABLET | Freq: Two times a day (BID) | ORAL | Status: DC

## 2015-02-14 MED ORDER — DILTIAZEM LOAD VIA INFUSION
20.0000 mg | Freq: Once | INTRAVENOUS | Status: AC
  Administered 2015-02-14: 20 mg via INTRAVENOUS

## 2015-02-14 MED ORDER — DEXTROSE 5 % IV SOLN
5.0000 mg/h | Freq: Once | INTRAVENOUS | Status: DC
  Filled 2015-02-14: qty 100

## 2015-02-14 MED ORDER — MENTHOL 3 MG MT LOZG
1.0000 | LOZENGE | OROMUCOSAL | Status: DC | PRN
  Filled 2015-02-14: qty 9

## 2015-02-14 MED ORDER — AMIODARONE HCL IN DEXTROSE 360-4.14 MG/200ML-% IV SOLN
30.0000 mg/h | INTRAVENOUS | Status: DC
  Administered 2015-02-15 – 2015-02-17 (×5): 30 mg/h via INTRAVENOUS
  Filled 2015-02-14 (×8): qty 200

## 2015-02-14 MED ORDER — POLYVINYL ALCOHOL 1.4 % OP SOLN
1.0000 [drp] | Freq: Four times a day (QID) | OPHTHALMIC | Status: DC | PRN
  Filled 2015-02-14: qty 15

## 2015-02-14 MED ORDER — CALCIUM CARBONATE-VITAMIN D 500-200 MG-UNIT PO TABS
1.0000 | ORAL_TABLET | Freq: Every day | ORAL | Status: DC
  Administered 2015-02-15 – 2015-02-20 (×6): 1 via ORAL
  Filled 2015-02-14 (×7): qty 1

## 2015-02-14 MED ORDER — SPIRONOLACTONE 25 MG PO TABS: 25.0000 mg | ORAL_TABLET | Freq: Every day | ORAL | Status: DC

## 2015-02-14 MED ORDER — ZOLPIDEM TARTRATE 5 MG PO TABS
5.0000 mg | ORAL_TABLET | Freq: Every evening | ORAL | Status: DC | PRN
  Administered 2015-02-16: 5 mg via ORAL
  Filled 2015-02-14: qty 1

## 2015-02-14 MED ORDER — AMIODARONE LOAD VIA INFUSION
150.0000 mg | Freq: Once | INTRAVENOUS | Status: AC
  Administered 2015-02-14: 150 mg via INTRAVENOUS
  Filled 2015-02-14: qty 83.34

## 2015-02-14 MED ORDER — FUROSEMIDE 10 MG/ML IJ SOLN
80.0000 mg | Freq: Once | INTRAMUSCULAR | Status: AC
  Administered 2015-02-14: 80 mg via INTRAVENOUS
  Filled 2015-02-14: qty 8

## 2015-02-14 MED ORDER — ALPRAZOLAM 0.25 MG PO TABS
0.2500 mg | ORAL_TABLET | Freq: Two times a day (BID) | ORAL | Status: DC | PRN
  Administered 2015-02-17 – 2015-02-21 (×2): 0.25 mg via ORAL
  Filled 2015-02-14 (×3): qty 1

## 2015-02-14 MED ORDER — SODIUM CHLORIDE 0.9% FLUSH
3.0000 mL | Freq: Two times a day (BID) | INTRAVENOUS | Status: DC
  Administered 2015-02-15 – 2015-02-22 (×12): 3 mL via INTRAVENOUS

## 2015-02-14 MED ORDER — LORATADINE 10 MG PO TABS
10.0000 mg | ORAL_TABLET | Freq: Every day | ORAL | Status: DC
  Administered 2015-02-15 – 2015-02-22 (×8): 10 mg via ORAL
  Filled 2015-02-14 (×8): qty 1

## 2015-02-14 MED ORDER — ONDANSETRON HCL 4 MG/2ML IJ SOLN: 4.0000 mg | Freq: Four times a day (QID) | INTRAMUSCULAR | Status: DC | PRN

## 2015-02-14 MED ORDER — FLUTICASONE PROPIONATE 50 MCG/ACT NA SUSP
1.0000 | Freq: Every day | NASAL | Status: DC | PRN
  Filled 2015-02-14: qty 16

## 2015-02-14 MED ORDER — APIXABAN 2.5 MG PO TABS
2.5000 mg | ORAL_TABLET | Freq: Two times a day (BID) | ORAL | Status: DC
  Administered 2015-02-15 – 2015-02-20 (×12): 2.5 mg via ORAL
  Filled 2015-02-14 (×13): qty 1

## 2015-02-14 MED ORDER — ACETAMINOPHEN 325 MG PO TABS: 650.0000 mg | ORAL_TABLET | ORAL | Status: DC | PRN

## 2015-02-14 MED ORDER — DOCUSATE SODIUM 100 MG PO CAPS
100.0000 mg | ORAL_CAPSULE | Freq: Two times a day (BID) | ORAL | Status: DC
  Administered 2015-02-15 – 2015-02-22 (×13): 100 mg via ORAL
  Filled 2015-02-14 (×13): qty 1

## 2015-02-14 NOTE — ED Notes (Signed)
Attempted report x 2 

## 2015-02-14 NOTE — ED Notes (Signed)
Pt's daughter states she threw up at the dining room table last night, but has a hx of esophageal strictures

## 2015-02-14 NOTE — Assessment & Plan Note (Addendum)
02/14/15 HR 140, irregular, new onset ED to eval and treat. POA wants to delay ED eval, desires in facility CBC, CMP, BNP, EKG, will increase Metoprolol 50mg  bid, dc losartan for now, VS q shift.

## 2015-02-14 NOTE — Assessment & Plan Note (Addendum)
Controlled, Hold Torsemide 20mg  2nd to creat 1.9 02/07/15, dc Losartain 25mg , increase Metoprolol 50 mg bid due to HR 140

## 2015-02-14 NOTE — Assessment & Plan Note (Signed)
10/23/14 TSH 6.749, increase Levothyroxine to 134mcg daily 12/18/14 TSH 5.544, TSH 6 wks

## 2015-02-14 NOTE — ED Notes (Signed)
Per eMS:  Pt here from friend's home where she has been experiencing tachycardia since this am.  Pt does not have any complaints at this time, except she is recovering from bronchitis with some yellow phlegm still being coughed up.  Pt has a hx of CHF.  No hx of afib.  Has had recent decreased appetite, decrease intake.  Denies any pain.  Received 160ml of NaCl en route.

## 2015-02-14 NOTE — Assessment & Plan Note (Signed)
Chronic, Diclofenac gel to the area, activity as tolerated. Tylenol tid and prn available to her

## 2015-02-14 NOTE — Progress Notes (Signed)
Patient ID: Ann [name], female   DOB: Oct 01, 1918, 82 y.o.   MRN: [id number]  Location:  [facility] Provider:  Marlana Latus NP  Code Status:  DNR Goals of care: Advanced Directive information Does patient have an advance directive?: Yes, Type of Advance Directive: Out of facility DNR (pink MOST or yellow form);Healthcare Power of Geographical information systems officer Complaint  Patient presents with  . Medical Management of Chronic Issues    vomiting     HPI: Patient is a 82 y.o. female seen in the [facility] at [facility] today for evaluation of  vomiting, HR 140, generalized weakness, denied chest or abd pain, no O2 desaturation, denied palpitation, dysuria, afebrile. POA requested to have labs and EKG done in facility. Hx of HTN, GERD,  thyroid, CHF   Review of Systems:  Review of Systems  HENT: Positive for hearing loss and sore throat.   Eyes: Negative.   Respiratory: Positive for cough and shortness of breath.   Cardiovascular: Positive for leg swelling.       Hospitalized May [id number] for acute systolic and diastolic congestive heart failure.  Gastrointestinal: Positive for vomiting.  Genitourinary:       Stress incontinence.  Musculoskeletal:       Chronic knee pains. Uses a walker.  Skin:       Improved perirectal and labial burning  Neurological: Positive for weakness.  Psychiatric/Behavioral:       Depressive symptoms come and go.    Past Medical History  Diagnosis Date  . Mitral regurgitation   . Hypertension   . Palpitations   . Nocturnal dyspnea   . Advanced age   . Chronic diastolic CHF (congestive heart failure) ([place])     Last echo in 5/11  . Pain in joint, lower leg 02/25/2012  . Pain in joint, lower leg 02/23/2012  . Encounter for long-term (current) use of other medications 10/02/2011  . Cough 07/17/2011  . Internal hemorrhoids without mention of complication [id number]  . Unspecified constipation 01/01/2011  . Rash and other nonspecific skin eruption 08/14/2010  . Tension  headache 07/31/2010  . Benign paroxysmal positional vertigo 07/24/2010  . Cervicalgia 07/24/2010  . Urinary tract infection, site not specified 07/10/2010  . Abnormality of gait 05/09/2009  . Vitamin D deficiency 01/25/2009  . Lumbago 01/05/2008  . Cervicitis and endocervicitis 12/22/2007  . Actinic keratosis 12/22/2007  . Pain in limb 12/22/2007  . Muscle weakness (generalized) 08/04/2007  . Blepharochalasis 05/12/2007  . Insomnia, unspecified 05/12/2007  . Dermatophytosis of foot 07/14/2006  . Dizziness and giddiness 06/25/2006  . Allergic rhinitis due to pollen 04/21/2006  . Dyskinesia of esophagus 04/12/2006  . Other malaise and fatigue 04/01/2006  . Unspecified urinary incontinence 02/10/2006  . Cramp of limb 10/07/2005  . Restless legs syndrome (RLS) 06/30/2005  . CKD (chronic kidney disease), stage III   . Hypothyroidism   . Diaphragmatic hernia without mention of obstruction or gangrene 10/03/2003  . Unspecified venous (peripheral) insufficiency 01/05/1998  . Reflux esophagitis 01/06/1996  . Peptic ulcer, unspecified site, unspecified as acute or chronic, without mention of hemorrhage, perforation, or obstruction 01/06/1996  . Diverticulosis of colon (without mention of hemorrhage) 01/06/1996  . Pain in right knee 08/08/2012  . Macular degeneration of right eye 1980's  . Macular degeneration 09/21/2013  . Varicose vein of leg 02/08/2014  . Mitral valve regurgitation     Moderate-severe by echo 05/2014  . Systolic and diastolic CHF, chronic ([place])     Patient Active  Problem List   Diagnosis Date Noted  . Paroxysmal supraventricular tachycardia ([place]) 02/14/2015  . Nausea with vomiting 02/14/2015  . Constipation 10/24/2014  . Musculoskeletal chest pain 09/27/2014  . Generalized anxiety disorder 07/16/2014  . Right knee pain 06/14/2014  . Dyspnea 06/14/2014  . Systolic and diastolic CHF, chronic ([place]) 05/27/2014  . Mitral valve regurgitation 05/26/2014  . Bunion of great toe 02/08/2014  . Varicose vein  of leg 02/08/2014  . Macular degeneration 09/21/2013  . Atrophic vaginitis 07/13/2013  . Urinary incontinence 09/22/2012  . Hypertension   . Diplopia 09/12/2012  . Chronic kidney disease, stage III (moderate) 08/08/2012  . PVC's (premature ventricular contractions) 09/08/2011  . Hypothyroidism 04/17/2010  . Spinal stenosis, lumbar 04/17/2010  . Abnormality of gait 05/09/2009  . Reflux esophagitis 01/06/1996  . Peptic ulcer, unspecified site, unspecified as acute or chronic, without mention of hemorrhage, perforation, or obstruction 01/06/1996    Allergies  Allergen Reactions  . Protonix [Pantoprazole Sodium] Rash  . Ace Inhibitors Cough  . Amoxicillin Diarrhea  . Biaxin [Clarithromycin]     Noted on info sheet from independent living  . Codeine   . Darvon   . Ibuprofen Other (See Comments)    Noted on info sheet from independent living  . Mercurochrome [Merbromin (Mercurochrome)]   . Pantoprazole     Dizzy  . Prilosec [Omeprazole] Other (See Comments)    Noted on info sheet from independent living  . Septra [Sulfamethoxazole-Trimethoprim]   . Sulfa Drugs Cross Reactors   . Tramadol     Abnormal kidney function  . Vesicare [Solifenacin]     Drys out nose and throat     Medications: Patient's Medications  New Prescriptions   No medications on file  Previous Medications   ACETAMINOPHEN (TYLENOL) 500 MG TABLET    Take 1 tablet (500 mg total) by mouth every 4 (four) hours as needed for mild pain or moderate pain.   ARTIFICIAL SALIVA (BIOTENE MOISTURIZING MOUTH MT)    Use as directed in the mouth or throat. Use as directed   ASPIRIN EC 81 MG TABLET    Take 81 mg by mouth daily.   CALCIUM & MAGNESIUM CARBONATES (MYLANTA PO)    Take 30 mLs by mouth 4 (four) times daily as needed (indigestion).    CALCIUM CARBONATE ANTACID 400 MG CHEW    Chew 800 mg by mouth every 4 (four) hours as needed (heartburn).   CETIRIZINE (ZYRTEC) 10 MG TABLET    Take 5 mg by mouth daily.     CHOLECALCIFEROL (VITAMIN D3) 1000 UNITS CAPS    Take 1 capsule by mouth daily.    DOCUSATE SODIUM (COLACE) 100 MG CAPSULE    Take 1 capsule (100 mg total) by mouth every 12 (twelve) hours.   FLUTICASONE (FLONASE) 50 MCG/ACT NASAL SPRAY    Place 1 spray into both nostrils daily.    LEVOTHYROXINE (SYNTHROID, LEVOTHROID) 100 MCG TABLET    Take 100 mcg by mouth daily before breakfast.   LOSARTAN (COZAAR) 25 MG TABLET    Take 25 mg by mouth daily.   MENTHOL-CETYLPYRIDINIUM (CEPACOL) 3 MG LOZENGE    Take 1 lozenge by mouth every 2 (two) hours as needed for sore throat.   METOPROLOL TARTRATE (LOPRESSOR) 25 MG TABLET    Take 12.5 mg by mouth at bedtime.    MULTIVITAMIN (THERAGRAN) PER TABLET    Take 1 tablet by mouth daily.     POLYETHYL GLYCOL-PROPYL GLYCOL (SYSTANE) 0.4-0.3 % SOLN  Apply 1 drop to eye daily as needed (dry eyes).   POLYVINYL ALCOHOL (LIQUIFILM TEARS) 1.4 % OPHTHALMIC SOLUTION    Place 1 drop into both eyes 4 (four) times daily as needed for dry eyes.   POTASSIUM CHLORIDE SA (K-DUR,KLOR-CON) 20 MEQ TABLET    Take 20 mEq by mouth daily.   TORSEMIDE (DEMADEX) 20 MG TABLET    Take one tablet daily to help regulate and prevent edema  Modified Medications   No medications on file  Discontinued Medications   ALUM & MAG HYDROXIDE-SIMETH (MAALOX PLUS) 400-400-40 MG/5ML SUSPENSION    Take 30 mLs by mouth 4 (four) times daily as needed (heartburn).   BENZONATATE (TESSALON) 100 MG CAPSULE    Take 1 capsule (100 mg total) by mouth 3 (three) times daily as needed for cough.   CALCIUM-VITAMIN D (OSCAL WITH D) 500-200 MG-UNIT PER TABLET    Take 1 tablet by mouth daily with breakfast.   GUAIFENESIN (MUCINEX) 600 MG 12 HR TABLET    Take 600 mg by mouth every 12 (twelve) hours as needed for cough.    NYSTATIN CREAM (MYCOSTATIN)    Apply topically 2 (two) times daily.   PREMARIN VAGINAL CREAM    APPLY 2 GRAMS EVERY OTHER NIGHT TO HELP VAGINAL DISCOMFORT   SPIRONOLACTONE (ALDACTONE) 25 MG TABLET     Reported on 02/14/2015    Physical Exam: There were no vitals filed for this visit. There is no weight on file to calculate BMI.  Physical Exam  Constitutional: She is oriented to person, place, and time. She appears well-developed and well-nourished. No distress.  HENT:  Head: Normocephalic and atraumatic.  Right Ear: External ear normal.  Left Ear: External ear normal.  Nose: Nose normal.  Mouth/Throat: Oropharynx is clear and moist.  Loss of hearing bilaterally.  Eyes: Conjunctivae and EOM are normal. Pupils are equal, round, and reactive to light.  Corrective lenses and prisms on her glasses.  Neck: No JVD present. No tracheal deviation present. No thyromegaly present.  Cardiovascular:  Irregular rhythm. Grade 3/6 systolic ejection murmur at left sternal border. No heaves or thrills on palpation. Dorsalis pedis and posterior tibial arteries have diminished pulses bilaterally. Varicose leg veins bilaterally. EKG 02/14/15 no P waves, HR 149  Pulmonary/Chest: She has rales.  Coarse rales and wheezes posterior mid to lower lungs, R>L  Abdominal: She exhibits no distension and no mass. There is no tenderness.  Genitourinary:   labial atrophy  Musculoskeletal: She exhibits edema.  Exquisitely tender in the medial side of the right knee. Crepitance in both knees. She is able to use her walker and a cane and walk unassisted. Trace edema in ankles.   Lymphadenopathy:    She has no cervical adenopathy.  Neurological: She is alert and oriented to person, place, and time. No cranial nerve deficit. Coordination normal.  Reduced vibratory sensation in the right leg and foot.  Skin: No rash noted. No erythema. No pallor.  Psychiatric: She has a normal mood and affect. Her behavior is normal. Judgment and thought content normal.    Labs reviewed: Basic Metabolic Panel:  Recent Labs  05/29/14 1433  09/27/14 1028  12/07/14 1908 01/30/15 02/07/15  NA 134*  < > 141  < > 137 138 136*  K  4.6  < > 4.0  < > 4.0 3.9 3.7  CL 102  --  107  --  107  --   --   CO2 25  --  26  --  22  --   --   GLUCOSE 87  --  113*  --  95  --   --   BUN 57*  < > 46*  < > 61* 51* 74*  CREATININE 1.86*  < > 1.37*  < > 1.51* 1.6* 1.9*  CALCIUM 9.1  --  9.4  --  9.3  --   --   < > = values in this interval not displayed.  Liver Function Tests:  Recent Labs  05/24/14 0326  07/13/14 10/23/14 01/30/15  AST 30  < > 21 19 23   ALT 18  < > 14 10 11   ALKPHOS 103  < > 118 70 84  BILITOT 0.8  --   --   --   --   PROT 7.1  --   --   --   --   ALBUMIN 3.9  --   --   --   --   < > = values in this interval not displayed.  CBC:  Recent Labs  05/24/14 0326  09/27/14 1028 12/07/14 1908 01/30/15  WBC 8.5  < > 9.6 8.2 12.7  NEUTROABS 6.3  --   --  5.0  --   HGB 12.5  < > 11.8* 12.1 12.5  HCT 37.0  < > 36.7 36.6 36  MCV 101.4*  --  98.4 98.4  --   PLT 267  < > 300 246 227  < > = values in this interval not displayed.  Lab Results  Component Value Date   TSH 5.54 12/18/2014   Lab Results  Component Value Date   HGBA1C 5.4 07/13/2014   Lab Results  Component Value Date   CHOL 174 06/07/2012   HDL 65 06/07/2012   LDLCALC 89 06/07/2012   TRIG 98 06/07/2012    Significant Diagnostic Results since last visit: none  Patient Care Team: Estill Dooms, MD as PCP - General (Internal Medicine) Darlin Coco, MD as Consulting Physician (Cardiology) [facility]  Assessment/Plan Problem List Items Addressed This Visit    Hypothyroidism (Chronic)    10/23/14 TSH 6.749, increase Levothyroxine to 168mcg daily 12/18/14 TSH 5.544, TSH 6 wks       Chronic kidney disease, stage III (moderate) (Chronic)    07/13/14 Bun 52, creatinine 1.44 02/07/15 Bun 74, creatinine 1.9 Hold Torsemide. Obtain CMP      Hypertension (Chronic)    Controlled, Hold Torsemide 20mg  2nd to creat 1.9 02/07/15, dc Losartain 25mg , increase Metoprolol 50 mg bid due to HR 140      Reflux esophagitis (Chronic)     Ranitidine 150mg  bid po, c/o vomited, denied nausea.       Systolic and diastolic CHF, chronic ([facility]) (Chronic)    BNP 363.7 10/23/14, Torsemide 20mg  since 10/18/14, continue to observe for s/s of CHF decompensation. CMP, BNP      Right knee pain (Chronic)    Chronic, Diclofenac gel to the area, activity as tolerated. Tylenol tid and prn available to her      Generalized anxiety disorder    Prn Diazepam q6h prn available to her for anxiety and muscle spasm.       Constipation    Stable, continue Colace bid.       Paroxysmal supraventricular tachycardia ([place]) - Primary    02/14/15 HR 140, irregular, new onset ED to eval and treat. POA wants to delay ED eval, desires in facility CBC, CMP, BNP, EKG, will increase Metoprolol 50mg  bid, dc losartan for  now, VS q shift.       Nausea with vomiting       Family/ staff Communication: ED eval is no better.   Labs/tests ordered:  CBC, CMP, BNP, TSH, amylase, lipase.   [facility] Hallel Denherder NP Geriatrics [facility] [phone], [place] [address] On Call:  [phone] & follow prompts after 5pm & weekends Office Phone:  [phone] Office Fax:  [phone]

## 2015-02-14 NOTE — Assessment & Plan Note (Signed)
Prn Diazepam q6h prn available to her for anxiety and muscle spasm.  

## 2015-02-14 NOTE — Assessment & Plan Note (Signed)
Stable, continue Colace bid.  

## 2015-02-14 NOTE — ED Notes (Signed)
Attempted report x1. 

## 2015-02-14 NOTE — Consult Note (Signed)
ANTICOAGULATION CONSULT NOTE - Initial Consult  Pharmacy Consult for Apixaban Indication: atrial fibrillation  Vital Signs: Temp: 99.1 F (37.3 C) (02/09 1809) Temp Source: Rectal (02/09 1809) BP: 103/56 mmHg (02/09 2045) Pulse Rate: 71 (02/09 2045)  Labs:  Recent Labs  02/14/15 1821  HGB 11.8*  HCT 34.2*  PLT 266  CREATININE 2.08*    Estimated Creatinine Clearance: 16 mL/min (by C-G formula based on Cr of 2.08).   Assessment: 82 yo female w/ new diagnosis of afib starting on Eliquis. No previous anticoag.    Goal of Therapy:  Monitor platelets by anticoagulation protocol: Yes   Plan:  Apixaban 2.5 mg PO BID (age > 37 YO and SCr > 1.5) Monitor CBC, s/sx bleeding  Joya San, PharmD Clinical Pharmacy Resident Pager # [phone] 02/14/2015 9:09 PM

## 2015-02-14 NOTE — Assessment & Plan Note (Signed)
Ranitidine 150mg  bid po, c/o vomited, denied nausea.

## 2015-02-14 NOTE — Assessment & Plan Note (Addendum)
BNP 363.7 10/23/14, Torsemide 20mg  since 10/18/14, continue to observe for s/s of CHF decompensation. CMP, BNP

## 2015-02-14 NOTE — Assessment & Plan Note (Addendum)
07/13/14 Bun 52, creatinine 1.44 02/07/15 Bun 74, creatinine 1.9 Hold Torsemide. Obtain CMP

## 2015-02-14 NOTE — ED Provider Notes (Signed)
CSN: [id number]     Arrival date & time 02/14/15  1731 History   First MD Initiated Contact with Patient 02/14/15 1751     Chief Complaint  Patient presents with  . Tachycardia     (Consider location/radiation/quality/duration/timing/severity/associated sxs/prior Treatment) Patient is a 82 y.o. female presenting with weakness. The history is provided by the patient and a relative.  Weakness This is a new problem. The current episode started 12 to 24 hours ago. The problem occurs constantly. The problem has not changed since onset.Pertinent negatives include no chest pain, no abdominal pain and no shortness of breath. Associated symptoms comments: palpitations. Nothing aggravates the symptoms. Nothing relieves the symptoms. She has tried nothing for the symptoms.    Past Medical History  Diagnosis Date  . Mitral regurgitation   . Hypertension   . Palpitations   . Nocturnal dyspnea   . Advanced age   . Chronic diastolic CHF (congestive heart failure) ([place])     Last echo in 5/11  . Pain in joint, lower leg 02/25/2012  . Pain in joint, lower leg 02/23/2012  . Encounter for long-term (current) use of other medications 10/02/2011  . Cough 07/17/2011  . Internal hemorrhoids without mention of complication [id number]  . Unspecified constipation 01/01/2011  . Rash and other nonspecific skin eruption 08/14/2010  . Tension headache 07/31/2010  . Benign paroxysmal positional vertigo 07/24/2010  . Cervicalgia 07/24/2010  . Urinary tract infection, site not specified 07/10/2010  . Abnormality of gait 05/09/2009  . Vitamin D deficiency 01/25/2009  . Lumbago 01/05/2008  . Cervicitis and endocervicitis 12/22/2007  . Actinic keratosis 12/22/2007  . Pain in limb 12/22/2007  . Muscle weakness (generalized) 08/04/2007  . Blepharochalasis 05/12/2007  . Insomnia, unspecified 05/12/2007  . Dermatophytosis of foot 07/14/2006  . Dizziness and giddiness 06/25/2006  . Allergic rhinitis due to pollen 04/21/2006  .  Dyskinesia of esophagus 04/12/2006  . Other malaise and fatigue 04/01/2006  . Unspecified urinary incontinence 02/10/2006  . Cramp of limb 10/07/2005  . Restless legs syndrome (RLS) 06/30/2005  . CKD (chronic kidney disease), stage III   . Hypothyroidism   . Diaphragmatic hernia without mention of obstruction or gangrene 10/03/2003  . Unspecified venous (peripheral) insufficiency 01/05/1998  . Reflux esophagitis 01/06/1996  . Peptic ulcer, unspecified site, unspecified as acute or chronic, without mention of hemorrhage, perforation, or obstruction 01/06/1996  . Diverticulosis of colon (without mention of hemorrhage) 01/06/1996  . Pain in right knee 08/08/2012  . Macular degeneration of right eye 1980's  . Macular degeneration 09/21/2013  . Varicose vein of leg 02/08/2014  . Mitral valve regurgitation     Moderate-severe by echo 05/2014  . Systolic and diastolic CHF, chronic ([place])    Past Surgical History  Procedure Laterality Date  . Tonsillectomy and adenoidectomy  1940  . Appendectomy  1940's  . Oophorectomy  1940's  . Breast surgery  1950's    benign tumors removed  . Cataract extraction w/ intraocular lens  implant, bilateral  1982  . Lesion excision  04/2002    melanomas of scalp  Dr. Link Snuffer  . Skin cancer excision  2005    BCC of right temple  . Cholecystectomy, laparoscopic  10/2004    Dr. Deon Pilling  . Tooth extraction  06/2008    and bone implant  Dr. Matilde Haymaker  . Colonoscopy  2000   Family History  Problem Relation Age of Onset  . Stroke Mother   . Heart disease Father   .  Diabetes Father   . Heart disease Brother     MI  . Alcohol abuse Sister    Social History  Substance Use Topics  . Smoking status: Never Smoker   . Smokeless tobacco: Never Used  . Alcohol Use: No   OB History    No data available     Review of Systems  Respiratory: Negative for shortness of breath.   Cardiovascular: Negative for chest pain.  Gastrointestinal: Negative for abdominal pain.   Neurological: Positive for weakness.  All other systems reviewed and are negative.     Allergies  Protonix; Ace inhibitors; Amoxicillin; Biaxin; Codeine; Darvon; Ibuprofen; Mercurochrome; Pantoprazole; Prilosec; Septra; Sulfa drugs cross reactors; Tramadol; and Vesicare  Home Medications   Prior to Admission medications   Medication Sig Start Date End Date Taking? Authorizing Provider  acetaminophen (TYLENOL) 500 MG tablet Take 1 tablet (500 mg total) by mouth every 4 (four) hours as needed for mild pain or moderate pain. 12/07/14   Harvel Quale, MD  Artificial Saliva (BIOTENE MOISTURIZING MOUTH MT) Use as directed in the mouth or throat. Use as directed    Historical Provider, MD  aspirin EC 81 MG tablet Take 81 mg by mouth daily.    Historical Provider, MD  Calcium & Magnesium Carbonates (MYLANTA PO) Take 30 mLs by mouth 4 (four) times daily as needed (indigestion).     Historical Provider, MD  Calcium Carbonate Antacid 400 MG CHEW Chew 800 mg by mouth every 4 (four) hours as needed (heartburn).    Historical Provider, MD  cetirizine (ZYRTEC) 10 MG tablet Take 5 mg by mouth daily.     Historical Provider, MD  Cholecalciferol (VITAMIN D3) 1000 UNITS CAPS Take 1 capsule by mouth daily.     Historical Provider, MD  docusate sodium (COLACE) 100 MG capsule Take 1 capsule (100 mg total) by mouth every 12 (twelve) hours. 09/27/14   Courteney Lyn Mackuen, MD  fluticasone (FLONASE) 50 MCG/ACT nasal spray Place 1 spray into both nostrils daily.     Historical Provider, MD  levothyroxine (SYNTHROID, LEVOTHROID) 100 MCG tablet Take 100 mcg by mouth daily before breakfast. 11/18/14   Historical Provider, MD  losartan (COZAAR) 25 MG tablet Take 25 mg by mouth daily.    Historical Provider, MD  menthol-cetylpyridinium (CEPACOL) 3 MG lozenge Take 1 lozenge by mouth every 2 (two) hours as needed for sore throat.    Historical Provider, MD  metoprolol tartrate (LOPRESSOR) 25 MG tablet Take 12.5 mg by  mouth at bedtime.     Historical Provider, MD  multivitamin [facility]) per tablet Take 1 tablet by mouth daily.      Historical Provider, MD  Polyethyl Glycol-Propyl Glycol (SYSTANE) 0.4-0.3 % SOLN Apply 1 drop to eye daily as needed (dry eyes).    Historical Provider, MD  polyvinyl alcohol (LIQUIFILM TEARS) 1.4 % ophthalmic solution Place 1 drop into both eyes 4 (four) times daily as needed for dry eyes.    Historical Provider, MD  potassium chloride SA (K-DUR,KLOR-CON) 20 MEQ tablet Take 20 mEq by mouth daily.    Historical Provider, MD  torsemide (DEMADEX) 20 MG tablet Take one tablet daily to help regulate and prevent edema Patient taking differently: Take 20 mg by mouth daily. Take one tablet daily to help regulate and prevent edema 10/18/14   Estill Dooms, MD   BP 100/61 mmHg  Pulse 150  Temp(Src) 99.1 F (37.3 C) (Rectal)  Resp 18  SpO2 96% Physical Exam  Constitutional:  She is oriented to person, place, and time. She appears well-developed and well-nourished. No distress.  HENT:  Head: Normocephalic.  Eyes: Conjunctivae are normal.  Neck: Neck supple. No tracheal deviation present.  Cardiovascular: Normal heart sounds.  An irregularly irregular rhythm present. Tachycardia present.   Pulmonary/Chest: Effort normal. No respiratory distress. She has no decreased breath sounds. She has no wheezes. She has no rhonchi. She has no rales.  Abdominal: Soft. She exhibits no distension.  Neurological: She is alert and oriented to person, place, and time.  Skin: Skin is warm and dry.  Psychiatric: She has a normal mood and affect.    ED Course  Procedures (including critical care time)  CRITICAL CARE Performed by: Leo Grosser Total critical care time: 30 minutes Critical care time was exclusive of separately billable procedures and treating other patients. Critical care was necessary to treat or prevent imminent or life-threatening deterioration. Critical care was time spent  personally by me on the following activities: development of treatment plan with patient and/or surrogate as well as nursing, discussions with consultants, evaluation of patient's response to treatment, examination of patient, obtaining history from patient or surrogate, ordering and performing treatments and interventions, ordering and review of laboratory studies, ordering and review of radiographic studies, pulse oximetry and re-evaluation of patient's condition.  Emergency Focused Ultrasound Exam Limited Ultrasound of the Heart and Pericardium  Performed and interpreted by Dr. Laneta Simmers Indication: hypotension, tachycardia Multiple views of the heart, pericardium, and IVC are obtained with a multi frequency probe.  Findings: depressed contractility, no anechoic fluid, no IVC collapse, large atria Interpretation: depressed ejection fraction, no pericardial effusion, elevated CVP with bi-atrial enlargement Images archived electronically.  CPT Code: [id number]   Labs Review Labs Reviewed  CBC WITH DIFFERENTIAL/PLATELET - Abnormal; Notable for the following:    RBC 3.46 (*)    Hemoglobin 11.8 (*)    HCT 34.2 (*)    MCH 34.1 (*)    Monocytes Absolute 1.2 (*)    All other components within normal limits  COMPREHENSIVE METABOLIC PANEL - Abnormal; Notable for the following:    CO2 18 (*)    Glucose, Bld 100 (*)    BUN 76 (*)    Creatinine, Ser 2.08 (*)    Total Protein 6.4 (*)    Albumin 3.2 (*)    GFR calc non Af Amer 19 (*)    GFR calc Af Amer 22 (*)    Anion gap 17 (*)    All other components within normal limits  MAGNESIUM  BRAIN NATRIURETIC PEPTIDE  I-STAT TROPOININ, ED  CBG MONITORING, ED    Imaging Review Dg Chest Port 1 View  02/14/2015  CLINICAL DATA:  Tachycardia starting this morning. Recent bronchitis. EXAM: PORTABLE CHEST 1 VIEW COMPARISON:  09/27/2014 FINDINGS: Defibrillator patch is noted. Atherosclerotic calcification of the aortic arch. Stable mild enlargement of the  cardiopericardial silhouette. Upper zone pulmonary vascular prominence is indeterminate due to the semi erect positioning. Bilateral chronic rotator cuff tears with delamination of the acromion humeral space. No significant blunting of the costophrenic angles. IMPRESSION: 1. Stable mild enlargement of the cardiopericardial silhouette. There is upper zone pulmonary vascular prominence which is nonspecific given the semi erect positioning, but which could reflect pulmonary venous hypertension. No overt edema. 2. Bilateral chronic rotator cuff tears with severe degenerative glenohumeral arthropathy bilaterally. 3. Atherosclerotic aortic arch. Electronically Signed   By: Van Clines M.D.   On: 02/14/2015 18:38   I have personally reviewed and evaluated these  images and lab results as part of my medical decision-making.  #1 on arrival  EKG Interpretation   Date/Time:  Thursday February 14 2015 17:36:57 EST Ventricular Rate:  140 PR Interval:    QRS Duration: 104 QT Interval:  312 QTC Calculation: 476 R Axis:   16 Text Interpretation:  Atrial fibrillation with rapid V-rate Repolarization  abnormality, prob rate related Atrial fibrillation New since previous  tracing Confirmed by Makaylyn Sinyard MD, Serai Tukes [phone]) on 02/14/2015 6:16:51 PM    #2 after diltiazem initiated  EKG Interpretation  Date/Time:  Thursday February 14 2015 19:12:29 EST Ventricular Rate:  118 PR Interval:    QRS Duration: 103 QT Interval:  351 QTC Calculation: 492 R Axis:   44 Text Interpretation:  Atrial fibrillation Repolarization abnormality, prob rate related Since last tracing rate slower Confirmed by Serafino Burciaga MD, Quillian Quince [id number]) on 02/14/2015 7:15:44 PM        MDM   Final diagnoses:  Cough  Atrial fibrillation with RVR (HCC)  Acute on chronic heart failure, unspecified heart failure type ([place])    82 y.o. female presents with atrial fibrillation and rapid ventricular rate that started today, she has been feeling  weak and had some ongoing cough and 6 pounds of weight gain despite home torsemide. Clinically she is fluid overloaded but does have a slightly depressed blood pressure likely secondary to her rapid heart rate. Bedside ultrasound demonstrated dilated in variable IVC and enlarged atria likely representing acute heart failure exacerbation with her severe mitral insufficiency underlying the onset of atrial fibrillation. No significant hematologic or metabolic abnormalities to explain symptoms.  Diuresis started here and diltiazem infusion for rate control to optimize ejection fraction, not requiring oxygen and chest x-ray without acute abnormalities. Cardiology was consulted and will admit the patient after evaluating the emergency department.    Leo Grosser, MD 02/15/15 [phone]

## 2015-02-14 NOTE — ED Notes (Signed)
X-ray at bedside

## 2015-02-14 NOTE — H&P (Addendum)
History and Physical   Patient ID: Ann [name] MRN: [id number], DOB/AGE: [birth year] 80 y.o. Date of Encounter: 02/14/2015  Primary Physician: Estill Dooms, MD Primary Cardiologist: Dr Mare Ferrari  Chief Complaint:  Atrial fib, RVR & CHF  HPI: Ann [name] is a 82 y.o. female with a history of SVT, MR, D-CHF, CKD, HTN, allergic to Lasix, who lives in [place].  Seen by Dr Mare Ferrari 11/28/2014 and volume was stable, she was in [place], weight 162.  Pt states her weight was up 6 lbs in the last week or so. Increased DOE but she really doesn't walk very much. Ambulates in room, uses scooter to get further. She has not had clear PND or orthopnea. There has been LE edema. She is just getting over a bad cold, took Levaquin for 7 days, completing it at the end of January, and the cough has improved. She is still coughing up yellow sputum at times. No fevers.   Last pm, she had relatively sudden onset of dysphagia. She vomited several times. She states she cannot swallow solid foods. She states has had some liquids today and swallowed them well.  She was seen by the NP at [facility] for the vomiting. Her HR was 140, she was weak. She had no chest pain. She was in atrial fib and sent to the ER. In the ER, she was volume overloaded and was given Lasix 80 mg IV. She is currently not SOB on O2. She has no awareness of a rapid or irregular HR. She has had no light-headed or dizzy spells.   In the ER, she has had Lasix and is on Cardizem at 10 mg/hr. Her heart rate is about 110 and her SBP is also about 110. She is resting comfortably at this time.    Past Medical History  Diagnosis Date  . Mitral regurgitation   . Hypertension   . Palpitations   . Nocturnal dyspnea   . Advanced age   . Chronic diastolic CHF (congestive heart failure) ([place])     Last echo in 5/11  . Pain in joint, lower leg 02/25/2012  . Pain in joint, lower leg 02/23/2012  . Encounter for long-term (current) use of  other medications 10/02/2011  . Cough 07/17/2011  . Internal hemorrhoids without mention of complication [id number]  . Unspecified constipation 01/01/2011  . Rash and other nonspecific skin eruption 08/14/2010  . Tension headache 07/31/2010  . Benign paroxysmal positional vertigo 07/24/2010  . Cervicalgia 07/24/2010  . Urinary tract infection, site not specified 07/10/2010  . Abnormality of gait 05/09/2009  . Vitamin D deficiency 01/25/2009  . Lumbago 01/05/2008  . Cervicitis and endocervicitis 12/22/2007  . Actinic keratosis 12/22/2007  . Pain in limb 12/22/2007  . Muscle weakness (generalized) 08/04/2007  . Blepharochalasis 05/12/2007  . Insomnia, unspecified 05/12/2007  . Dermatophytosis of foot 07/14/2006  . Dizziness and giddiness 06/25/2006  . Allergic rhinitis due to pollen 04/21/2006  . Dyskinesia of esophagus 04/12/2006  . Other malaise and fatigue 04/01/2006  . Unspecified urinary incontinence 02/10/2006  . Cramp of limb 10/07/2005  . Restless legs syndrome (RLS) 06/30/2005  . CKD (chronic kidney disease), stage III   . Hypothyroidism   . Diaphragmatic hernia without mention of obstruction or gangrene 10/03/2003  . Unspecified venous (peripheral) insufficiency 01/05/1998  . Reflux esophagitis 01/06/1996  . Peptic ulcer, unspecified site, unspecified as acute or chronic, without mention of hemorrhage, perforation, or obstruction 01/06/1996  . Diverticulosis of colon (  without mention of hemorrhage) 01/06/1996  . Pain in right knee 08/08/2012  . Macular degeneration of right eye 1980's  . Macular degeneration 09/21/2013  . Varicose vein of leg 02/08/2014  . Mitral valve regurgitation     Moderate-severe by echo 05/2014  . Systolic and diastolic CHF, chronic ([place])     Surgical History:  Past Surgical History  Procedure Laterality Date  . Tonsillectomy and adenoidectomy  1940  . Appendectomy  1940's  . Oophorectomy  1940's  . Breast surgery  1950's    benign tumors removed  . Cataract extraction w/  intraocular lens  implant, bilateral  1982  . Lesion excision  04/2002    melanomas of scalp  Dr. Link Snuffer  . Skin cancer excision  2005    BCC of right temple  . Cholecystectomy, laparoscopic  10/2004    Dr. Deon Pilling  . Tooth extraction  06/2008    and bone implant  Dr. Matilde Haymaker  . Colonoscopy  2000     I have reviewed the patient's current medications. Medication Sig  acetaminophen (TYLENOL) 500 MG tablet Take 1 tablet (500 mg total) by mouth every 4 (four) hours as needed for mild pain or moderate pain. Patient taking differently: Take 500 mg by mouth 3 (three) times daily.   Artificial Saliva (BIOTENE MOISTURIZING MOUTH MT) Use as directed in the mouth or throat. Use as directed  aspirin EC 81 MG tablet Take 81 mg by mouth daily.  Calcium & Magnesium Carbonates (MYLANTA PO) Take 30 mLs by mouth 4 (four) times daily as needed (indigestion).   Calcium Carbonate Antacid 400 MG CHEW Chew 800 mg by mouth every 4 (four) hours as needed (heartburn).  calcium-vitamin D (OSCAL WITH D) 500-200 MG-UNIT tablet Take 1 tablet by mouth daily with breakfast.  cetirizine (ZYRTEC) 10 MG tablet Take 5 mg by mouth daily.   Cholecalciferol (VITAMIN D3) 1000 UNITS CAPS Take 1 capsule by mouth daily.   docusate sodium (COLACE) 100 MG capsule Take 1 capsule (100 mg total) by mouth every 12 (twelve) hours.  fluticasone (FLONASE) 50 MCG/ACT nasal spray Place 1 spray into both nostrils daily.   levothyroxine (SYNTHROID, LEVOTHROID) 100 MCG tablet Take 100 mcg by mouth daily before breakfast.  menthol-cetylpyridinium (CEPACOL) 3 MG lozenge Take 1 lozenge by mouth every 2 (two) hours as needed for sore throat.  metoprolol (LOPRESSOR) 50 MG tablet Take 50 mg by mouth 2 (two) times daily.  multivitamin (THERAGRAN) per tablet Take 1 tablet by mouth daily.    Polyethyl Glycol-Propyl Glycol (SYSTANE) 0.4-0.3 % SOLN Apply 1 drop to eye daily as needed (dry eyes).  polyvinyl alcohol (LIQUIFILM TEARS) 1.4 %  ophthalmic solution Place 1 drop into both eyes 4 (four) times daily as needed for dry eyes.  promethazine (PHENERGAN) 25 MG suppository Place 25 mg rectally every 6 (six) hours as needed for nausea or vomiting.  ranitidine (ZANTAC) 150 MG tablet Take 150 mg by mouth 2 (two) times daily.  spironolactone (ALDACTONE) 25 MG tablet Take 25 mg by mouth daily.  torsemide (DEMADEX) 20 MG tablet Take one tablet daily to help regulate and prevent edema Patient taking differently: Take 20 mg by mouth daily. Take one tablet daily to help regulate and prevent edema  losartan (COZAAR) 25 MG tablet Take 25 mg by mouth daily.   Scheduled Meds: . torsemide  20 mg Oral BID   Continuous Infusions: . diltiazem (CARDIZEM) infusion 10 mg/hr (02/14/15 1843)   PRN Meds:.  Allergies:  Allergies  Allergen Reactions  . Protonix [Pantoprazole Sodium] Rash  . Ace Inhibitors Cough  . Amoxicillin Diarrhea  . Biaxin [Clarithromycin]     Noted on info sheet from independent living  . Codeine   . Darvon   . Ibuprofen Other (See Comments)    Noted on info sheet from independent living  . Mercurochrome [Merbromin (Mercurochrome)]   . Pantoprazole     Dizzy  . Prilosec [Omeprazole] Other (See Comments)    Noted on info sheet from independent living  . Septra [Sulfamethoxazole-Trimethoprim]   . Sulfa Drugs Cross Reactors Lasix  . Tramadol     Abnormal kidney function  . Vesicare [Solifenacin]     Drys out nose and throat     Social History   Social History  . Marital Status: Widowed    Spouse Name: N/A  . Number of Children: N/A  . Years of Education: N/A   Occupational History  . retired Sales promotion account executive    Social History Main Topics  . Smoking status: Never Smoker   . Smokeless tobacco: Never Used  . Alcohol Use: No  . Drug Use: No  . Sexual Activity: No   Other Topics Concern  . Not on file   Social History Narrative   Lives at [facility] since 2008   Widowed 2006   Walks with  walker          Family History  Problem Relation Age of Onset  . Stroke Mother   . Heart disease Father   . Diabetes Father   . Heart disease Brother     MI  . Alcohol abuse Sister    Family Status  Relation Status Death Age  . Mother Deceased   . Father Deceased   . Sister Deceased     suicide  . Brother Deceased     suicide  . Daughter Alive   . Brother Deceased   . Sister Deceased   . Maternal Grandmother Deceased   . Maternal Grandfather Deceased   . Paternal Grandmother Deceased   . Paternal Grandfather Deceased     Review of Systems:   Full 14-point review of systems otherwise negative except as noted above.  Physical Exam: Blood pressure 117/57, pulse 113, temperature 99.1 F (37.3 C), temperature source Rectal, resp. rate 21, SpO2 92 %. General: Well developed, well nourished,female in no acute distress. Head: Normocephalic, atraumatic, sclera non-icteric, no xanthomas, nares are without discharge. Dentition: poor Neck: No carotid bruits. JVD elevated at 10 cm. No thyromegally Lungs: Good expansion bilaterally. without wheezes or rhonchi. Rales bilaterally  Heart: IRRegular rate and rhythm with S1 S2.  No S3 or S4.  2/6 murmur, no rubs, or gallops appreciated. Abdomen: Soft, non-tender, non-distended with normoactive bowel sounds. No hepatomegaly. No rebound/guarding. No obvious abdominal masses. Msk:  Strength and tone appear normal for age. No joint deformities or effusions, no spine or costo-vertebral angle tenderness. Extremities: No clubbing or cyanosis. Trace-1+ edema.  Distal pedal pulses are 2+ in 4 extrem Neuro: Alert and oriented X 3. Moves all extremities spontaneously. No focal deficits noted. Psych:  Responds to questions appropriately with a normal affect. Skin: No rashes or lesions noted  Labs:   Lab Results  Component Value Date   WBC 7.7 02/14/2015   HGB 11.8* 02/14/2015   HCT 34.2* 02/14/2015   MCV 98.8 02/14/2015   PLT 266 02/14/2015       Recent Labs Lab 02/14/15 1821  NA 137  K 3.5  CL  102  CO2 18*  BUN 76*  CREATININE 2.08*  CALCIUM 9.5  PROT 6.4*  BILITOT 0.9  ALKPHOS 91  ALT 20  AST 29  GLUCOSE 100*    Recent Labs  02/14/15 1756  TROPIPOC 0.06   Lab Results  Component Value Date   CHOL 174 06/07/2012   HDL 65 06/07/2012   LDLCALC 89 06/07/2012   TRIG 98 06/07/2012   B NATRIURETIC PEPTIDE  Date/Time Value Ref Range Status  02/14/2015 05:42 PM 630.9* 0.0 - 100.0 pg/mL Final  09/27/2014 10:28 AM 584.2* 0.0 - 100.0 pg/mL Final  05/24/2014 03:26 AM 587.9* 0.0 - 100.0 pg/mL Final    Radiology/Studies: Dg Chest Port 1 View 02/14/2015  CLINICAL DATA:  Tachycardia starting this morning. Recent bronchitis. EXAM: PORTABLE CHEST 1 VIEW COMPARISON:  09/27/2014 FINDINGS: Defibrillator patch is noted. Atherosclerotic calcification of the aortic arch. Stable mild enlargement of the cardiopericardial silhouette. Upper zone pulmonary vascular prominence is indeterminate due to the semi erect positioning. Bilateral chronic rotator cuff tears with delamination of the acromion humeral space. No significant blunting of the costophrenic angles. IMPRESSION: 1. Stable mild enlargement of the cardiopericardial silhouette. There is upper zone pulmonary vascular prominence which is nonspecific given the semi erect positioning, but which could reflect pulmonary venous hypertension. No overt edema. 2. Bilateral chronic rotator cuff tears with severe degenerative glenohumeral arthropathy bilaterally. 3. Atherosclerotic aortic arch. Electronically Signed   By: Van Clines M.D.   On: 02/14/2015 18:38    Echo: 05/2014 - Left ventricle: Septal and posterior basal hypokinesis. The cavity size was mildly dilated. Wall thickness was normal. Systolic function was mildly to moderately reduced. The estimated ejection fraction was in the range of 40% to 45%. Doppler parameters are consistent with elevated ventricular  end-diastolic filling pressure. - Aortic valve: Mild systolic gradient with no stenosis. Valve area (Vmax): 1.46 cm^2. - Mitral valve: Moderate to severe MR Likely ischemic with restricted posterior leaflet motion. - Left atrium: The atrium was moderately dilated. 46 mm - Atrial septum: No defect or patent foramen ovale was identified. - Pulmonary arteries: PA peak pressure: 37 mm Hg (S). - Impressions: Consider TEE to further assess degree of MR. Impressions: - Consider TEE to further assess degree of MR.  ECG: 02/14/2015 Atrial fib, RVR, occ PVCs HR 118  ASSESSMENT AND PLAN:  Principal Problem:   Acute on chronic combined systolic and diastolic congestive heart failure, NYHA class 4 (HCC) - admit, has had IV Lasix x 1 and did well, but this was listed as causing a rash so we will stop it - increase Demadex 20 mg qd>>20 mg bid and see if this is adequate for diuresis. - foley cath - track I/O, weights, BMET - Echo is < 1 yr old, MD advise on repeat.  Active Problems:   Chronic kidney disease, stage III (moderate) - hold Cozaar    Paroxysmal atrial fibrillation with rapid ventricular response (HCC) - Cardizem not the best drug with LVD - start IV Amio, ck TSH & LFTs     Anticoagulation - has not been on previously - will start Eliquis 2.5 mg bid    Code status - she has been DNR, will continue this   Augusto Garbe 02/14/2015 8:12 PM Beeper [id number]  Attending Note  Patient seen and discussed with NP Barrett, I agree with her documentation. 83 yo female history of palpitations, PSVT, MR, chronic combined systolic and diastolic heart failure with LVEF 40-45% by echo 05/2014, HTN, CKD III-IV,  admitted with new onset afib with RVR and acute on chronic diastolic heart failure. She was given 20mg  IV dilt bolus in ER and started on dilt gtt for rate control, bp's soft but stable around 90/60s, heart rates low 100s.     ER vitals: wt 165 (up from 162  lbs last clinic visit) p 143 bp 90/60 95% RA 05/2014 echo: LVEF 40-45%, abnormal diastolic function, moderate to severe MR, PASP 37 Mg 2.3, trop neg, K 3.5, Cr 2.08 (baseline 1.5-1.9) GFR 19, Hgb 11.8, Plt 266 CXR cardiomegaly EKG afib with RVR   New diagnosis of afib with RVR, she has a long history of palpitations and prior diagnosis of PSVT, based on duration of her symptoms the exact duration of afib is unclear, would not DCCV without prior TEE or 3 weeks anticoag. Afib with RVR likely the exacerbating factor of her acute on chronic systolic/diastolic heart failure. Given her systolic dysfunction, soft bp's, and remaining elevated heart rates we will stop dilt and start amio. Given her advanced age this would be a reasonable long term option for her as well. She reports previous rash to lasix but tolerates toresmide, we will start torsemide 20mg  bid po as she has already received an IV diuretic in the ER. CHADS2Vasc score of 5 (agex2, gender, HTN, CHF) we will start eliquis 2.5mg  bid and stop ASA. Discussed with patient's daughter who indicates patient has not had issues with bleeding or falls. Hold her beta blocker for the time being, follow rates and bp's on amio drip overnight, may need to resume in AM. Hold cozaar and aldactone given GFR of 20.    Zandra Abts MD

## 2015-02-15 DIAGNOSIS — I480 Paroxysmal atrial fibrillation: Secondary | ICD-10-CM

## 2015-02-15 DIAGNOSIS — R1310 Dysphagia, unspecified: Secondary | ICD-10-CM

## 2015-02-15 DIAGNOSIS — K224 Dyskinesia of esophagus: Secondary | ICD-10-CM | POA: Diagnosis present

## 2015-02-15 DIAGNOSIS — N183 Chronic kidney disease, stage 3 (moderate): Secondary | ICD-10-CM

## 2015-02-15 LAB — BASIC METABOLIC PANEL
ANION GAP: 10 (ref 5–15)
BUN: 76 mg/dL — AB (ref 6–20)
CALCIUM: 8.8 mg/dL — AB (ref 8.9–10.3)
CO2: 21 mmol/L — AB (ref 22–32)
Chloride: 106 mmol/L (ref 101–111)
Creatinine, Ser: 2.09 mg/dL — ABNORMAL HIGH (ref 0.44–1.00)
GFR calc Af Amer: 22 mL/min — ABNORMAL LOW (ref >60)
GFR calc non Af Amer: 19 mL/min — ABNORMAL LOW (ref >60)
GLUCOSE: 103 mg/dL — AB (ref 65–99)
Potassium: 3.8 mmol/L (ref 3.5–5.1)
Sodium: 137 mmol/L (ref 135–145)

## 2015-02-15 LAB — TROPONIN I
TROPONIN I: 0.08 ng/mL — AB (ref <0.031)
Troponin I: 0.07 ng/mL — ABNORMAL HIGH (ref <0.031)

## 2015-02-15 LAB — T4, FREE: Free T4: 1.44 ng/dL — ABNORMAL HIGH (ref 0.61–1.12)

## 2015-02-15 LAB — TSH: TSH: 4.813 u[IU]/mL — AB (ref 0.350–4.500)

## 2015-02-15 LAB — MRSA PCR SCREENING: MRSA by PCR: NEGATIVE

## 2015-02-15 MED ORDER — ENSURE ENLIVE PO LIQD
237.0000 mL | Freq: Two times a day (BID) | ORAL | Status: DC
  Administered 2015-02-15 – 2015-02-22 (×8): 237 mL via ORAL

## 2015-02-15 MED ORDER — FAMOTIDINE 20 MG PO TABS
20.0000 mg | ORAL_TABLET | Freq: Every day | ORAL | Status: DC
  Administered 2015-02-16 – 2015-02-22 (×7): 20 mg via ORAL
  Filled 2015-02-15 (×7): qty 1

## 2015-02-15 MED ORDER — OFF THE BEAT BOOK
Freq: Once | Status: AC
  Administered 2015-02-15: 11:00:00
  Filled 2015-02-15: qty 1

## 2015-02-15 NOTE — Addendum Note (Signed)
Addended by: Moshe Cipro Sharilyn Geisinger A on: 02/15/2015 11:36 AM   Modules accepted: Orders

## 2015-02-15 NOTE — Research (Signed)
REDS'@Discharge'$  Informed Consent   Subject Name: Ann [name]  Subject met inclusion and exclusion criteria.  The informed consent form, study requirements and expectations were reviewed with the subject and questions and concerns were addressed prior to the signing of the consent form.  The subject verbalized understanding of the trail requirements.  The subject agreed to participate in the REDS '@Discharge'$  trial and signed the informed consent.  The informed consent was obtained prior to performance of any protocol-specific procedures for the subject.  A copy of the signed informed consent was given to the subject and a copy was placed in the subject's medical record.  Sandie Ano 02/15/2015, 10:24 AM

## 2015-02-15 NOTE — Progress Notes (Signed)
Patient is a resident of [place]. CSW received a call from [place] at [facility] requesting update on patient.  States that if patient needs an increased level of care to SNF- she does not current have a SNF bed and would need to seek placement at [facility].  Will await PT recommendation to determine d/c needs. Full SW Psychosocial Assessment to follow if higher level of care is indicated. Per PA Cardiology note- patient has requested change in DNR status from DNR to limited Code.  This has been reflected in her medical chart.  [name]. Pauline Good, [place]

## 2015-02-15 NOTE — Progress Notes (Signed)
  Was called to patient bedside as she wanted to readdress Code Status. Patient has opted to change from DNR to limited Code. She would want CPR in the event of cardiac arrest but wishes to not be intubated. Code Status was modified per patient's wishes.   SIMMONS, BRITTAINY 02/15/2015

## 2015-02-15 NOTE — Discharge Instructions (Signed)
Atrial Fibrillation °Atrial fibrillation is a type of irregular or rapid heartbeat (arrhythmia). In atrial fibrillation, the heart quivers continuously in a chaotic pattern. This occurs when parts of the heart receive disorganized signals that make the heart unable to pump blood normally. This can increase the risk for stroke, heart failure, and other heart-related conditions. There are different types of atrial fibrillation, including: °· Paroxysmal atrial fibrillation. This type starts suddenly, and it usually stops on its own shortly after it starts. °· Persistent atrial fibrillation. This type often lasts longer than a week. It may stop on its own or with treatment. °· Long-lasting persistent atrial fibrillation. This type lasts longer than 12 months. °· Permanent atrial fibrillation. This type does not go away. °Talk with your health care provider to learn about the type of atrial fibrillation that you have. °CAUSES °This condition is caused by some heart-related conditions or procedures, including: °· A heart attack. °· Coronary artery disease. °· Heart failure. °· Heart valve conditions. °· High blood pressure. °· Inflammation of the sac that surrounds the heart (pericarditis). °· Heart surgery. °· Certain heart rhythm disorders, such as Wolf-Parkinson-White syndrome. °Other causes include: °· Pneumonia. °· Obstructive sleep apnea. °· Blockage of an artery in the lungs (pulmonary embolism, or PE). °· Lung cancer. °· Chronic lung disease. °· Thyroid problems, especially if the thyroid is overactive (hyperthyroidism). °· Caffeine. °· Excessive alcohol use or illegal drug use. °· Use of some medicines, including certain decongestants and diet pills. °Sometimes, the cause cannot be found. °RISK FACTORS °This condition is more likely to develop in: °· People who are older in age. °· People who smoke. °· People who have diabetes mellitus. °· People who are overweight (obese). °· Athletes who exercise  vigorously. °SYMPTOMS °Symptoms of this condition include: °· A feeling that your heart is beating rapidly or irregularly. °· A feeling of discomfort or pain in your chest. °· Shortness of breath. °· Sudden light-headedness or weakness. °· Getting tired easily during exercise. °In some cases, there are no symptoms. °DIAGNOSIS °Your health care provider may be able to detect atrial fibrillation when taking your pulse. If detected, this condition may be diagnosed with: °· An electrocardiogram (ECG). °· A Holter monitor test that records your heartbeat patterns over a 24-hour period. °· Transthoracic echocardiogram (TTE) to evaluate how blood flows through your heart. °· Transesophageal echocardiogram (TEE) to view more detailed images of your heart. °· A stress test. °· Imaging tests, such as a CT scan or chest X-ray. °· Blood tests. °TREATMENT °The main goals of treatment are to prevent blood clots from forming and to keep your heart beating at a normal rate and rhythm. The type of treatment that you receive depends on many factors, such as your underlying medical conditions and how you feel when you are experiencing atrial fibrillation. °This condition may be treated with: °· Medicine to slow down the heart rate, bring the heart's rhythm back to normal, or prevent clots from forming. °· Electrical cardioversion. This is a procedure that resets your heart's rhythm by delivering a controlled, low-energy shock to the heart through your skin. °· Different types of ablation, such as catheter ablation, catheter ablation with pacemaker, or surgical ablation. These procedures destroy the heart tissues that send abnormal signals. When the pacemaker is used, it is placed under your skin to help your heart beat in a regular rhythm. °HOME CARE INSTRUCTIONS °· Take over-the counter and prescription medicines only as told by your health care provider. °·   If your health care provider prescribed a blood-thinning medicine  (anticoagulant), take it exactly as told. Taking too much blood-thinning medicine can cause bleeding. If you do not take enough blood-thinning medicine, you will not have the protection that you need against stroke and other problems. °· Do not use tobacco products, including cigarettes, chewing tobacco, and e-cigarettes. If you need help quitting, ask your health care provider. °· If you have obstructive sleep apnea, manage your condition as told by your health care provider. °· Do not drink alcohol. °· Do not drink beverages that contain caffeine, such as coffee, soda, and tea. °· Maintain a healthy weight. Do not use diet pills unless your health care provider approves. Diet pills may make heart problems worse. °· Follow diet instructions as told by your health care provider. °· Exercise regularly as told by your health care provider. °· Keep all follow-up visits as told by your health care provider. This is important. °PREVENTION °· Avoid drinking beverages that contain caffeine or alcohol. °· Avoid certain medicines, especially medicines that are used for breathing problems. °· Avoid certain herbs and herbal medicines, such as those that contain ephedra or ginseng. °· Do not use illegal drugs, such as cocaine and amphetamines. °· Do not smoke. °· Manage your high blood pressure. °SEEK MEDICAL CARE IF: °· You notice a change in the rate, rhythm, or strength of your heartbeat. °· You are taking an anticoagulant and you notice increased bruising. °· You tire more easily when you exercise or exert yourself. °SEEK IMMEDIATE MEDICAL CARE IF: °· You have chest pain, abdominal pain, sweating, or weakness. °· You feel nauseous. °· You notice blood in your vomit, bowel movement, or urine. °· You have shortness of breath. °· You suddenly have swollen feet and ankles. °· You feel dizzy. °· You have sudden weakness or numbness of the face, arm, or leg, especially on one side of the body. °· You have trouble speaking,  trouble understanding, or both (aphasia). °· Your face or your eyelid droops on one side. °These symptoms may represent a serious problem that is an emergency. Do not wait to see if the symptoms will go away. Get medical help right away. Call your local emergency services (911 in the [place] Do not drive yourself to the hospital. °  °This information is not intended to replace advice given to you by your health care provider. Make sure you discuss any questions you have with your health care provider. °  °Document Released: 12/22/2004 Document Revised: 09/12/2014 Document Reviewed: 04/18/2014 °Elsevier Interactive Patient Education ©2016 [employer] ° ° ° °Information on my medicine - ELIQUIS® (apixaban) ° ° °Why was Eliquis® prescribed for you? °Eliquis® was prescribed for you to reduce the risk of a blood clot forming that can cause a stroke if you have a medical condition called atrial fibrillation (a type of irregular heartbeat). ° °What do You need to know about Eliquis® ? °Take your Eliquis® TWICE DAILY - one tablet in the morning and one tablet in the evening with or without food. If you have difficulty swallowing the tablet whole please discuss with your pharmacist how to take the medication safely. ° °Take Eliquis® exactly as prescribed by your doctor and DO NOT stop taking Eliquis® without talking to the doctor who prescribed the medication.  Stopping may increase your risk of developing a stroke.  Refill your prescription before you run out. ° °After discharge, you should have regular check-up appointments with your healthcare provider that is   prescribing your Eliquis®.  In the future your dose may need to be changed if your kidney function or weight changes by a significant amount or as you get older. ° °What do you do if you miss a dose? °If you miss a dose, take it as soon as you remember on the same day and resume taking twice daily.  Do not take more than one dose of ELIQUIS at the same time to make  up a missed dose. ° °Important Safety Information °A possible side effect of Eliquis® is bleeding. You should call your healthcare provider right away if you experience any of the following: °? Bleeding from an injury or your nose that does not stop. °? Unusual colored urine (red or dark brown) or unusual colored stools (red or black). °? Unusual bruising for unknown reasons. °? A serious fall or if you hit your head (even if there is no bleeding). ° °Some medicines may interact with Eliquis® and might increase your risk of bleeding or clotting while on Eliquis®. To help avoid this, consult your healthcare provider or pharmacist prior to using any new prescription or non-prescription medications, including herbals, vitamins, non-steroidal anti-inflammatory drugs (NSAIDs) and supplements. ° °This website has more information on Eliquis® (apixaban): http://www.eliquis.com/eliquis/home °

## 2015-02-15 NOTE — Progress Notes (Signed)
Patient Profile: 82 yo female history of palpitations, PSVT, MR, chronic combined systolic and diastolic heart failure with LVEF 40-45% by echo 05/2014, HTN, CKD III-IV, admitted with new onset afib with RVR and acute on chronic diastolic heart failure.  Subjective: Currently asymptomatic with her afib. Her only complaint is difficulty swallowing. She notes a h/o esophageal dysmotility disorder. She is having difficulty with hard solids, but tolerating liquids and applesauce ok.   Objective: Vital signs in last 24 hours: Temp:  [97.4 F (36.3 C)-99.1 F (37.3 C)] 97.9 F (36.6 C) (02/10 0400) Pulse Rate:  [61-150] 126 (02/10 0800) Resp:  [18-25] 20 (02/10 0400) BP: (82-117)/(43-86) 90/64 mmHg (02/10 0800) SpO2:  [92 %-99 %] 95 % (02/10 0400) Weight:  [167 lb (75.751 kg)-168 lb 8 oz (76.431 kg)] 167 lb (75.751 kg) (02/10 0533)    Intake/Output from previous day: 02/09 0701 - 02/10 0700 In: 1321.4 [P.O.:320; I.V.:1001.4] Out: 600 [Urine:600] Intake/Output this shift: Total I/O In: 220 [P.O.:220] Out: -   Medications Current Facility-Administered Medications  Medication Dose Route Frequency Provider Last Rate Last Dose  . 0.9 %  sodium chloride infusion  250 mL Intravenous PRN Rhonda G Barrett, PA-C      . acetaminophen (TYLENOL) tablet 500 mg  500 mg Oral TID Evelene Croon Barrett, PA-C   500 mg at 02/15/15 0157  . acetaminophen (TYLENOL) tablet 650 mg  650 mg Oral Q4H PRN Rhonda G Barrett, PA-C      . ALPRAZolam Duanne Moron) tablet 0.25 mg  0.25 mg Oral BID PRN Evelene Croon Barrett, PA-C      . amiodarone (NEXTERONE PREMIX) 360 MG/200ML (1.8 mg/mL) IV infusion  30 mg/hr Intravenous Continuous Rhonda G Barrett, PA-C 16.7 mL/hr at 02/15/15 0554 30 mg/hr at 02/15/15 0554  . antiseptic oral rinse (BIOTENE) solution 15 mL  15 mL Mouth Rinse Q2H PRN Skeet Simmer, RPH      . apixaban (ELIQUIS) tablet 2.5 mg  2.5 mg Oral BID Roma Schanz, RPH   2.5 mg at 02/15/15 0159  . calcium-vitamin D  (OSCAL WITH D) 500-200 MG-UNIT per tablet 1 tablet  1 tablet Oral Q breakfast Lonn Georgia, PA-C   1 tablet at 02/15/15 0841  . cholecalciferol (VITAMIN D) tablet 1,000 Units  1,000 Units Oral Daily Rhonda G Barrett, PA-C      . docusate sodium (COLACE) capsule 100 mg  100 mg Oral Q12H Rhonda G Barrett, PA-C   100 mg at 02/15/15 0840  . famotidine (PEPCID) tablet 20 mg  20 mg Oral BID Evelene Croon Barrett, PA-C   20 mg at 02/15/15 0157  . fluticasone (FLONASE) 50 MCG/ACT nasal spray 1 spray  1 spray Each Nare Daily PRN Rhonda G Barrett, PA-C      . levothyroxine (SYNTHROID, LEVOTHROID) tablet 100 mcg  100 mcg Oral QAC breakfast Evelene Croon Barrett, PA-C   100 mcg at 02/15/15 0840  . loratadine (CLARITIN) tablet 10 mg  10 mg Oral Daily Rhonda G Barrett, PA-C   10 mg at 02/15/15 0159  . menthol-cetylpyridinium (CEPACOL) lozenge 3 mg  1 lozenge Oral Q2H PRN Rhonda G Barrett, PA-C      . multivitamin with minerals tablet 1 tablet  1 tablet Oral Daily Rhonda G Barrett, PA-C      . nitroGLYCERIN (NITROSTAT) SL tablet 0.4 mg  0.4 mg Sublingual Q5 Min x 3 PRN Rhonda G Barrett, PA-C      . ondansetron (ZOFRAN) injection 4 mg  4  mg Intravenous Q6H PRN Rhonda G Barrett, PA-C      . polyvinyl alcohol (LIQUIFILM TEARS) 1.4 % ophthalmic solution 1 drop  1 drop Both Eyes QID PRN Rhonda G Barrett, PA-C      . sodium chloride flush (NS) 0.9 % injection 3 mL  3 mL Intravenous Q12H Rhonda G Barrett, PA-C   3 mL at 02/15/15 0200  . sodium chloride flush (NS) 0.9 % injection 3 mL  3 mL Intravenous PRN Rhonda G Barrett, PA-C      . torsemide (DEMADEX) tablet 20 mg  20 mg Oral BID Rhonda G Barrett, PA-C   20 mg at 02/15/15 0840  . zolpidem (AMBIEN) tablet 5 mg  5 mg Oral QHS PRN Lonn Georgia, PA-C        PE: General appearance: alert, cooperative and no distress Neck: no carotid bruit and + JVD Lungs: bilateral crackles at the bases Heart: irregularly irregular rhythm and tachy rate Extremities: no LEE Pulses: 2+  and symmetric Skin: warm and dry Neurologic: Grossly normal  Lab Results:   Recent Labs  02/14/15 1821  WBC 7.7  HGB 11.8*  HCT 34.2*  PLT 266   BMET  Recent Labs  02/14/15 1821 02/15/15 0420  NA 137 137  K 3.5 3.8  CL 102 106  CO2 18* 21*  GLUCOSE 100* 103*  BUN 76* 76*  CREATININE 2.08* 2.09*  CALCIUM 9.5 8.8*   Cardiac Panel (last 3 results)  Recent Labs  02/14/15 2233 02/15/15 0420  TROPONINI 0.08* 0.08*   Filed Weights   02/14/15 2120 02/15/15 0533  Weight: 168 lb 8 oz (76.431 kg) 167 lb (75.751 kg)     Assessment/Plan  Principal Problem:   Acute on chronic combined systolic and diastolic congestive heart failure, NYHA class 4 (HCC) Active Problems:   Chronic kidney disease, stage III (moderate)   Paroxysmal atrial fibrillation with rapid ventricular response (HCC)   CHF (congestive heart failure) ([place])   1. New Onset Atrial Fibrillation: exact duration of atrial fibrillation is unclear, thus she would not be a candidate for DCCV w/o prior TEE or 3 weeks of uninterrupted anticoagulation. Given her systolic dysfunction, soft bp's and remaining elevated heart rates, her Cardizem was discontinue and she is now on amiodarone. Given CHA2DS2 VASc score of 5 (age >70, gender, HTN, CHF), Eliquis 2.5 mg BID was started yesterday. Her rate remains poorly controlled in the 110s but she is asymptomatic. Continue to load with amiodarone. Consider TEE DCCV on Monday if still in afib.    2. Acute on Chronic Combined Systolic + Diastolic CHF: EF [id number] in 05/2014. Afib with RVR likely the exacerbating factor of her acute on chronic systolic/diastolic heart failure. Caredizem discontinued given LV dysfunction. She has an allergy to Lasix (previous rash) but tolerates toresmide. Currently on 20 mg PO BID. Total documented UOP yesterday only 600 mL but I/os were not being fully recorded. She now has a foley so that we can monitor her UOP more closely.  Weight only down 1 lb  from admission, now at 167. Continue po torsemide but monitor renal function closely given CKD and elevated SCr. K is stable.   3. Abnormal Troponin: low level flat trend, 0.08 x 2. Also in the setting of abnormal renal function with SCr of 2.0. Suspect demand ischemia in the setting of afib w/ RVR and acute on chronic CHF.  4. CKD Stage III-IV: SCr 2.08 (baseline 1.5-1.9). Cozaar currently on hold. She is on diuretic for  acute CHF. Continue to monitor. If renal function worsens, may need renal consult.   5. Esophageal Dysmotility Disorder: patient notes h/o this and difficulty swallowing solids. She is requesting Ensure. Will order.  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 She is on Synthroid. Will check free T3/T4.    LOS: 1 day    Brittainy M. Ladoris Gene 02/15/2015 9:09 AM  Patient seen and examined and history reviewed. Agree with above findings and plan. Pleasant 82 yo WF presents with new onset Afib. She is minimally symptomatic. Rate is improved on IV amiodarone but is still 110-120. Denies chest pain or SOB. Her biggest complaint is of dysphagia. She has a history of esophageal dysphagia. Acutely worse after eating turnip greens recently. She does not appear to be volume overloaded. Renal dysfunction increased from baseline. I would avoid further diuresis. Troponin elevation is c/w demand ischemia from Afib. Due to hypotension she is not a candidate for beta blocker or Cardizem now. We will see how she does on amiodarone. Hopefully she will convert to NSR. I would be very reluctant to do a TEE on this 82 yo lady with severe dysphagia. If symptoms do not improve with dysphagia diet may need to have GI see.   Gennie Eisinger [place], [place] 02/15/2015 11:55 AM

## 2015-02-16 ENCOUNTER — Encounter: Payer: Self-pay | Admitting: Internal Medicine

## 2015-02-16 ENCOUNTER — Encounter: Payer: Self-pay | Admitting: Cardiology

## 2015-02-16 DIAGNOSIS — N189 Chronic kidney disease, unspecified: Secondary | ICD-10-CM

## 2015-02-16 DIAGNOSIS — N179 Acute kidney failure, unspecified: Secondary | ICD-10-CM

## 2015-02-16 LAB — BASIC METABOLIC PANEL
ANION GAP: 12 (ref 5–15)
BUN: 77 mg/dL — ABNORMAL HIGH (ref 6–20)
CO2: 19 mmol/L — AB (ref 22–32)
Calcium: 8.5 mg/dL — ABNORMAL LOW (ref 8.9–10.3)
Chloride: 105 mmol/L (ref 101–111)
Creatinine, Ser: 2.29 mg/dL — ABNORMAL HIGH (ref 0.44–1.00)
GFR calc Af Amer: 20 mL/min — ABNORMAL LOW (ref >60)
GFR, EST NON AFRICAN AMERICAN: 17 mL/min — AB (ref >60)
GLUCOSE: 98 mg/dL (ref 65–99)
POTASSIUM: 3.5 mmol/L (ref 3.5–5.1)
Sodium: 136 mmol/L (ref 135–145)

## 2015-02-16 LAB — T3, FREE: T3, Free: 1.7 pg/mL — ABNORMAL LOW (ref 2.0–4.4)

## 2015-02-16 MED ORDER — DILTIAZEM HCL 30 MG PO TABS
30.0000 mg | ORAL_TABLET | Freq: Four times a day (QID) | ORAL | Status: DC
  Administered 2015-02-16 – 2015-02-17 (×3): 30 mg via ORAL
  Filled 2015-02-16 (×3): qty 1

## 2015-02-16 NOTE — Progress Notes (Addendum)
SUBJECTIVE: The patient reports fatigue.  Some SOB.  Her primary concern is with dysphagia.  Denies CP.  AF rates remain elevated.  Urinary output is not very good.  Marland Kitchen acetaminophen  500 mg Oral TID  . apixaban  2.5 mg Oral BID  . calcium-vitamin D  1 tablet Oral Q breakfast  . cholecalciferol  1,000 Units Oral Daily  . diltiazem  30 mg Oral 4 times per day  . docusate sodium  100 mg Oral Q12H  . famotidine  20 mg Oral Daily  . feeding supplement (ENSURE ENLIVE)  237 mL Oral BID BM  . levothyroxine  100 mcg Oral QAC breakfast  . loratadine  10 mg Oral Daily  . multivitamin with minerals  1 tablet Oral Daily  . sodium chloride flush  3 mL Intravenous Q12H   . amiodarone 30 mg/hr (02/16/15 0342)    OBJECTIVE: Physical Exam: Filed Vitals:   02/15/15 1719 02/15/15 2014 02/16/15 0031 02/16/15 1143  BP: 95/56 95/59 98/63  85/66  Pulse:  135 129 132  Temp:  97.8 F (36.6 C) 97.5 F (36.4 C) 97.6 F (36.4 C)  TempSrc:  Oral Oral Oral  Resp:  19 18 20   Weight:      SpO2: 100% 96% 98% 96%    Intake/Output Summary (Last 24 hours) at 02/16/15 1423 Last data filed at 02/16/15 1351  Gross per 24 hour  Intake 1454.9 ml  Output    525 ml  Net  929.9 ml    Telemetry reveals afib, V rates 110-130s  GEN- The patient is elderly and frail appearing, alert and oriented x 3 today.   Head- normocephalic, atraumatic Eyes-  Sclera clear, conjunctiva pink Ears- hearing intact Oropharynx- clear Neck- supple, + JVD Lungs- bibasilar rales, normal work of breathing Heart- tachycardic irregular rhythm, 3/6 SEM at the apex GI- soft, NT, ND, + BS Extremities- no clubbing, cyanosis, trace edema Skin- no rash or lesion Psych- euthymic mood, full affect Neuro- strength and sensation are intact  LABS: Basic Metabolic Panel:  Recent Labs  02/14/15 1742  02/15/15 0420 02/16/15 0319  NA  --   < > 137 136  K  --   < > 3.8 3.5  CL  --   < > 106 105  CO2  --   < > 21* 19*  GLUCOSE  --    < > 103* 98  BUN  --   < > 76* 77*  CREATININE  --   < > 2.09* 2.29*  CALCIUM  --   < > 8.8* 8.5*  MG 2.3  --   --   --   < > = values in this interval not displayed. Liver Function Tests:  Recent Labs  02/14/15 02/14/15 1821  AST 23 29  ALT 17 20  ALKPHOS 95 91  BILITOT  --  0.9  PROT  --  6.4*  ALBUMIN  --  3.2*   No results for input(s): LIPASE, AMYLASE in the last 72 hours. CBC:  Recent Labs  02/14/15 02/14/15 1821  WBC 9.4 7.7  NEUTROABS  --  5.3  HGB 11.7* 11.8*  HCT 34* 34.2*  MCV  --  98.8  PLT 282 266   Cardiac Enzymes:  Recent Labs  02/14/15 2233 02/15/15 0420 02/15/15 0957  TROPONINI 0.08* 0.08* 0.07*   Thyroid Function Tests:  Recent Labs  02/14/15 2250 02/15/15 0957  TSH 4.813*  --   T3FREE  --  1.7*   Anemia  Panel: No results for input(s): VITAMINB12, FOLATE, FERRITIN, TIBC, IRON, RETICCTPCT in the last 72 hours.  RADIOLOGY: Dg Chest Port 1 View  02/14/2015  CLINICAL DATA:  Tachycardia starting this morning. Recent bronchitis. EXAM: PORTABLE CHEST 1 VIEW COMPARISON:  09/27/2014 FINDINGS: Defibrillator patch is noted. Atherosclerotic calcification of the aortic arch. Stable mild enlargement of the cardiopericardial silhouette. Upper zone pulmonary vascular prominence is indeterminate due to the semi erect positioning. Bilateral chronic rotator cuff tears with delamination of the acromion humeral space. No significant blunting of the costophrenic angles. IMPRESSION: 1. Stable mild enlargement of the cardiopericardial silhouette. There is upper zone pulmonary vascular prominence which is nonspecific given the semi erect positioning, but which could reflect pulmonary venous hypertension. No overt edema. 2. Bilateral chronic rotator cuff tears with severe degenerative glenohumeral arthropathy bilaterally. 3. Atherosclerotic aortic arch. Electronically Signed   By: Van Clines M.D.   On: 02/14/2015 18:38    ASSESSMENT AND PLAN:  Principal  Problem:   Atrial fibrillation with RVR (HCC) Active Problems:   Chronic kidney disease, stage III (moderate)   Acute on chronic combined systolic and diastolic congestive heart failure, NYHA class 4 (HCC)   Paroxysmal atrial fibrillation with rapid ventricular response (HCC)   CHF (congestive heart failure) ([place])   Esophageal dysmotility   Dysphagia   1. Persistent Atrial Fibrillation with RVR:  Very difficult situation Given severe MR, I am concerned that about ability to maintain sinus long term is low Given hypotension, we have very few options for rate control Will add low dose cardizem Continue amiodarone IV for rate control On eliquis for stroke prevention, though if crcl falls further, we may have to switch to coumadin Would consider TEE guided cardioversion however given dysphagia, would need GI clearance first  2. Acute on Chronic Combined Systolic + Diastolic CHF: EF [id number] in 05/2014. Afib with RVR likely the exacerbating factor of her acute on chronic systolic/diastolic heart failure.  I think that her symptoms may actually improve with rate control and will therefore add cardizem back.  I do not feel that cardizem will worsen CHF but rather improve her symptoms. Given advancing renal failure, will hold diuretics for now and reassess tomorrow  3. Abnormal Troponin: demand ischemic due to RVR No indication for further CV workup  4. Acute on Chronic renal faiulre  Very poor prognostic indicator.  Very few options.  Hold diuretics and reassess tomorrow Check UA.    5. Esophageal Dysmotility Disorder: will ask GI to assess.  Would need GI approval before consideration of TEE guided cardioversion  6. Hypothryodism: may need medicine input if now improved   Prognosis is very poor and options are limited.  Ultimately, a palliative approach may be required.  She is not ready for this. She is very frail and at risk for decompensation.  A high level of decision making was  required for this visit    Thompson Grayer, MD 02/16/2015 2:23 PM   Addendum Spoke with [facility] who recommends barium swallow which will help with their assessment. Study ordered.  They will see patient after results are available.  Thompson Grayer MD, [facility] 02/16/2015 2:36 PM

## 2015-02-17 ENCOUNTER — Inpatient Hospital Stay (HOSPITAL_COMMUNITY): Payer: Medicare Other

## 2015-02-17 DIAGNOSIS — K224 Dyskinesia of esophagus: Secondary | ICD-10-CM

## 2015-02-17 DIAGNOSIS — R1310 Dysphagia, unspecified: Secondary | ICD-10-CM

## 2015-02-17 LAB — BASIC METABOLIC PANEL
ANION GAP: 12 (ref 5–15)
BUN: 84 mg/dL — ABNORMAL HIGH (ref 6–20)
CHLORIDE: 104 mmol/L (ref 101–111)
CO2: 19 mmol/L — AB (ref 22–32)
Calcium: 8.4 mg/dL — ABNORMAL LOW (ref 8.9–10.3)
Creatinine, Ser: 2.71 mg/dL — ABNORMAL HIGH (ref 0.44–1.00)
GFR calc non Af Amer: 14 mL/min — ABNORMAL LOW (ref >60)
GFR, EST AFRICAN AMERICAN: 16 mL/min — AB (ref >60)
GLUCOSE: 96 mg/dL (ref 65–99)
POTASSIUM: 3.7 mmol/L (ref 3.5–5.1)
Sodium: 135 mmol/L (ref 135–145)

## 2015-02-17 MED ORDER — DILTIAZEM HCL 60 MG PO TABS
60.0000 mg | ORAL_TABLET | Freq: Four times a day (QID) | ORAL | Status: AC
  Administered 2015-02-17 – 2015-02-19 (×8): 60 mg via ORAL
  Filled 2015-02-17 (×8): qty 1

## 2015-02-17 NOTE — Progress Notes (Signed)
SUBJECTIVE: The patient reports fatigue.  Some SOB.  Her primary concern is with dysphagia.  Denies CP.  AF rates remain elevated but are improved.  Urinary output is not very good.  Marland Kitchen acetaminophen  500 mg Oral TID  . apixaban  2.5 mg Oral BID  . calcium-vitamin D  1 tablet Oral Q breakfast  . cholecalciferol  1,000 Units Oral Daily  . diltiazem  60 mg Oral 4 times per day  . docusate sodium  100 mg Oral Q12H  . famotidine  20 mg Oral Daily  . feeding supplement (ENSURE ENLIVE)  237 mL Oral BID BM  . levothyroxine  100 mcg Oral QAC breakfast  . loratadine  10 mg Oral Daily  . multivitamin with minerals  1 tablet Oral Daily  . sodium chloride flush  3 mL Intravenous Q12H   . amiodarone 30 mg/hr (02/17/15 0315)    OBJECTIVE: Physical Exam: Filed Vitals:   02/16/15 1642 02/16/15 2004 02/17/15 0000 02/17/15 0350  BP: 89/51 90/60 87/64  106/68  Pulse: 135 134 129 120  Temp: 97.3 F (36.3 C) 98 F (36.7 C)  97.8 F (36.6 C)  TempSrc: Oral Oral  Oral  Resp: 18 18 20 18   Weight:    169 lb 3.1 oz (76.747 kg)  SpO2: 100% 100%  98%    Intake/Output Summary (Last 24 hours) at 02/17/15 [id number] Last data filed at 02/17/15 0910  Gross per 24 hour  Intake  980.4 ml  Output    700 ml  Net  280.4 ml    Telemetry reveals afib, V rates 110-130s again today  GEN- The patient is elderly and frail appearing, alert and oriented x 3 today.   Head- normocephalic, atraumatic Eyes-  Sclera clear, conjunctiva pink Ears- hearing intact Oropharynx- clear Neck- supple, + JVD Lungs- bibasilar rales, normal work of breathing Heart- tachycardic irregular rhythm, 3/6 SEM at the apex GI- soft, NT, ND, + BS Extremities- no clubbing, cyanosis, trace edema Skin- no rash or lesion Psych- euthymic mood, full affect Neuro- strength and sensation are intact  LABS: Basic Metabolic Panel:  Recent Labs  02/14/15 1742  02/16/15 0319 02/17/15 0307  NA  --   < > 136 135  K  --   < > 3.5 3.7  CL   --   < > 105 104  CO2  --   < > 19* 19*  GLUCOSE  --   < > 98 96  BUN  --   < > 77* 84*  CREATININE  --   < > 2.29* 2.71*  CALCIUM  --   < > 8.5* 8.4*  MG 2.3  --   --   --   < > = values in this interval not displayed. Liver Function Tests:  Recent Labs  02/14/15 1821  AST 29  ALT 20  ALKPHOS 91  BILITOT 0.9  PROT 6.4*  ALBUMIN 3.2*   No results for input(s): LIPASE, AMYLASE in the last 72 hours. CBC:  Recent Labs  02/14/15 1821  WBC 7.7  NEUTROABS 5.3  HGB 11.8*  HCT 34.2*  MCV 98.8  PLT 266   Cardiac Enzymes:  Recent Labs  02/14/15 2233 02/15/15 0420 02/15/15 0957  TROPONINI 0.08* 0.08* 0.07*   Thyroid Function Tests:  Recent Labs  02/14/15 2250 02/15/15 0957  TSH 4.813*  --   T3FREE  --  1.7*   Anemia Panel: No results for input(s): VITAMINB12, FOLATE, FERRITIN, TIBC, IRON, RETICCTPCT in the  last 72 hours.  RADIOLOGY: Dg Chest Port 1 View  02/14/2015  CLINICAL DATA:  Tachycardia starting this morning. Recent bronchitis. EXAM: PORTABLE CHEST 1 VIEW COMPARISON:  09/27/2014 FINDINGS: Defibrillator patch is noted. Atherosclerotic calcification of the aortic arch. Stable mild enlargement of the cardiopericardial silhouette. Upper zone pulmonary vascular prominence is indeterminate due to the semi erect positioning. Bilateral chronic rotator cuff tears with delamination of the acromion humeral space. No significant blunting of the costophrenic angles. IMPRESSION: 1. Stable mild enlargement of the cardiopericardial silhouette. There is upper zone pulmonary vascular prominence which is nonspecific given the semi erect positioning, but which could reflect pulmonary venous hypertension. No overt edema. 2. Bilateral chronic rotator cuff tears with severe degenerative glenohumeral arthropathy bilaterally. 3. Atherosclerotic aortic arch. Electronically Signed   By: Van Clines M.D.   On: 02/14/2015 18:38    ASSESSMENT AND PLAN:  Principal Problem:   Atrial  fibrillation with RVR (HCC) Active Problems:   Chronic kidney disease, stage III (moderate)   Acute on chronic combined systolic and diastolic congestive heart failure, NYHA class 4 (HCC)   Paroxysmal atrial fibrillation with rapid ventricular response (HCC)   CHF (congestive heart failure) ([place])   Esophageal dysmotility   Dysphagia   1. Persistent Atrial Fibrillation with RVR:  Very difficult situation Given severe MR, I am concerned that about ability to maintain sinus long term is low Given hypotension, we have very few options for rate control Will increase cardizem to 60mg  daily today.  She has tolerated this very well so far.  Would continue to titrate dose as long as SBP>80 (was 80-90 before I started cardizem). Continue amiodarone IV for rate control and convert to oral in 24 hours On eliquis for stroke prevention, though if crcl falls further, we may have to switch to coumadin Would consider TEE guided cardioversion however given dysphagia, would need GI clearance first.  I have spoken with GI who has recommended barium swallow.   If this study is normal, they feel she could have TEE.  2. Acute on Chronic Combined Systolic + Diastolic CHF: EF [id number] in 05/2014. Afib with RVR likely the exacerbating factor of her acute on chronic systolic/diastolic heart failure.  I think that her symptoms may actually improve with rate control and have therefore added cardizem back.  I do not feel that cardizem will worsen CHF but rather improve her symptoms. Given advancing renal failure, will hold diuretics for now and reassess tomorrow  3. Abnormal Troponin: demand ischemic due to RVR No indication for further CV workup  4. Acute on Chronic renal faiulre  Very poor prognostic indicator.  Very few options.  UA ordered but not yet performed Will order renal US  5. Esophageal Dysmotility Disorder:  Would need GI approval before consideration of TEE guided cardioversion.  Per GI, have ordered  barium swallow.  GI to see after barium swallow.  6. Hypothryodism: may need medicine input if now improved   Prognosis is very poor and options are limited.  Ultimately, a palliative approach may be required.  She is not ready for this. She is very frail and at risk for decompensation.  A high level of decision making was required for this visit    Ann Grayer, MD 02/17/2015 9:28 AM

## 2015-02-17 NOTE — Consult Note (Signed)
ANTICOAGULATION CONSULT NOTE - follow up [facility] for Apixaban Indication: atrial fibrillation  Vital Signs: Temp: 97.8 F (36.6 C) (02/12 0350) Temp Source: Oral (02/12 0350) BP: 106/68 mmHg (02/12 0350) Pulse Rate: 120 (02/12 0350)  Labs:  Recent Labs  02/14/15 1821 02/14/15 2233 02/15/15 0420 02/15/15 0957 02/16/15 0319 02/17/15 0307  HGB 11.8*  --   --   --   --   --   HCT 34.2*  --   --   --   --   --   PLT 266  --   --   --   --   --   CREATININE 2.08*  --  2.09*  --  2.29* 2.71*  TROPONINI  --  0.08* 0.08* 0.07*  --   --     Estimated Creatinine Clearance: 12.4 mL/min (by C-G formula based on Cr of 2.71).   Assessment: 82 yo female w/ new diagnosis of afib starting on Eliquis. No previous anticoag. CHA2DS2 VASc score of 5 (age >40, gender, HTN, CHF). 2/9 CBC hgb 11.8, plt 266. SCr continues to trend upward 2.09>2.29>2.71. Likely require switch to warfarin if trend continues.   Goal of Therapy:  Monitor platelets by anticoagulation protocol: Yes   Plan:  Continue Apixaban 2.5 mg PO BID for now (age > 80 YO and SCr > 1.5) Monitor CBC, s/sx bleeding  Stephens November  PharmD Clinical Pharmacy Resident 02/17/2015 10:49 AM

## 2015-02-17 NOTE — Consult Note (Signed)
Referring Provider:  Cardiology Primary Care Physician:  Estill Dooms, MD Primary Gastroenterologist: unassigned  Reason for Consultation: Dysphagia    HPI: Ann [name] is a 82 y.o. female daily, hypertension, diastolic CHF, mitral regurgitation, and SVT. She currently resides in assisted living at the [facility]. She states that on February 8 while eating turnip greens at 10 or she felt as if it became lodged in her throat. She spit them up and then vomited several times. She reports that she has had difficulty swallowing for quite some time. She relates that in 2008 while hospitalized with pneumonia she was having trouble swallowing and had an esophagram that showed esophageal dysmotility. She states she also had "a test with the speech pathologist" and was told she needed to stay on a dysphagia diet. She states since then she has been avoiding dry foods, crackers, peanuts, etc. because they typically feel as if the large in her upper esophagus and cause her to cough and sputter. More recently over the past several months she will feel as if she has a lump in her throat if she drinks anything cold or has ice cream. She states if she has Jell-O it frequently comes right back up. She says she has a long-standing history of GERD but has an allergy to Prilosec and pantoprazole and so she has been on ranitidine, but despite this gets breakthrough symptoms several days per week. Over the past month or so she has been having worsening symptoms of belching and burping with each meal. Each time she eats she feels as if she gets a lot of air in her esophagus that gets stuck and she has pressure until she can belch several times. She denies epigastric pain. She also states that she has been more constipated. She uses a stool softener at the assisted living facility but despite this does not move her bowels on a daily basis. If she skips several days without a bowel movement she sometimes feels as if her  esophageal symptoms intensified.   Past Medical History  Diagnosis Date  . Mitral regurgitation   . Hypertension   . Palpitations   . Nocturnal dyspnea   . Advanced age   . Chronic diastolic CHF (congestive heart failure) ([place])     Last echo in 5/11  . Pain in joint, lower leg 02/25/2012  . Pain in joint, lower leg 02/23/2012  . Encounter for long-term (current) use of other medications 10/02/2011  . Cough 07/17/2011  . Internal hemorrhoids without mention of complication [id number]  . Unspecified constipation 01/01/2011  . Rash and other nonspecific skin eruption 08/14/2010  . Tension headache 07/31/2010  . Benign paroxysmal positional vertigo 07/24/2010  . Cervicalgia 07/24/2010  . Urinary tract infection, site not specified 07/10/2010  . Abnormality of gait 05/09/2009  . Vitamin D deficiency 01/25/2009  . Lumbago 01/05/2008  . Cervicitis and endocervicitis 12/22/2007  . Actinic keratosis 12/22/2007  . Pain in limb 12/22/2007  . Muscle weakness (generalized) 08/04/2007  . Blepharochalasis 05/12/2007  . Insomnia, unspecified 05/12/2007  . Dermatophytosis of foot 07/14/2006  . Dizziness and giddiness 06/25/2006  . Allergic rhinitis due to pollen 04/21/2006  . Dyskinesia of esophagus 04/12/2006  . Other malaise and fatigue 04/01/2006  . Unspecified urinary incontinence 02/10/2006  . Cramp of limb 10/07/2005  . Restless legs syndrome (RLS) 06/30/2005  . CKD (chronic kidney disease), stage III   . Hypothyroidism   . Diaphragmatic hernia without mention of obstruction or gangrene 10/03/2003  .  Unspecified venous (peripheral) insufficiency 01/05/1998  . Reflux esophagitis 01/06/1996  . Peptic ulcer, unspecified site, unspecified as acute or chronic, without mention of hemorrhage, perforation, or obstruction 01/06/1996  . Diverticulosis of colon (without mention of hemorrhage) 01/06/1996  . Pain in right knee 08/08/2012  . Macular degeneration of right eye 1980's  . Macular degeneration 09/21/2013  . Varicose vein  of leg 02/08/2014  . Mitral valve regurgitation     Moderate-severe by echo 05/2014  . Systolic and diastolic CHF, chronic ([place])     Past Surgical History  Procedure Laterality Date  . Tonsillectomy and adenoidectomy  1940  . Appendectomy  1940's  . Oophorectomy  1940's  . Breast surgery  1950's    benign tumors removed  . Cataract extraction w/ intraocular lens  implant, bilateral  1982  . Lesion excision  04/2002    melanomas of scalp  Dr. Link Snuffer  . Skin cancer excision  2005    BCC of right temple  . Cholecystectomy, laparoscopic  10/2004    Dr. Deon Pilling  . Tooth extraction  06/2008    and bone implant  Dr. Matilde Haymaker  . Colonoscopy  2000    Prior to Admission medications   Medication Sig Start Date End Date Taking? Authorizing Provider  acetaminophen (TYLENOL) 500 MG tablet Take 1 tablet (500 mg total) by mouth every 4 (four) hours as needed for mild pain or moderate pain. Patient taking differently: Take 500 mg by mouth 3 (three) times daily.  12/07/14  Yes Harvel Quale, MD  Artificial Saliva (BIOTENE MOISTURIZING MOUTH MT) Use as directed in the mouth or throat. Use as directed   Yes Historical Provider, MD  aspirin EC 81 MG tablet Take 81 mg by mouth daily.   Yes Historical Provider, MD  Calcium & Magnesium Carbonates (MYLANTA PO) Take 30 mLs by mouth 4 (four) times daily as needed (indigestion).    Yes Historical Provider, MD  Calcium Carbonate Antacid 400 MG CHEW Chew 800 mg by mouth every 4 (four) hours as needed (heartburn).   Yes Historical Provider, MD  calcium-vitamin D (OSCAL WITH D) 500-200 MG-UNIT tablet Take 1 tablet by mouth daily with breakfast.   Yes Historical Provider, MD  cetirizine (ZYRTEC) 10 MG tablet Take 5 mg by mouth daily.    Yes Historical Provider, MD  Cholecalciferol (VITAMIN D3) 1000 UNITS CAPS Take 1 capsule by mouth daily.    Yes Historical Provider, MD  docusate sodium (COLACE) 100 MG capsule Take 1 capsule (100 mg total) by mouth every 12  (twelve) hours. 09/27/14  Yes Courteney Lyn Mackuen, MD  fluticasone (FLONASE) 50 MCG/ACT nasal spray Place 1 spray into both nostrils daily.    Yes Historical Provider, MD  levothyroxine (SYNTHROID, LEVOTHROID) 100 MCG tablet Take 100 mcg by mouth daily before breakfast. 11/18/14  Yes Historical Provider, MD  menthol-cetylpyridinium (CEPACOL) 3 MG lozenge Take 1 lozenge by mouth every 2 (two) hours as needed for sore throat.   Yes Historical Provider, MD  metoprolol (LOPRESSOR) 50 MG tablet Take 50 mg by mouth 2 (two) times daily.   Yes Historical Provider, MD  multivitamin [facility]) per tablet Take 1 tablet by mouth daily.     Yes Historical Provider, MD  Polyethyl Glycol-Propyl Glycol (SYSTANE) 0.4-0.3 % SOLN Apply 1 drop to eye daily as needed (dry eyes).   Yes Historical Provider, MD  polyvinyl alcohol (LIQUIFILM TEARS) 1.4 % ophthalmic solution Place 1 drop into both eyes 4 (four) times daily as needed  for dry eyes.   Yes Historical Provider, MD  promethazine (PHENERGAN) 25 MG suppository Place 25 mg rectally every 6 (six) hours as needed for nausea or vomiting.   Yes Historical Provider, MD  ranitidine (ZANTAC) 150 MG tablet Take 150 mg by mouth 2 (two) times daily.   Yes Historical Provider, MD  spironolactone (ALDACTONE) 25 MG tablet Take 25 mg by mouth daily.   Yes Historical Provider, MD  torsemide (DEMADEX) 20 MG tablet Take one tablet daily to help regulate and prevent edema Patient taking differently: Take 20 mg by mouth daily. Take one tablet daily to help regulate and prevent edema 10/18/14  Yes Estill Dooms, MD  losartan (COZAAR) 25 MG tablet Take 25 mg by mouth daily.    Historical Provider, MD    Current Facility-Administered Medications  Medication Dose Route Frequency Provider Last Rate Last Dose  . 0.9 %  sodium chloride infusion  250 mL Intravenous PRN Rhonda G Barrett, PA-C      . acetaminophen (TYLENOL) tablet 500 mg  500 mg Oral TID Evelene Croon Barrett, PA-C   500 mg at  02/17/15 1033  . acetaminophen (TYLENOL) tablet 650 mg  650 mg Oral Q4H PRN Rhonda G Barrett, PA-C      . ALPRAZolam Duanne Moron) tablet 0.25 mg  0.25 mg Oral BID PRN Evelene Croon Barrett, PA-C      . amiodarone (NEXTERONE PREMIX) 360 MG/200ML (1.8 mg/mL) IV infusion  30 mg/hr Intravenous Continuous Rhonda G Barrett, PA-C 16.7 mL/hr at 02/17/15 0315 30 mg/hr at 02/17/15 0315  . antiseptic oral rinse (BIOTENE) solution 15 mL  15 mL Mouth Rinse Q2H PRN Skeet Simmer, RPH      . apixaban (ELIQUIS) tablet 2.5 mg  2.5 mg Oral BID Roma Schanz, RPH   2.5 mg at 02/17/15 1033  . calcium-vitamin D (OSCAL WITH D) 500-200 MG-UNIT per tablet 1 tablet  1 tablet Oral Q breakfast Lonn Georgia, PA-C   1 tablet at 02/17/15 0846  . cholecalciferol (VITAMIN D) tablet 1,000 Units  1,000 Units Oral Daily Evelene Croon Barrett, PA-C   1,000 Units at 02/17/15 1033  . diltiazem (CARDIZEM) tablet 60 mg  60 mg Oral 4 times per day Thompson Grayer, MD   60 mg at 02/17/15 1036  . docusate sodium (COLACE) capsule 100 mg  100 mg Oral Q12H Rhonda G Barrett, PA-C   100 mg at 02/17/15 0845  . famotidine (PEPCID) tablet 20 mg  20 mg Oral Daily Donalynn Furlong Buckeye, RPH   20 mg at 02/17/15 1033  . feeding supplement (ENSURE ENLIVE) (ENSURE ENLIVE) liquid 237 mL  237 mL Oral BID BM Brittainy M Simmons, PA-C   237 mL at 02/15/15 1137  . fluticasone (FLONASE) 50 MCG/ACT nasal spray 1 spray  1 spray Each Nare Daily PRN Rhonda G Barrett, PA-C      . levothyroxine (SYNTHROID, LEVOTHROID) tablet 100 mcg  100 mcg Oral QAC breakfast Evelene Croon Barrett, PA-C   100 mcg at 02/17/15 0846  . loratadine (CLARITIN) tablet 10 mg  10 mg Oral Daily Evelene Croon Barrett, PA-C   10 mg at 02/17/15 1033  . menthol-cetylpyridinium (CEPACOL) lozenge 3 mg  1 lozenge Oral Q2H PRN Rhonda G Barrett, PA-C      . multivitamin with minerals tablet 1 tablet  1 tablet Oral Daily Evelene Croon Barrett, PA-C   1 tablet at 02/17/15 1033  . nitroGLYCERIN (NITROSTAT) SL tablet 0.4 mg  0.4 mg  Sublingual Q5  Min x 3 PRN Evelene Croon Barrett, PA-C      . ondansetron (ZOFRAN) injection 4 mg  4 mg Intravenous Q6H PRN Rhonda G Barrett, PA-C      . polyvinyl alcohol (LIQUIFILM TEARS) 1.4 % ophthalmic solution 1 drop  1 drop Both Eyes QID PRN Rhonda G Barrett, PA-C      . sodium chloride flush (NS) 0.9 % injection 3 mL  3 mL Intravenous Q12H Rhonda G Barrett, PA-C   3 mL at 02/16/15 1037  . sodium chloride flush (NS) 0.9 % injection 3 mL  3 mL Intravenous PRN Rhonda G Barrett, PA-C      . zolpidem (AMBIEN) tablet 5 mg  5 mg Oral QHS PRN Evelene Croon Barrett, PA-C   5 mg at 02/16/15 2129    Allergies as of 02/14/2015 - Review Complete 02/14/2015  Allergen Reaction Noted  . Protonix [pantoprazole sodium] Rash 03/29/2013  . Ace inhibitors Cough 04/16/2010  . Amoxicillin Diarrhea 05/24/2014  . Biaxin [clarithromycin]  05/24/2014  . Codeine  04/17/2010  . Darvon  04/17/2010  . Ibuprofen Other (See Comments) 05/24/2014  . Mercurochrome [merbromin (mercurochrome)]  06/06/2010  . Pantoprazole  09/08/2011  . Prilosec [omeprazole] Other (See Comments) 05/24/2014  . Septra [sulfamethoxazole-trimethoprim]  09/27/2014  . Sulfa drugs cross reactors  04/17/2010  . Tramadol  04/22/2011  . Vesicare [solifenacin]  03/29/2013    Family History  Problem Relation Age of Onset  . Stroke Mother   . Heart disease Father   . Diabetes Father   . Heart disease Brother     MI  . Alcohol abuse Sister     Social History   Social History  . Marital Status: Widowed    Spouse Name: N/A  . Number of Children: N/A  . Years of Education: N/A   Occupational History  . retired Sales promotion account executive    Social History Main Topics  . Smoking status: Never Smoker   . Smokeless tobacco: Never Used  . Alcohol Use: No  . Drug Use: No  . Sexual Activity: No   Other Topics Concern  . Not on file   Social History Narrative   Lives at [facility] since 2008   Widowed 2006   Walks with walker           Review of Systems: Per history of present illness, otherwise negative.  Physical Exam: Vital signs in last 24 hours: Temp:  [97.3 F (36.3 C)-98 F (36.7 C)] 97.8 F (36.6 C) (02/12 0350) Pulse Rate:  [120-135] 120 (02/12 0350) Resp:  [18-20] 18 (02/12 0350) BP: (85-106)/(51-68) 106/68 mmHg (02/12 0350) SpO2:  [96 %-100 %] 98 % (02/12 0350) Weight:  [169 lb 3.1 oz (76.747 kg)] 169 lb 3.1 oz (76.747 kg) (02/12 0350) Last BM Date: 02/17/15 General:   Alert,  Well-developed, well-nourished, pleasant and cooperative in NAD Head:  Normocephalic and atraumatic. Eyes:  Sclera clear, no icterus.   Conjunctiva pink. Ears:  Normal auditory acuity. Nose:  No deformity, discharge,  or lesions. Mouth:  No deformity or lesions.   Neck:  Supple; no masses or thyromegaly. Lungs:  Clear throughout to auscultation.     Heart:  Regular rate and rhythm. 1/6 murmur. Abdomen:  Soft,nontender, BS active,nonpalp mass or hsm.   Rectal:  Deferred  Msk:  Symmetrical without gross deformities. . Pulses:  Normal pulses noted. Extremities: 1+ lower extremity edema Neurologic: Alert and  oriented x4;  grossly normal neurologically. Skin:  Intact without significant lesions  or rashes.. Psych:  Alert and cooperative. Normal mood and affect.  Intake/Output from previous day: 02/11 0701 - 02/12 0700 In: 920.4 [P.O.:720; I.V.:200.4] Out: 700 [Urine:700] Intake/Output this shift: Total I/O In: 420 [P.O.:420] Out: 250 [Urine:250]  Lab Results:  Recent Labs  02/14/15 1821  WBC 7.7  HGB 11.8*  HCT 34.2*  PLT 266   BMET  Recent Labs  02/15/15 0420 02/16/15 0319 02/17/15 0307  NA 137 136 135  K 3.8 3.5 3.7  CL 106 105 104  CO2 21* 19* 19*  GLUCOSE 103* 98 96  BUN 76* 77* 84*  CREATININE 2.09* 2.29* 2.71*  CALCIUM 8.8* 8.5* 8.4*   LFT  Recent Labs  02/14/15 1821  PROT 6.4*  ALBUMIN 3.2*  AST 29  ALT 20  ALKPHOS 91  BILITOT 0.9    Studies/Results: Esophogram  04/06/2006: Clinical Data: Pneumonia, dehydration, and weakness. Hiatal hernia. BARIUM ESOPHAGRAM: Findings: The pharynx appears normal without evidence of mass. No aspiration was visualized. The esophagus is markedly patulous with very poor primary stripping wave and tertiary contractions identified. No mass or stricture is seen. There is no evidence of inflammatory change. A 13 mm barium tablet passes readily into the stomach. The patient has a small hiatal hernia. No gastroesophageal reflux was elicited on the examination. IMPRESSION:  Marked esophageal dysmotility with patulous esophagus.  IMPRESSION/PLAN: 82 year old female with a history of marked esophageal dysmotility and a patulous esophagus, now with complaints of worsening dysphagia. Patient describes a sensation of food sticking in her throat and" wanting to go down the wrong pipe".? If there may be a degree of transfer dysphagia. Would continue ranitidine. Esophagram has been ordered. Will also order a modified barium swallow with speech pathology.  A. fib with RVR Chronic combined systolic and diastolic CHF  acute on chronic renal failure GERD History esophageal dysmotility Hypothyroidism   Hvozdovic, Vita Barley PA-C 02/17/2015,  Pager [phone]  Mon-Fri 8a-5p [phone] after 5p, weekends, holidays   Attending physician's note   I have taken a history, examined the patient and reviewed the chart. I agree with the Advanced Practitioner's note, impression and recommendations. Her symptoms seems to be mostly related to oropharyngeal dysphagia, well follow up the results of modified barium along with the esophagram to rule out any esophageal strictures. If she has a significant esophageal stricture have to consider EGD with dilation before she could have the TEE.   Damaris Hippo, MD [phone] Mon-Fri  8a-5p [phone] after 5p, weekends, holidays

## 2015-02-18 LAB — URINALYSIS, ROUTINE W REFLEX MICROSCOPIC
BILIRUBIN URINE: NEGATIVE
Glucose, UA: NEGATIVE mg/dL
KETONES UR: NEGATIVE mg/dL
Nitrite: NEGATIVE
PH: 5 (ref 5.0–8.0)
Protein, ur: NEGATIVE mg/dL
SPECIFIC GRAVITY, URINE: 1.018 (ref 1.005–1.030)

## 2015-02-18 LAB — CBC
HCT: 32.7 % — ABNORMAL LOW (ref 36.0–46.0)
Hemoglobin: 11.2 g/dL — ABNORMAL LOW (ref 12.0–15.0)
MCH: 33.6 pg (ref 26.0–34.0)
MCHC: 34.3 g/dL (ref 30.0–36.0)
MCV: 98.2 fL (ref 78.0–100.0)
PLATELETS: 240 10*3/uL (ref 150–400)
RBC: 3.33 MIL/uL — AB (ref 3.87–5.11)
RDW: 14.9 % (ref 11.5–15.5)
WBC: 7.1 10*3/uL (ref 4.0–10.5)

## 2015-02-18 LAB — BASIC METABOLIC PANEL
Anion gap: 11 (ref 5–15)
BUN: 88 mg/dL — AB (ref 6–20)
CALCIUM: 8.5 mg/dL — AB (ref 8.9–10.3)
CHLORIDE: 102 mmol/L (ref 101–111)
CO2: 19 mmol/L — AB (ref 22–32)
CREATININE: 2.87 mg/dL — AB (ref 0.44–1.00)
GFR calc non Af Amer: 13 mL/min — ABNORMAL LOW (ref >60)
GFR, EST AFRICAN AMERICAN: 15 mL/min — AB (ref >60)
Glucose, Bld: 98 mg/dL (ref 65–99)
Potassium: 3.6 mmol/L (ref 3.5–5.1)
Sodium: 132 mmol/L — ABNORMAL LOW (ref 135–145)

## 2015-02-18 LAB — URINE MICROSCOPIC-ADD ON

## 2015-02-18 MED ORDER — METOPROLOL TARTRATE 12.5 MG HALF TABLET
12.5000 mg | ORAL_TABLET | Freq: Two times a day (BID) | ORAL | Status: DC
  Administered 2015-02-18 – 2015-02-20 (×5): 12.5 mg via ORAL
  Filled 2015-02-18 (×5): qty 1

## 2015-02-18 MED ORDER — AMIODARONE HCL 200 MG PO TABS
400.0000 mg | ORAL_TABLET | Freq: Two times a day (BID) | ORAL | Status: DC
  Administered 2015-02-18 – 2015-02-19 (×2): 400 mg via ORAL
  Filled 2015-02-18 (×2): qty 2

## 2015-02-18 MED ORDER — MAGIC MOUTHWASH W/LIDOCAINE
5.0000 mL | Freq: Three times a day (TID) | ORAL | Status: DC | PRN
  Administered 2015-02-18 – 2015-02-19 (×2): 5 mL via ORAL
  Filled 2015-02-18 (×3): qty 5

## 2015-02-18 MED ORDER — LEVOTHYROXINE SODIUM 88 MCG PO TABS
88.0000 ug | ORAL_TABLET | Freq: Every day | ORAL | Status: DC
  Administered 2015-02-19 – 2015-02-22 (×4): 88 ug via ORAL
  Filled 2015-02-18 (×4): qty 1

## 2015-02-18 NOTE — Progress Notes (Signed)
Cardiology paged.  

## 2015-02-18 NOTE — Progress Notes (Signed)
15: 36: The speech therapist called me to report that the radiology department did not have the patient on their worklist for the swallow study today. Apparently, they could not see the order that was placed on 02/16/15, and they said it is too late to do the Barium swallow today. Ms Delano Metz is notified. The "DG Esophagus" for the swallowing test is re-ordered.  16:15: I called the radiology department to confirm that they can see the order for tomorrow, and they said they can see the order.

## 2015-02-18 NOTE — Progress Notes (Signed)
Pt. With BP 95/55. On call for [facility], [place], paged. Pt. Has scheduled BP meds. RN will continue to monitor pt. Ann [name], Katherine Roan

## 2015-02-18 NOTE — Progress Notes (Signed)
UR Completed. Quashawn Jewkes, RN, BSN.  [phone] 

## 2015-02-18 NOTE — Evaluation (Addendum)
Clinical/Bedside Swallow Evaluation Patient Details  Name: NHYIRA CONLIFFE MRN: [id number] Date of Birth: [birth year]  Today's Date: 02/18/2015 Time: SLP Start Time (ACUTE ONLY): 0919 SLP Stop Time (ACUTE ONLY): 0951 SLP Time Calculation (min) (ACUTE ONLY): 32 min  Past Medical History:  Past Medical History  Diagnosis Date  . Mitral regurgitation   . Hypertension   . Palpitations   . Nocturnal dyspnea   . Advanced age   . Chronic diastolic CHF (congestive heart failure) ([place])     Last echo in 5/11  . Pain in joint, lower leg 02/25/2012  . Pain in joint, lower leg 02/23/2012  . Encounter for long-term (current) use of other medications 10/02/2011  . Cough 07/17/2011  . Internal hemorrhoids without mention of complication [id number]  . Unspecified constipation 01/01/2011  . Rash and other nonspecific skin eruption 08/14/2010  . Tension headache 07/31/2010  . Benign paroxysmal positional vertigo 07/24/2010  . Cervicalgia 07/24/2010  . Urinary tract infection, site not specified 07/10/2010  . Abnormality of gait 05/09/2009  . Vitamin D deficiency 01/25/2009  . Lumbago 01/05/2008  . Cervicitis and endocervicitis 12/22/2007  . Actinic keratosis 12/22/2007  . Pain in limb 12/22/2007  . Muscle weakness (generalized) 08/04/2007  . Blepharochalasis 05/12/2007  . Insomnia, unspecified 05/12/2007  . Dermatophytosis of foot 07/14/2006  . Dizziness and giddiness 06/25/2006  . Allergic rhinitis due to pollen 04/21/2006  . Dyskinesia of esophagus 04/12/2006  . Other malaise and fatigue 04/01/2006  . Unspecified urinary incontinence 02/10/2006  . Cramp of limb 10/07/2005  . Restless legs syndrome (RLS) 06/30/2005  . CKD (chronic kidney disease), stage III   . Hypothyroidism   . Diaphragmatic hernia without mention of obstruction or gangrene 10/03/2003  . Unspecified venous (peripheral) insufficiency 01/05/1998  . Reflux esophagitis 01/06/1996  . Peptic ulcer, unspecified site, unspecified as acute or chronic, without  mention of hemorrhage, perforation, or obstruction 01/06/1996  . Diverticulosis of colon (without mention of hemorrhage) 01/06/1996  . Pain in right knee 08/08/2012  . Macular degeneration of right eye 1980's  . Macular degeneration 09/21/2013  . Varicose vein of leg 02/08/2014  . Mitral valve regurgitation     Moderate-severe by echo 05/2014  . Systolic and diastolic CHF, chronic (HCC)    Past Surgical History:  Past Surgical History  Procedure Laterality Date  . Tonsillectomy and adenoidectomy  1940  . Appendectomy  1940's  . Oophorectomy  1940's  . Breast surgery  1950's    benign tumors removed  . Cataract extraction w/ intraocular lens  implant, bilateral  1982  . Lesion excision  04/2002    melanomas of scalp  Dr. Link Snuffer  . Skin cancer excision  2005    BCC of right temple  . Cholecystectomy, laparoscopic  10/2004    Dr. Deon Pilling  . Tooth extraction  06/2008    and bone implant  Dr. Matilde Haymaker  . Colonoscopy  2000   HPI:  ALAYSSA SALVATORI is a 82 y.o. female with a history of reflux, esophagitis, cervicalgia, SVT, MR, D-CHF, esophageal dysmotility on barium esophagram 2008, CKD, HTN, allergic to Lasix, who lives in [facility]isted Living. CXR Stable mild enlargement of the cardiopericardial silhouette.There is upper zone pulmonary vascular prominence which is nonspecific given the semi erect positioning, but which could reflect pulmonary venous hypertension. No overt edema. Admitted with relatively sudden onset of dysphagia with  vomiting and states she cannot swallow solid foods and swallowing liquids better.   Assessment / Plan / Recommendation Clinical  Impression  Pt presents with an esophageal based dysphagia with possible pharyngeal component. Swallow marked by facial grimace, increased effort, audible and delayed throat clear. Pt became increasingly uncormfortable with need to eructate and inability to do so until approximately 15 second to 2 minute delay. Pt scheduled for baruim  esophagram today. Family and pt in agreement to downgrade to full liquids for ease of esophageal transit. Provided verbal education of GER compensatory strategies. ST to follow up next date for po tolerance and need for objective assessment.     Aspiration Risk  Moderate aspiration risk    Diet Recommendation Thin liquid (full liquids)   Liquid Administration via: Cup;No straw Medication Administration: Whole meds with puree (crush if large) Supervision: Patient able to self feed Compensations: Slow rate;Small sips/bites;Follow solids with liquid Postural Changes: Seated upright at 90 degrees;Remain upright for at least 30 minutes after po intake    Other  Recommendations Oral Care Recommendations: Oral care BID   Follow up Recommendations  None    Frequency and Duration min 2x/week  2 weeks       Prognosis Prognosis for Safe Diet Advancement: Good      Swallow Study   General HPI: NANNA JOURNIGAN is a 82 y.o. female with a history of reflux, esophagitis, cervicalgia, SVT, MR, D-CHF, esophageal dysmotility on barium esophagram 2008, CKD, HTN, allergic to Lasix, who lives in [place]. CXR Stable mild enlargement of the cardiopericardial silhouette.There is upper zone pulmonary vascular prominence which is nonspecific given the semi erect positioning, but which could reflect pulmonary venous hypertension. No overt edema. Admitted with relatively sudden onset of dysphagia with  vomiting and states she cannot swallow solid foods and swallowing liquids better. Type of Study: Bedside Swallow Evaluation Previous Swallow Assessment:  (MBS 2008, no results available) Diet Prior to this Study: Regular;Thin liquids Temperature Spikes Noted: No Respiratory Status: Room air History of Recent Intubation: No Behavior/Cognition: Alert;Cooperative;Pleasant mood Oral Cavity Assessment: Within Functional Limits Oral Care Completed by SLP: No Oral Cavity - Dentition: Adequate natural dentition  (bridge upper, lower) Vision: Functional for self-feeding Self-Feeding Abilities: Able to feed self Patient Positioning: Upright in bed Baseline Vocal Quality: Normal Volitional Cough: Strong Volitional Swallow: Able to elicit    Oral/Motor/Sensory Function Overall Oral Motor/Sensory Function: Within functional limits   Ice Chips Ice chips: Within functional limits   Thin Liquid Thin Liquid: Impaired Presentation: Cup Pharyngeal  Phase Impairments: Multiple swallows;Throat Clearing - Immediate;Throat Clearing - Delayed (effortful and audible swallow )    Nectar Thick Nectar Thick Liquid: Not tested   Honey Thick Honey Thick Liquid: Not tested   Puree Puree: Impaired Pharyngeal Phase Impairments:  (effortful and audible swallow )   Solid   GO   Solid: Not tested        [name] 02/18/2015,10:07 AM  Orbie Pyo Colvin Caroli.Ed [employer] [phone]

## 2015-02-18 NOTE — Progress Notes (Signed)
Hospital Problem List     Principal Problem:   Atrial fibrillation with RVR (HCC) Active Problems:   Chronic kidney disease, stage III (moderate)   Acute on chronic combined systolic and diastolic congestive heart failure, NYHA class 4 (HCC)   Paroxysmal atrial fibrillation with rapid ventricular response (HCC)   CHF (congestive heart failure) ([place])   Esophageal dysmotility   Dysphagia    Patient Profile:   Primary Cardiologist: Dr. Mare Ferrari  82 yo female w/ PMH of SVT, MR, combined systolic and diastolic CHF, CKD, and HTN admitted on 02/14/2015 for acute on chronic combined CHF and new onset atrial fibrillation with RVR.  Subjective   Main complaint today is her continuing dysphagia. Reports "feeling awful". Also mentions having "white spots" along the back of her mouth, concerning for thrush. Denies any chest pain, palpitations, or dyspnea. Able to tolerate full liquid diet today. Consuming soup without difficulty at the time of this encounter.  Inpatient Medications    . acetaminophen  500 mg Oral TID  . apixaban  2.5 mg Oral BID  . calcium-vitamin D  1 tablet Oral Q breakfast  . cholecalciferol  1,000 Units Oral Daily  . diltiazem  60 mg Oral 4 times per day  . docusate sodium  100 mg Oral Q12H  . famotidine  20 mg Oral Daily  . feeding supplement (ENSURE ENLIVE)  237 mL Oral BID BM  . levothyroxine  100 mcg Oral QAC breakfast  . loratadine  10 mg Oral Daily  . multivitamin with minerals  1 tablet Oral Daily  . sodium chloride flush  3 mL Intravenous Q12H    Vital Signs    Filed Vitals:   02/18/15 0024 02/18/15 0554 02/18/15 0848 02/18/15 1216  BP: 91/62 95/55 100/73 104/70  Pulse: 101 106 93 114  Temp: 98 F (36.7 C) 97.6 F (36.4 C) 97.5 F (36.4 C) 97.4 F (36.3 C)  TempSrc: Oral Oral Oral Axillary  Resp: 18 19 28 22   Weight:      SpO2: 95% 96% 90% 91%    Intake/Output Summary (Last 24 hours) at 02/18/15 1231 Last data filed at 02/18/15 1120  Gross per 24 hour  Intake 1040.47 ml  Output    476 ml  Net 564.47 ml   Filed Weights   02/14/15 2120 02/15/15 0533 02/17/15 0350  Weight: 168 lb 8 oz (76.431 kg) 167 lb (75.751 kg) 169 lb 3.1 oz (76.747 kg)    Physical Exam    General: [place] Caucasian female, currently in no acute distress. Head: Normocephalic, atraumatic.  Neck: Supple without bruits, JVD mildly elevated. Lungs:  Resp regular and unlabored, rales at bases bilaterally. Heart: Tachycardiac, irregularly irregular, S1, S2, no S3, S4, 3/6 SEM most appreciated at apex; no rub. Abdomen: Soft, non-tender, non-distended with normoactive bowel sounds. No hepatomegaly. No rebound/guarding. No obvious abdominal masses. Extremities: No clubbing, cyanosis, or edema. Distal pedal pulses are 2+ bilaterally. Neuro: Alert and oriented X 3. Moves all extremities spontaneously. Psych: Normal affect.  Labs    CBC  Recent Labs  02/18/15 0412  WBC 7.1  HGB 11.2*  HCT 32.7*  MCV 98.2  PLT A999333   Basic Metabolic Panel  Recent Labs  02/17/15 0307 02/18/15 0412  NA 135 132*  K 3.7 3.6  CL 104 102  CO2 19* 19*  GLUCOSE 96 98  BUN 84* 88*  CREATININE 2.71* 2.87*  CALCIUM 8.4* 8.5*    Telemetry    Atrial fibrillation, HR in  110's - 120's. No atopic events.  ECG    No new tracings.   Cardiac Studies and Radiology    US Renal: 02/17/2015  CLINICAL DATA:  Acute renal failure EXAM: RENAL / URINARY TRACT ULTRASOUND COMPLETE COMPARISON:  None. FINDINGS: Right Kidney: Length: 8.6 cm. Echogenicity within normal limits. No mass or hydronephrosis visualized. Left Kidney: Length: 8.4 cm. Echogenicity within normal limits. No mass or hydronephrosis visualized. Bladder: Decompressed by Foley catheter. IMPRESSION: No acute abnormality noted. Electronically Signed   By: Inez Catalina M.D.   On: 02/17/2015 18:38   Dg Chest Port 1 View2/09/2015  CLINICAL DATA:  Tachycardia starting this morning. Recent bronchitis. EXAM:  PORTABLE CHEST 1 VIEW COMPARISON:  09/27/2014 FINDINGS: Defibrillator patch is noted. Atherosclerotic calcification of the aortic arch. Stable mild enlargement of the cardiopericardial silhouette. Upper zone pulmonary vascular prominence is indeterminate due to the semi erect positioning. Bilateral chronic rotator cuff tears with delamination of the acromion humeral space. No significant blunting of the costophrenic angles. IMPRESSION: 1. Stable mild enlargement of the cardiopericardial silhouette. There is upper zone pulmonary vascular prominence which is nonspecific given the semi erect positioning, but which could reflect pulmonary venous hypertension. No overt edema. 2. Bilateral chronic rotator cuff tears with severe degenerative glenohumeral arthropathy bilaterally. 3. Atherosclerotic aortic arch. Electronically Signed   By: Van Clines M.D.   On: 02/14/2015 18:38    Echocardiogram: 05/25/2014 Study Conclusions - Left ventricle: Septal and posterior basal hypokinesis. The cavity size was mildly dilated. Wall thickness was normal. Systolic function was mildly to moderately reduced. The estimated ejection fraction was in the range of 40% to 45%. Doppler parameters are consistent with elevated ventricular end-diastolic filling pressure. - Aortic valve: Mild systolic gradient with no stenosis. Valve area (Vmax): 1.46 cm^2. - Mitral valve: Moderate to severe MR Likely ischemic with restricted posterior leaflet motion. - Left atrium: The atrium was moderately dilated. - Atrial septum: No defect or patent foramen ovale was identified. - Pulmonary arteries: PA peak pressure: 37 mm Hg (S). - Impressions: Consider TEE to further assess degree of MR.  Impressions: - Consider TEE to further assess degree of MR.  Assessment & Plan    1. Persistent Atrial Fibrillation with RVR  - Given severe MR, there is concern about her ability to maintain sinus rhythm long-term is low. -  This patients CHA2DS2-VASc Score and unadjusted Ischemic Stroke Rate (% per year) is equal to 7.2 % stroke rate/year from a score of 5 (CHF, HTN, Female, Age (2)). Continue Eliquis for anticoagulation. Reduced dosing of 2.5mg  BID due to CKD and age. - started on Cardizem 60mg  QID. BP has been 91/55 - 106/73. Would hold off on converting to Cardizem CD with soft BP's.  - currently on IV Amiodarone. Would convert to PO with 400mg  BID for 1 week, followed by 200mg  BID for 1 week, then 200mg  daily. - for Barium swallow today to evaluate her dysphagia. If this study is normal, they feel she could have TEE.  2. Acute on Chronic Combined Systolic + Diastolic CHF - EF A999333 in 05/2014. Afib with RVR likely the exacerbating factor of her acute on chronic systolic/diastolic heart failure. - Weight 168 lbs on admission. 169 lbs on 02/17/2015. Is +2.4L this admission. - allergic to Lasix. Was on Torsemide 20mg  BID but this is currently being held due to worsening kidney function.  3. Abnormal Troponin - cyclic troponin values were 0.08, 0.08, and 0.07. - likely to represent demand ischemic due to RVR -  No indication for further CV workup at this time.  4. Acute on Chronic Stage 3 CKD - creatinine 1.9 on admission.  - creatinine elevated to 2.87 on 02/18/2015. Diuretics have been held the past two days. - Renal US showed no acute abnormalities.  5. Esophageal Dysmotility Disorder/ Thrush - For barium swallow study today.GI to see after the study is performed.  - Appreciate GI recommendations.  6. Hypothryodism - TSH 4.813 on admission. T4 elevated to 1.44. - will decrease Synthroid dose from 165mcg to 109mcg.  Signed, Erma Heritage , PA-C 12:31 PM 02/18/2015 Pager: [phone]  I have seen and examined the patient along with Erma Heritage , PA-C.  I have reviewed the chart, notes and new data.  I agree with PA's note.  Key new complaints: frustrated with tongue soreness and sensation  of food sticking/need to burp Key examination changes: tachycardia at rest, no rales/no JVD, very faint ejection murmur Key new findings / data: pending swallow study; creat worse, now GFR approx 13  PLAN: Rate control would be the optimal strategy in this nonagenarian, but we are limited by her BP. Afraid to add digoxin due to poor and volatile renal function. Will add a tiny dose of beta blocker. We might still be compelled to proceed to TEE/DCCV, but I would prefer to wait for 3 weeks of anticoagulation if she does not decompensate hemodynamically.  Sanda Klein, MD, [place] [phone] 02/18/2015, 1:46 PM

## 2015-02-18 NOTE — Care Management Important Message (Signed)
Important Message  Patient Details  Name: Ann [name] MRN: [id number] Date of Birth: 1918-12-16   Medicare Important Message Given:  Yes    Jacayla Nordell P Cambryn Charters 02/18/2015, 1:37 PM

## 2015-02-19 ENCOUNTER — Inpatient Hospital Stay (HOSPITAL_COMMUNITY): Payer: Medicare Other

## 2015-02-19 MED ORDER — DILTIAZEM HCL ER COATED BEADS 120 MG PO TB24
240.0000 mg | ORAL_TABLET | Freq: Every day | ORAL | Status: DC
  Administered 2015-02-20: 240 mg via ORAL
  Filled 2015-02-19 (×2): qty 2

## 2015-02-19 MED ORDER — AMIODARONE HCL 200 MG PO TABS
400.0000 mg | ORAL_TABLET | Freq: Every day | ORAL | Status: DC
  Administered 2015-02-20 – 2015-02-22 (×3): 400 mg via ORAL
  Filled 2015-02-19 (×3): qty 2

## 2015-02-19 NOTE — Progress Notes (Signed)
Patient Name: Ann [name] Date of Encounter: 02/19/2015  Principal Problem:   Atrial fibrillation with RVR ([place]) Active Problems:   Chronic kidney disease, stage III (moderate)   Acute on chronic combined systolic and diastolic congestive heart failure, NYHA class 4 (HCC)   Paroxysmal atrial fibrillation with rapid ventricular response (HCC)   CHF (congestive heart failure) ([place])   Esophageal dysmotility   Dysphagia   Length of Stay: 5  SUBJECTIVE  Feels "terrible" - she had a long morning with her swallow test. Ventricular rate control is much improved. Weight today is clearly erroneous.  CURRENT MEDS . acetaminophen  500 mg Oral TID  . amiodarone  400 mg Oral BID  . apixaban  2.5 mg Oral BID  . calcium-vitamin D  1 tablet Oral Q breakfast  . cholecalciferol  1,000 Units Oral Daily  . diltiazem  60 mg Oral 4 times per day  . docusate sodium  100 mg Oral Q12H  . famotidine  20 mg Oral Daily  . feeding supplement (ENSURE ENLIVE)  237 mL Oral BID BM  . levothyroxine  88 mcg Oral QAC breakfast  . loratadine  10 mg Oral Daily  . metoprolol tartrate  12.5 mg Oral BID  . multivitamin with minerals  1 tablet Oral Daily  . sodium chloride flush  3 mL Intravenous Q12H    OBJECTIVE   Intake/Output Summary (Last 24 hours) at 02/19/15 1333 Last data filed at 02/19/15 0200  Gross per 24 hour  Intake    480 ml  Output    102 ml  Net    378 ml   Filed Weights   02/15/15 0533 02/17/15 0350 02/19/15 0711  Weight: 75.751 kg (167 lb) 76.747 kg (169 lb 3.1 oz) 65.772 kg (145 lb)    PHYSICAL EXAM Filed Vitals:   02/18/15 2159 02/19/15 0631 02/19/15 0711 02/19/15 1300  BP: 99/60 99/83 111/75 149/85  Pulse: 122 115 119 98  Temp:   98.7 F (37.1 C) 98.6 F (37 C)  TempSrc:   Oral Oral  Resp: 20  22 20   Weight:   65.772 kg (145 lb)   SpO2: 94%  96% 96%   General: Frail-appearing Caucasian female, currently in no acute distress. Head: Normocephalic, atraumatic. Neck:  Supple without bruits, JVD mildly elevated. Lungs: Resp regular and unlabored, rales at bases bilaterally. Heart: Tachycardiac, irregularly irregular, S1, S2, no S3, S4, 3/6 SEM most appreciated at apex; no rub. Abdomen: Soft, non-tender, non-distended with normoactive bowel sounds. No hepatomegaly. No rebound/guarding. No obvious abdominal masses. Extremities: No clubbing, cyanosis, or edema. Distal pedal pulses are 2+ bilaterally. Neuro: Alert and oriented X 3. Moves all extremities spontaneously. Psych: Normal affect.  LABS  CBC  Recent Labs  02/18/15 0412  WBC 7.1  HGB 11.2*  HCT 32.7*  MCV 98.2  PLT A999333   Basic Metabolic Panel  Recent Labs  02/17/15 0307 02/18/15 0412  NA 135 132*  K 3.7 3.6  CL 104 102  CO2 19* 19*  GLUCOSE 96 98  BUN 84* 88*  CREATININE 2.71* 2.87*  CALCIUM 8.4* 8.5*   Radiology Studies Dg Esophagus  02/19/2015  CLINICAL DATA:  Dysphagia. EXAM: ESOPHOGRAM/BARIUM SWALLOW TECHNIQUE: Single contrast examination was performed using  thin barium. FLUOROSCOPY TIME:  Radiation Exposure Index (as provided by the fluoroscopic device): 174 microGy*m^2 If the device does not provide the exposure index: Fluoroscopy Time:  dictate in minutes and seconds Number of Acquired Images: COMPARISON:  Report from 04/06/2006 FINDINGS: The  patient was able to perform a few individual swallows of barium well laying in the LPO position. The patient was acutely short of breath during today's exam. Her family member attributed this to anxiety. The patient was on a 2 L per minute nasal cannula. The patient was unable to readily assume various positions and accordingly the entire exam was performed in the LPO position. Upon individual swallows of barium, there was disruption of primary peristaltic waves in the mid thoracic esophagus on all swallows. The patient experienced coughing and choking while swallowing. Small type 1 hiatal hernia. contrast slowly extended through the distal  esophagus and hiatal hernia into the intra-abdominal portion of the stomach. The distal esophagus appears smooth but I was only able to distended up to about 19 mm (image 34, series 3). In the hiatal hernia portion, I was only able to distend the hernia up to about 9 mm. This is because of the esophageal dysmotility. Strictly speaking there could be some narrowing in the distal esophagus which could go on diagnosed due to the lack of ability to fully distend the esophagus distally. The patient swallowed a 13 mm barium tablet in applesauce. Due to the dysmotility, this did not extend beyond the mid to distal thoracic esophagus during several minutes of observation. The tablet is expected to dissolve over time. IMPRESSION: 1. Exam consisted of multiple swallows performed in the LPO position due to limited patient mobility and shortness of breath despite the use of the nasal cannula for oxygen. 2. Nonspecific esophageal dysmotility disorder, with disruption of all swallows in the mid thoracic esophagus. 3. Small type 1 hiatal hernia. 4. Maximum distention of the distal esophagus was about 19 mm and maximum distention in the vicinity of the hiatal hernia was about 9 mm. Thin barium was noted to slowly percolated through this region but because of the dysmotility, and the patient's difficulty with swallowing, the region cannot be fully distended. Accordingly, narrowing in this area cannot be totally excluded. 5. 13 mm barium tablet was static in the mid to distal esophagus over several minutes of observation, attributed to dysmotility. 6. The patient did experience several episodes of coughing while trying to drink from the straw. She was tilted up about 25-30 degrees during swallowing. Electronically Signed   By: Van Clines M.D.   On: 02/19/2015 09:57   US Renal  02/17/2015  CLINICAL DATA:  Acute renal failure EXAM: RENAL / URINARY TRACT ULTRASOUND COMPLETE COMPARISON:  None. FINDINGS: Right Kidney: Length:  8.6 cm. Echogenicity within normal limits. No mass or hydronephrosis visualized. Left Kidney: Length: 8.4 cm. Echogenicity within normal limits. No mass or hydronephrosis visualized. Bladder: Decompressed by Foley catheter. IMPRESSION: No acute abnormality noted. Electronically Signed   By: Inez Catalina M.D.   On: 02/17/2015 18:38    TELE AFib, ventricular rate 80-90    ASSESSMENT AND PLAN  1. Persistent Atrial Fibrillation with RVR  - Given severe MR, there is concern about her ability to maintain sinus rhythm long-term is low. - This patients CHA2DS2-VASc Score and unadjusted Ischemic Stroke Rate (% per year) is equal to 7.2 % stroke rate/year from a score of 5 (CHF, HTN, Female, Age (2)). Continue Eliquis for anticoagulation. Reduced dosing of 2.5mg  BID due to CKD and age. - Barium swallow appears to show a distal esophageal functional obstruction rather than a true stricture. If necessary, she could have TEE and we could even stop at the mid-esophageal level. - Rate is now well controlled and I hope  that we can avoid cardioversion altogether, since this has low chance of long term sinus rhythm maintenance. If we really need to proceed to DCCV, I would just wait for 3 weeks of anticoagulation to avoid TEE.  2. Acute on Chronic Combined Systolic + Diastolic CHF - EF A999333 in 05/2014. Afib with RVR likely the exacerbating factor of her acute on chronic systolic/diastolic heart failure. - Weight 168 lbs on admission. 169 lbs on 02/17/2015. Is +2.4L this admission. - allergic to Lasix. Was on Torsemide 20mg  BID but this is currently being held due to worsening kidney function. Creatinine seems to have reached plateau, may be able to restart diuretics tomorrow.  3. Abnormal Troponin - cyclic troponin values were 0.08, 0.08, and 0.07. - likely to represent demand ischemic due to RVR - No indication for further CV workup at this time.  4. Acute on Chronic Stage 3 CKD - creatinine 1.9 on  admission.  - creatinine elevated to 2.87 on 02/18/2015. Diuretics have been held the past two days. - Renal US showed no acute abnormalities.  5. Esophageal Dysmotility Disorder/ Thrush  - For barium swallow study today.GI to see after the study is performed.  - Appreciate GI recommendations.  6. Hypothryodism - TSH 4.813 on admission. T4 elevated to 1.44. - have decreased Synthroid dose from 177mcg to 59mcg.  PT evaluation in anticipation of DC next 24-48 hours.   Sanda Klein, MD, [facility] [phone] office [phone] pager 02/19/2015 1:33 PM '

## 2015-02-19 NOTE — Care Management Note (Signed)
Case Management Note  Patient Details  Name: Ann [name] MRN: [id number] Date of Birth: 1918-02-04  Subjective/Objective:  Pt admitted with combined CHF, new onset A fib and chronic dysphagia                  Action/Plan:  Pt is from [place], CSW consulted and following pt.   CM will continue to follow for discharge needs   Expected Discharge Date:                  Expected Discharge Plan:  Assisted Living / Rest Home  In-House Referral:  Clinical Social Work  Discharge planning Services  CM Consult  Post Acute Care Choice:    Choice offered to:     DME Arranged:    DME Agency:     HH Arranged:    [place] Agency:     Status of Service:  In process, will continue to follow  Medicare Important Message Given:  Yes Date Medicare IM Given:    Medicare IM give by:    Date Additional Medicare IM Given:    Additional Medicare Important Message give by:     If discussed at [place] of Stay Meetings, dates discussed:  02/19/15  Additional Comments:  Maryclare Labrador, RN 02/19/2015, 1:41 PM

## 2015-02-19 NOTE — Progress Notes (Signed)
I reviewed the patient's barium esophagram report and images.  Not surprisingly for this elderly patient, it is a suboptimal study.  The findings are most consistent with a motility disorder rather than a mechanical stricture.  The risks of EGD outweigh the expected benefits.  If TEE is strongly indicated, proceed with caution.

## 2015-02-19 NOTE — Progress Notes (Signed)
Speech Language Pathology Treatment: Dysphagia  Patient Details Name: Ann [name] MRN: [id number] DOB: Jan 13, 1918 Today's Date: 02/19/2015 Time: [id number] SLP Time Calculation (min) (ACUTE ONLY): 34 min  Assessment / Plan / Recommendation Clinical Impression  Pt had barium swallow completed this morning which showed esophageal dysmotility. Pt's daughter reports improved tolerance of POs since transitioning to full liquid diet, particularly with emphasis on liquids. SLP provided skilled observation of thin liquids, which elicit eructation and intermittent dry throat clearing - suspect related to esophageal issues. Pt became anxious and upset when trying to consume large amounts of pills crushed in puree, which filled an entire container of applesauce. Immediate coughing and changes in vocal quality not observed throughout. Discussed possibility of completing MBS with pt and daughter - at this time, the pt is adamant that she does not want further testing and daughter is in agreement to follow her wishes. SLP reiterated esophageal and aspiration precautions, which would be used to reduce but not eliminate the risk of aspiration. They verbalized their understanding and are in agreement for SLP to continue to follow acutely.   HPI HPI: Ann [name] is a 82 y.o. female with a history of reflux, esophagitis, cervicalgia, SVT, MR, D-CHF, esophageal dysmotility on barium esophagram 2008, CKD, HTN, allergic to Lasix, who lives in Assisted Living. CXR Stable mild enlargement of the cardiopericardial silhouette.There is upper zone pulmonary vascular prominence which is nonspecific given the semi erect positioning, but which could reflect pulmonary venous hypertension. No overt edema. Admitted with relatively sudden onset of dysphagia with  vomiting and states she cannot swallow solid foods and swallowing liquids better.      SLP Plan  Continue with current plan of care     Recommendations  Diet  recommendations: Other(comment);Thin liquid (full liquid) Liquids provided via: Cup Medication Administration: Whole meds with puree (crush larger pills) Supervision: Patient able to self feed;Full supervision/cueing for compensatory strategies Compensations: Slow rate;Small sips/bites;Follow solids with liquid Postural Changes and/or Swallow Maneuvers: Seated upright 90 degrees;Upright 30-60 min after meal             Oral Care Recommendations: Oral care BID Follow up Recommendations: None Plan: Continue with current plan of care     GO               Germain Osgood, M.A. CCC-SLP [phone]  Germain Osgood 02/19/2015, 12:18 PM

## 2015-02-19 NOTE — NC FL2 (Signed)
[place] LEVEL OF CARE SCREENING TOOL     IDENTIFICATION  Patient Name: Ann [name] Birthdate: [birth year] Sex: female Admission Date (Current Location): 02/14/2015  [facility] and [place] Number:  Herbalist and Address:  [facility], [place] [address] [place], [place] [address]      Provider Number: [id number]  Attending Physician Name and Address:  Arnoldo Lenis, MD  Relative Name and Phone Number:   (daughter Lovey Newcomer bateman))    Current Level of Care: Hospital Recommended Level of Care: [place] Prior Approval Number:    Date Approved/Denied:   PASRR Number:    Discharge Plan: SNF    Current Diagnoses: Patient Active Problem List   Diagnosis Date Noted  . Esophageal dysmotility 02/15/2015  . Dysphagia 02/15/2015  . Paroxysmal supraventricular tachycardia ([place]) 02/14/2015  . Nausea with vomiting 02/14/2015  . Acute on chronic combined systolic and diastolic congestive heart failure, NYHA class 4 ([place]) 02/14/2015  . Paroxysmal atrial fibrillation with rapid ventricular response ([place]) 02/14/2015  . CHF (congestive heart failure) ([place]) 02/14/2015  . Atrial fibrillation with RVR ([place])   . Constipation 10/24/2014  . Musculoskeletal chest pain 09/27/2014  . Generalized anxiety disorder 07/16/2014  . Right knee pain 06/14/2014  . Dyspnea 06/14/2014  . Systolic and diastolic CHF, chronic ([place]) 05/27/2014  . Mitral valve regurgitation 05/26/2014  . Bunion of great toe 02/08/2014  . Varicose vein of leg 02/08/2014  . Macular degeneration 09/21/2013  . Atrophic vaginitis 07/13/2013  . Urinary incontinence 09/22/2012  . Hypertension   . Diplopia 09/12/2012  . Chronic kidney disease, stage III (moderate) 08/08/2012  . PVC's (premature ventricular contractions) 09/08/2011  . Hypothyroidism 04/17/2010  . Spinal stenosis, lumbar 04/17/2010  . Abnormality of gait 05/09/2009  . Reflux esophagitis 01/06/1996   . Peptic ulcer, unspecified site, unspecified as acute or chronic, without mention of hemorrhage, perforation, or obstruction 01/06/1996    Orientation RESPIRATION BLADDER Height & Weight     Self, Time, Situation, Place  Normal Continent Weight: 145 lb (65.772 kg) Height:     BEHAVIORAL SYMPTOMS/MOOD NEUROLOGICAL BOWEL NUTRITION STATUS      Continent Diet (regular)  AMBULATORY STATUS COMMUNICATION OF NEEDS Skin   Limited Assist Verbally Normal                       Personal Care Assistance Level of Assistance  Bathing, Feeding, Dressing Bathing Assistance: Limited assistance Feeding assistance: Limited assistance Dressing Assistance: Limited assistance     Functional Limitations Info  Hearing, Speech, Sight Sight Info: Adequate Hearing Info: Adequate Speech Info: Adequate    SPECIAL CARE FACTORS FREQUENCY                       Contractures      Additional Factors Info  Code Status Code Status Info: partial             Current Medications (02/19/2015):  This is the current hospital active medication list Current Facility-Administered Medications  Medication Dose Route Frequency Provider Last Rate Last Dose  . 0.9 %  sodium chloride infusion  250 mL Intravenous PRN Rhonda G Barrett, PA-C      . acetaminophen (TYLENOL) tablet 500 mg  500 mg Oral TID Evelene Croon Barrett, PA-C   500 mg at 02/19/15 1008  . acetaminophen (TYLENOL) tablet 650 mg  650 mg Oral Q4H PRN Rhonda G Barrett, PA-C      .  ALPRAZolam Duanne Moron) tablet 0.25 mg  0.25 mg Oral BID PRN Evelene Croon Barrett, PA-C   0.25 mg at 02/17/15 2139  . [START ON 02/20/2015] amiodarone (PACERONE) tablet 400 mg  400 mg Oral Daily Luke K Kilroy, PA-C      . antiseptic oral rinse (BIOTENE) solution 15 mL  15 mL Mouth Rinse Q2H PRN Skeet Simmer, RPH      . apixaban (ELIQUIS) tablet 2.5 mg  2.5 mg Oral BID Roma Schanz, RPH   2.5 mg at 02/19/15 1009  . calcium-vitamin D (OSCAL WITH D) 500-200 MG-UNIT per tablet 1  tablet  1 tablet Oral Q breakfast Evelene Croon Barrett, PA-C   1 tablet at 02/19/15 1008  . cholecalciferol (VITAMIN D) tablet 1,000 Units  1,000 Units Oral Daily Evelene Croon Barrett, PA-C   1,000 Units at 02/19/15 1009  . [START ON 02/20/2015] diltiazem (CARDIZEM LA) 24 hr tablet 240 mg  240 mg Oral Daily Luke K Kilroy, PA-C      . diltiazem (CARDIZEM) tablet 60 mg  60 mg Oral 4 times per day Erlene Quan, PA-C   60 mg at 02/19/15 0636  . docusate sodium (COLACE) capsule 100 mg  100 mg Oral Q12H Rhonda G Barrett, PA-C   100 mg at 02/19/15 1008  . famotidine (PEPCID) tablet 20 mg  20 mg Oral Daily Donalynn Furlong [place], RPH   20 mg at 02/19/15 1009  . feeding supplement (ENSURE ENLIVE) (ENSURE ENLIVE) liquid 237 mL  237 mL Oral BID BM Brittainy M Simmons, PA-C   237 mL at 02/19/15 1009  . fluticasone (FLONASE) 50 MCG/ACT nasal spray 1 spray  1 spray Each Nare Daily PRN Rhonda G Barrett, PA-C      . levothyroxine (SYNTHROID, LEVOTHROID) tablet 88 mcg  88 mcg Oral QAC breakfast Erma Heritage, [place]   88 mcg at 02/19/15 1008  . loratadine (CLARITIN) tablet 10 mg  10 mg Oral Daily Rhonda G Barrett, PA-C   10 mg at 02/19/15 1009  . magic mouthwash w/lidocaine  5 mL Oral TID PRN Sanda Klein, MD   5 mL at 02/19/15 1226  . menthol-cetylpyridinium (CEPACOL) lozenge 3 mg  1 lozenge Oral Q2H PRN Rhonda G Barrett, PA-C      . metoprolol tartrate (LOPRESSOR) tablet 12.5 mg  12.5 mg Oral BID Mihai Croitoru, MD   12.5 mg at 02/19/15 1008  . multivitamin with minerals tablet 1 tablet  1 tablet Oral Daily Evelene Croon Barrett, PA-C   1 tablet at 02/19/15 1008  . nitroGLYCERIN (NITROSTAT) SL tablet 0.4 mg  0.4 mg Sublingual Q5 Min x 3 PRN Rhonda G Barrett, PA-C      . ondansetron (ZOFRAN) injection 4 mg  4 mg Intravenous Q6H PRN Rhonda G Barrett, PA-C      . polyvinyl alcohol (LIQUIFILM TEARS) 1.4 % ophthalmic solution 1 drop  1 drop Both Eyes QID PRN Rhonda G Barrett, PA-C      . sodium chloride flush (NS) 0.9 % injection 3  mL  3 mL Intravenous Q12H Rhonda G Barrett, PA-C   3 mL at 02/19/15 1000  . sodium chloride flush (NS) 0.9 % injection 3 mL  3 mL Intravenous PRN Rhonda G Barrett, PA-C      . zolpidem (AMBIEN) tablet 5 mg  5 mg Oral QHS PRN Evelene Croon Barrett, PA-C   5 mg at 02/16/15 2129     Discharge Medications: Please see discharge summary for a list of discharge  medications.  Relevant Imaging Results:  Relevant Lab Results:   Additional Information SS# [id number]  [place] intern  [phone]

## 2015-02-19 NOTE — Clinical Social Work Note (Signed)
Clinical Social Work Assessment  Patient Details  Name: Ann [name] MRN: [id number] Date of Birth: [birth year]  Date of referral:  02/19/15               Reason for consult:  Discharge Planning                Permission sought to share information with:  Facility Sport and exercise psychologist, Family Supports Permission granted to share information::  Yes, Verbal Permission Granted  Name::        Agency::     Relationship::     Contact Information:     Housing/Transportation Living arrangements for the past 2 months:  [place] (but is switch to SNF) Source of Information:  Patient, Adult Children (daughter [phone])) Patient Interpreter Needed:  None Criminal Activity/Legal Involvement Pertinent to Current Situation/Hospitalization:  No - Comment as needed Significant Relationships:  Adult Children Lives with:  Facility Resident Do you feel safe going back to the place where you live?  Yes Need for family participation in patient care:  Yes (Comment)  Care giving concerns: Patient switching from [facility] to SNF at [facility]   Social Worker assessment / plan:   BSW intern entered patient room, patient was alert and orientated with daughter by bedside. BSW intern explained what PT recommend that patient might have to switch from ASL to short term SNF. Patient and patient daughter are agreeable. Patient is requesting private room. Patient does not want [place] and requesting a medicare bed. Patient daughter states that she will be able to provide transportation for her mom upon discharging if medically stable.     Employment status:  Disabled (Comment on whether or not currently receiving Disability) Insurance information:  Programmer, applications [facility]) PT Recommendations:  [place] / Referral to community resources:  [place]  Patient/Family's Response to care:  Patient and patient daughter has a positive intake on her mom  switch from ASL to SNF for rehab until her strength is better.  Patient/Family's Understanding of and Emotional Response to Diagnosis, Current Treatment, and Prognosis:  Patient and patient daughter has a good understanding of mom current condition and current treatment plan.   Emotional Assessment Appearance:  Appears stated age Attitude/Demeanor/Rapport:   (welcoming, nice) Affect (typically observed):  Pleasant, Calm Orientation:  Oriented to Self, Oriented to Place, Oriented to  Time, Oriented to Situation Alcohol / Substance use:  Never Used Psych involvement (Current and /or in the community):  No (Comment)  Discharge Needs  Concerns to be addressed:  No discharge needs identified Readmission within the last 30 days:  No Current discharge risk:  None Barriers to Discharge:  No Barriers Identified   [place] intern  [phone]

## 2015-02-20 ENCOUNTER — Inpatient Hospital Stay (HOSPITAL_COMMUNITY): Payer: Medicare Other

## 2015-02-20 DIAGNOSIS — I10 Essential (primary) hypertension: Secondary | ICD-10-CM | POA: Diagnosis present

## 2015-02-20 DIAGNOSIS — N289 Disorder of kidney and ureter, unspecified: Secondary | ICD-10-CM

## 2015-02-20 DIAGNOSIS — N189 Chronic kidney disease, unspecified: Secondary | ICD-10-CM

## 2015-02-20 DIAGNOSIS — Z7901 Long term (current) use of anticoagulants: Secondary | ICD-10-CM

## 2015-02-20 DIAGNOSIS — I255 Ischemic cardiomyopathy: Secondary | ICD-10-CM | POA: Diagnosis present

## 2015-02-20 DIAGNOSIS — I340 Nonrheumatic mitral (valve) insufficiency: Secondary | ICD-10-CM

## 2015-02-20 LAB — DIFFERENTIAL
Basophils Absolute: 0 10*3/uL (ref 0.0–0.1)
Basophils Relative: 0 %
EOS ABS: 0.1 10*3/uL (ref 0.0–0.7)
EOS PCT: 1 %
LYMPHS ABS: 1 10*3/uL (ref 0.7–4.0)
LYMPHS PCT: 17 %
MONO ABS: 0.6 10*3/uL (ref 0.1–1.0)
MONOS PCT: 9 %
Neutro Abs: 4.6 10*3/uL (ref 1.7–7.7)
Neutrophils Relative %: 73 %

## 2015-02-20 LAB — BASIC METABOLIC PANEL
Anion gap: 12 (ref 5–15)
BUN: 112 mg/dL — ABNORMAL HIGH (ref 6–20)
CO2: 20 mmol/L — ABNORMAL LOW (ref 22–32)
Calcium: 8.7 mg/dL — ABNORMAL LOW (ref 8.9–10.3)
Chloride: 99 mmol/L — ABNORMAL LOW (ref 101–111)
Creatinine, Ser: 4.1 mg/dL — ABNORMAL HIGH (ref 0.44–1.00)
GFR calc Af Amer: 10 mL/min — ABNORMAL LOW (ref >60)
GFR calc non Af Amer: 8 mL/min — ABNORMAL LOW (ref >60)
Glucose, Bld: 101 mg/dL — ABNORMAL HIGH (ref 65–99)
Potassium: 4.4 mmol/L (ref 3.5–5.1)
Sodium: 131 mmol/L — ABNORMAL LOW (ref 135–145)

## 2015-02-20 LAB — BRAIN NATRIURETIC PEPTIDE: B Natriuretic Peptide: 827.9 pg/mL — ABNORMAL HIGH (ref 0.0–100.0)

## 2015-02-20 MED ORDER — SODIUM BICARBONATE 650 MG PO TABS
1300.0000 mg | ORAL_TABLET | Freq: Two times a day (BID) | ORAL | Status: DC
  Administered 2015-02-20 – 2015-02-22 (×4): 1300 mg via ORAL
  Filled 2015-02-20 (×4): qty 2

## 2015-02-20 MED ORDER — CLOTRIMAZOLE 10 MG MT TROC
10.0000 mg | Freq: Every day | OROMUCOSAL | Status: DC
  Administered 2015-02-20 – 2015-02-22 (×5): 10 mg via ORAL
  Filled 2015-02-20 (×13): qty 1

## 2015-02-20 NOTE — Care Management Note (Signed)
Case Management Note  Patient Details  Name: Ann [name] MRN: [id number] Date of Birth: Aug 21, 1918  Subjective/Objective: Awaiting PT/OT recommendation to determine if can return to AL or will need higher Level of care per CSW in [place] this am.                    Action/Plan:Discharge has been deferred to [place]. CM will assist with disposition as needed.    Expected Discharge Date:                  Expected Discharge Plan:  Assisted Living / Rest Home  In-House Referral:  Clinical Social Work  Discharge planning Services  CM Consult  Post Acute Care Choice:    Choice offered to:     DME Arranged:    DME Agency:     HH Arranged:    HH Agency:     Status of Service:  In process, will continue to follow  Medicare Important Message Given:  Yes Date Medicare IM Given:    Medicare IM give by:    Date Additional Medicare IM Given:    Additional Medicare Important Message give by:     If discussed at [place] of Stay Meetings, dates discussed:    Additional Comments:  Delrae Sawyers, RN 02/20/2015, 10:41 AM

## 2015-02-20 NOTE — Progress Notes (Signed)
Speech Language Pathology Treatment:    Patient Details Name: Ann [name] MRN: [id number] DOB: [birth year] Today's Date: 02/20/2015 Time:  -     Assessment / Plan / Recommendation Clinical Impression  Pt describes discomfort as inability to burp and perseverates on belief it is caused by ulcer on lateral tongue. SLP explained/educated esophagus is etiology. Globus sensation present after thin liquids, however less than previously. SLP/pt/daughter discussed full liquid texture. Pt requesting higher texture food which is reasonable to let her attempt mechanical soft with family chopping as needed. Texture upgraded and ST to follow up.    HPI HPI: Ann [name] is a 82 y.o. female with a history of reflux, esophagitis, cervicalgia, SVT, MR, D-CHF, esophageal dysmotility on barium esophagram 2008, CKD, HTN, allergic to Lasix, who lives in Assisted Living. CXR Stable mild enlargement of the cardiopericardial silhouette.There is upper zone pulmonary vascular prominence which is nonspecific given the semi erect positioning, but which could reflect pulmonary venous hypertension. No overt edema. Admitted with relatively sudden onset of dysphagia with  vomiting and states she cannot swallow solid foods and swallowing liquids better.      SLP Plan  Continue with current plan of care     Recommendations  Liquids provided via: Cup;No straw Medication Administration: Whole meds with puree Supervision: Patient able to self feed;Full supervision/cueing for compensatory strategies Compensations: Slow rate;Small sips/bites;Follow solids with liquid (more frequent smaller meals ) Postural Changes and/or Swallow Maneuvers: Seated upright 90 degrees;Upright 30-60 min after meal             Oral Care Recommendations: Oral care BID Follow up Recommendations: None Plan: Continue with current plan of care     GO                Ann [name] 02/20/2015, 3:33 PM  lumbosacral

## 2015-02-20 NOTE — Consult Note (Signed)
Reason for Consult:AKI/CKD4  Referring Physician: Dr. Roxy Manns is an 82 y.o. female.  HPI: 82 yr female with a complex PMH,see below but relevantly, long term HTN, CHFand many allergies. Admitted after aspiration episode, accompanied by Afib with RVR and recurrent hypotension. Was on Losartan at [facility].  Past Crs 1.4-2.1 with GFR 20-30.  Now Cr progressively risen over the last 6 d 2.1 to 4.1 .  Accompanied by oliguria and mild acidemia.   Has experienced recurrent hypotension during this hospitalization and still receiving Cardiazem and Lopressor (also on Amio for rate).  CXR shows some vol xs, wgt up about 2 kg and I&O pos about 2.5 liters.    She does not know of renal dz but daughter does.  No FH of renal dz and no NSAIDs.  Recent AB exposure. Constitutional: feels terrible, no appetite Eyes: glasses , Macular degen, limited vision Ears, nose, mouth, throat, and face: sore tongue, and throat Respiratory: mild SOB Cardiovascular: SOB with minimal exertion, edema mod Gastrointestinal: prob swallowing, constip Genitourinary:some incontinence Integument/breast: itching on back Musculoskeletal:severe arthritis in knees, and spinal stenosis Allergic/Immunologic: see above    Past Medical History  Diagnosis Date  . Mitral regurgitation   . Hypertension   . Palpitations   . Nocturnal dyspnea   . Advanced age   . Chronic diastolic CHF (congestive heart failure) ([place])     Last echo in 5/11  . Pain in joint, lower leg 02/25/2012  . Pain in joint, lower leg 02/23/2012  . Encounter for long-term (current) use of other medications 10/02/2011  . Cough 07/17/2011  . Internal hemorrhoids without mention of complication 10/62/6948  . Unspecified constipation 01/01/2011  . Rash and other nonspecific skin eruption 08/14/2010  . Tension headache 07/31/2010  . Benign paroxysmal positional vertigo 07/24/2010  . Cervicalgia 07/24/2010  . Urinary tract infection, site not specified 07/10/2010  .  Abnormality of gait 05/09/2009  . Vitamin D deficiency 01/25/2009  . Lumbago 01/05/2008  . Cervicitis and endocervicitis 12/22/2007  . Actinic keratosis 12/22/2007  . Pain in limb 12/22/2007  . Muscle weakness (generalized) 08/04/2007  . Blepharochalasis 05/12/2007  . Insomnia, unspecified 05/12/2007  . Dermatophytosis of foot 07/14/2006  . Dizziness and giddiness 06/25/2006  . Allergic rhinitis due to pollen 04/21/2006  . Dyskinesia of esophagus 04/12/2006  . Other malaise and fatigue 04/01/2006  . Unspecified urinary incontinence 02/10/2006  . Cramp of limb 10/07/2005  . Restless legs syndrome (RLS) 06/30/2005  . CKD (chronic kidney disease), stage III   . Hypothyroidism   . Diaphragmatic hernia without mention of obstruction or gangrene 10/03/2003  . Unspecified venous (peripheral) insufficiency 01/05/1998  . Reflux esophagitis 01/06/1996  . Peptic ulcer, unspecified site, unspecified as acute or chronic, without mention of hemorrhage, perforation, or obstruction 01/06/1996  . Diverticulosis of colon (without mention of hemorrhage) 01/06/1996  . Pain in right knee 08/08/2012  . Macular degeneration of right eye 1980's  . Macular degeneration 09/21/2013  . Varicose vein of leg 02/08/2014  . Mitral valve regurgitation     Moderate-severe by echo 05/2014  . Systolic and diastolic CHF, chronic ([place])     Past Surgical History  Procedure Laterality Date  . Tonsillectomy and adenoidectomy  1940  . Appendectomy  1940's  . Oophorectomy  1940's  . Breast surgery  1950's    benign tumors removed  . Cataract extraction w/ intraocular lens  implant, bilateral  1982  . Lesion excision  04/2002    melanomas of  scalp  Dr. Link Snuffer  . Skin cancer excision  2005    BCC of right temple  . Cholecystectomy, laparoscopic  10/2004    Dr. Deon Pilling  . Tooth extraction  06/2008    and bone implant  Dr. Matilde Haymaker  . Colonoscopy  2000    Family History  Problem Relation Age of Onset  . Stroke Mother   . Heart disease  Father   . Diabetes Father   . Heart disease Brother     MI  . Alcohol abuse Sister     Social History:  reports that she has never smoked. She has never used smokeless tobacco. She reports that she does not drink alcohol or use illicit drugs.  Allergies:  Allergies  Allergen Reactions  . Protonix [Pantoprazole Sodium] Rash  . Ace Inhibitors Cough  . Amoxicillin Diarrhea  . Biaxin [Clarithromycin]     Noted on info sheet from independent living  . Codeine   . Darvon   . Ibuprofen Other (See Comments)    Noted on info sheet from independent living  . Mercurochrome [Merbromin (Mercurochrome)]   . Pantoprazole     Dizzy  . Prilosec [Omeprazole] Other (See Comments)    Noted on info sheet from independent living  . Septra [Sulfamethoxazole-Trimethoprim]   . Sulfa Drugs Cross Reactors   . Tramadol     Abnormal kidney function  . Vesicare [Solifenacin]     Drys out nose and throat     Medications:  I have reviewed the patient's current medications. Prior to Admission:  Prescriptions prior to admission  Medication Sig Dispense Refill Last Dose  . acetaminophen (TYLENOL) 500 MG tablet Take 1 tablet (500 mg total) by mouth every 4 (four) hours as needed for mild pain or moderate pain. (Patient taking differently: Take 500 mg by mouth 3 (three) times daily. ) 30 tablet 0 02/14/2015 at Unknown time  . Artificial Saliva (BIOTENE MOISTURIZING MOUTH MT) Use as directed in the mouth or throat. Use as directed   unknown  . aspirin EC 81 MG tablet Take 81 mg by mouth daily.   02/14/2015 at Unknown time  . Calcium & Magnesium Carbonates (MYLANTA PO) Take 30 mLs by mouth 4 (four) times daily as needed (indigestion).    over 30 days  . Calcium Carbonate Antacid 400 MG CHEW Chew 800 mg by mouth every 4 (four) hours as needed (heartburn).   over 30 days  . calcium-vitamin D (OSCAL WITH D) 500-200 MG-UNIT tablet Take 1 tablet by mouth daily with breakfast.   02/14/2015 at Unknown time  . cetirizine  (ZYRTEC) 10 MG tablet Take 5 mg by mouth daily.    unknown  . Cholecalciferol (VITAMIN D3) 1000 UNITS CAPS Take 1 capsule by mouth daily.    02/14/2015 at Unknown time  . docusate sodium (COLACE) 100 MG capsule Take 1 capsule (100 mg total) by mouth every 12 (twelve) hours. 30 capsule 0 02/14/2015 at Unknown time  . fluticasone (FLONASE) 50 MCG/ACT nasal spray Place 1 spray into both nostrils daily.    over 30 days  . levothyroxine (SYNTHROID, LEVOTHROID) 100 MCG tablet Take 100 mcg by mouth daily before breakfast.   02/14/2015 at Unknown time  . menthol-cetylpyridinium (CEPACOL) 3 MG lozenge Take 1 lozenge by mouth every 2 (two) hours as needed for sore throat.   over 30 days  . metoprolol (LOPRESSOR) 50 MG tablet Take 50 mg by mouth 2 (two) times daily.   not yet started  .  multivitamin (THERAGRAN) per tablet Take 1 tablet by mouth daily.     02/14/2015 at Unknown time  . Polyethyl Glycol-Propyl Glycol (SYSTANE) 0.4-0.3 % SOLN Apply 1 drop to eye daily as needed (dry eyes).   over 30 days  . polyvinyl alcohol (LIQUIFILM TEARS) 1.4 % ophthalmic solution Place 1 drop into both eyes 4 (four) times daily as needed for dry eyes.   over 30 days  . promethazine (PHENERGAN) 25 MG suppository Place 25 mg rectally every 6 (six) hours as needed for nausea or vomiting.   02/14/2015 at Unknown time  . ranitidine (ZANTAC) 150 MG tablet Take 150 mg by mouth 2 (two) times daily.   not yet started  . spironolactone (ALDACTONE) 25 MG tablet Take 25 mg by mouth daily.   02/14/2015 at Unknown time  . torsemide (DEMADEX) 20 MG tablet Take one tablet daily to help regulate and prevent edema (Patient taking differently: Take 20 mg by mouth daily. Take one tablet daily to help regulate and prevent edema) 30 tablet 5 02/14/2015 at Unknown time  . losartan (COZAAR) 25 MG tablet Take 25 mg by mouth daily.   02/14/2015    Results for orders placed or performed during the hospital encounter of 02/14/15 (from the past 48 hour(s))  Basic  metabolic panel     Status: Abnormal   Collection Time: 02/20/15  5:20 AM  Result Value Ref Range   Sodium 131 (L) 135 - 145 mmol/L   Potassium 4.4 3.5 - 5.1 mmol/L   Chloride 99 (L) 101 - 111 mmol/L   CO2 20 (L) 22 - 32 mmol/L   Glucose, Bld 101 (H) 65 - 99 mg/dL   BUN 112 (H) 6 - 20 mg/dL   Creatinine, Ser 4.10 (H) 0.44 - 1.00 mg/dL   Calcium 8.7 (L) 8.9 - 10.3 mg/dL   GFR calc non Af Amer 8 (L) >60 mL/min   GFR calc Af Amer 10 (L) >60 mL/min    Comment: (NOTE) The eGFR has been calculated using the CKD EPI equation. This calculation has not been validated in all clinical situations. eGFR's persistently <60 mL/min signify possible Chronic Kidney Disease.    Anion gap 12 5 - 15  Brain natriuretic peptide     Status: Abnormal   Collection Time: 02/20/15  5:20 AM  Result Value Ref Range   B Natriuretic Peptide 827.9 (H) 0.0 - 100.0 pg/mL    Dg Chest 2 View  02/20/2015  CLINICAL DATA:  Shortness of breath.  CHF. EXAM: CHEST  2 VIEW COMPARISON:  02/14/2015. FINDINGS: Low volume film. Diffuse interstitial opacity is associated with alveolar opacity at the lung bases. There is retrocardiac collapse/consolidation with left pleural effusion. The cardio pericardial silhouette is enlarged. Bones are diffusely demineralized. Chronic rotator cuff disease noted in both shoulders. Telemetry leads overlie the chest. IMPRESSION: Cardiomegaly with features of pulmonary edema. Retrocardiac left base collapse/ consolidation with pleural effusion. Right basilar atelectasis. Electronically Signed   By: Misty Stanley M.D.   On: 02/20/2015 17:32   Dg Esophagus  02/19/2015  CLINICAL DATA:  Dysphagia. EXAM: ESOPHOGRAM/BARIUM SWALLOW TECHNIQUE: Single contrast examination was performed using  thin barium. FLUOROSCOPY TIME:  Radiation Exposure Index (as provided by the fluoroscopic device): 174 microGy*m^2 If the device does not provide the exposure index: Fluoroscopy Time:  dictate in minutes and seconds Number  of Acquired Images: COMPARISON:  Report from 04/06/2006 FINDINGS: The patient was able to perform a few individual swallows of barium well laying in the  LPO position. The patient was acutely short of breath during today's exam. Her family member attributed this to anxiety. The patient was on a 2 L per minute nasal cannula. The patient was unable to readily assume various positions and accordingly the entire exam was performed in the LPO position. Upon individual swallows of barium, there was disruption of primary peristaltic waves in the mid thoracic esophagus on all swallows. The patient experienced coughing and choking while swallowing. Small type 1 hiatal hernia. contrast slowly extended through the distal esophagus and hiatal hernia into the intra-abdominal portion of the stomach. The distal esophagus appears smooth but I was only able to distended up to about 19 mm (image 34, series 3). In the hiatal hernia portion, I was only able to distend the hernia up to about 9 mm. This is because of the esophageal dysmotility. Strictly speaking there could be some narrowing in the distal esophagus which could go on diagnosed due to the lack of ability to fully distend the esophagus distally. The patient swallowed a 13 mm barium tablet in applesauce. Due to the dysmotility, this did not extend beyond the mid to distal thoracic esophagus during several minutes of observation. The tablet is expected to dissolve over time. IMPRESSION: 1. Exam consisted of multiple swallows performed in the LPO position due to limited patient mobility and shortness of breath despite the use of the nasal cannula for oxygen. 2. Nonspecific esophageal dysmotility disorder, with disruption of all swallows in the mid thoracic esophagus. 3. Small type 1 hiatal hernia. 4. Maximum distention of the distal esophagus was about 19 mm and maximum distention in the vicinity of the hiatal hernia was about 9 mm. Thin barium was noted to slowly percolated  through this region but because of the dysmotility, and the patient's difficulty with swallowing, the region cannot be fully distended. Accordingly, narrowing in this area cannot be totally excluded. 5. 13 mm barium tablet was static in the mid to distal esophagus over several minutes of observation, attributed to dysmotility. 6. The patient did experience several episodes of coughing while trying to drink from the straw. She was tilted up about 25-30 degrees during swallowing. Electronically Signed   By: Van Clines M.D.   On: 02/19/2015 09:57    ROS Blood pressure 110/73, pulse 108, temperature 97.4 F (36.3 C), temperature source Oral, resp. rate 18, weight 78.291 kg (172 lb 9.6 oz), SpO2 98 %. Physical Exam Physical Examination: General appearance - pale,diffuse historian, elderly ,debill Mental status - alert, oriented to person, place, and time, poor train of thought, and wandering Eyes - pupils equal and reactive, extraocular eye movements intact, funduscopic exam normal, discs flat and sharp Mouth - whitish plaques in mouth Neck - PCL Lymphatics - posterior cervical nodes Chest - kyphosis, decreased bs, rales in bases Heart - irregularly irregular rhythm with rate 78, systolic murmur UE2/8 at apex Abdomen - pos bs, liver down 5 cm soft Musculoskeletal - hypertrophic  Changes in hand and knees Extremities - edema 2-3+ to presacral Skin - thin, pale, bruises,   Assessment/Plan: 1 CKD 4 most likely nephrosclerosis vs CIN.  Severe.  2 AKI  Most likely secondary to low bps inability to autoregulate, made worse by Losartan.  Suspect ATN.  Cannot r/o AIN but suspect ATN.  Need to let bp rise, avoid lowering , control rate with Amio alone.  Mild acidemia, mod vol xs but not prohibitive.  Will tx conservatively and see if allowing bp to rise, placing foley, tx  low bicarb, and time and see if recovers. Would not diurese.  With age, other comorbids, would not favor aggressive interventions  ie central lines, inotropes, pressors etc which may help.  Discussed with patient and daughter 3 Hypotension lessen meds 4. Anemia  5. Metabolic Bone Disease: will not eval 6 debillitation 7 Swallowing dysfunction add renal restrictions to diet P stop bp lowering meds, foley, bicarb, avoid diuresis, counsel, diet changes.  Urine chem, eos  Hoke Baer L 02/20/2015, 7:06 PM

## 2015-02-20 NOTE — Progress Notes (Signed)
Subjective:  Pt says she is "miserable". SOB with any exertion.  Objective:  Vital Signs in the last 24 hours: Temp:  [97.4 F (36.3 C)] 97.4 F (36.3 C) (02/14 1938) Pulse Rate:  [108-113] 108 (02/15 1155) Resp:  [18] 18 (02/14 1938) BP: (90-110)/(54-73) 110/73 mmHg (02/15 1155) SpO2:  [98 %] 98 % (02/14 1938) Weight:  [172 lb 9.6 oz (78.291 kg)] 172 lb 9.6 oz (78.291 kg) (02/15 0802)  Intake/Output from previous day:  Intake/Output Summary (Last 24 hours) at 02/20/15 1329 Last data filed at 02/20/15 1028  Gross per 24 hour  Intake    120 ml  Output      2 ml  Net    118 ml    Physical Exam: General appearance: alert, cooperative and no distress Lungs: basilar crackles Heart: irregularly irregular rhythm Skin: pale, cool, dry Neurologic: Grossly normal   Rate: 108  Rhythm: atrial fibrillation  Lab Results:  Recent Labs  02/18/15 0412  WBC 7.1  HGB 11.2*  PLT 240    Recent Labs  02/18/15 0412 02/20/15 0520  NA 132* 131*  K 3.6 4.4  CL 102 99*  CO2 19* 20*  GLUCOSE 98 101*  BUN 88* 112*  CREATININE 2.87* 4.10*   No results for input(s): TROPONINI in the last 72 hours.  Invalid input(s): CK, MB No results for input(s): INR in the last 72 hours.  Scheduled Meds: . acetaminophen  500 mg Oral TID  . amiodarone  400 mg Oral Daily  . calcium-vitamin D  1 tablet Oral Q breakfast  . cholecalciferol  1,000 Units Oral Daily  . diltiazem  240 mg Oral Daily  . docusate sodium  100 mg Oral Q12H  . famotidine  20 mg Oral Daily  . feeding supplement (ENSURE ENLIVE)  237 mL Oral BID BM  . levothyroxine  88 mcg Oral QAC breakfast  . loratadine  10 mg Oral Daily  . metoprolol tartrate  12.5 mg Oral BID  . multivitamin with minerals  1 tablet Oral Daily  . sodium chloride flush  3 mL Intravenous Q12H   Continuous Infusions:  PRN Meds:.sodium chloride, ALPRAZolam, antiseptic oral rinse, fluticasone, magic mouthwash w/lidocaine, menthol-cetylpyridinium,  nitroGLYCERIN, ondansetron (ZOFRAN) IV, polyvinyl alcohol, sodium chloride flush, zolpidem   Imaging: Imaging results have been reviewed  Cardiac Studies:  Assessment/Plan:  82 y.o. female with a history of SVT, MR, D-CHF, CKD, HTN, admitted 02/14/15 with AF with RVR and CHF. She has developed acute on chronic renal insufficiency despite being off diuretics. She rescinded her DNR this admission and is now a partial code.   Principal Problem:   Atrial fibrillation with RVR (HCC) Active Problems:   Acute on chronic combined systolic and diastolic congestive heart failure, NYHA class 4 (HCC)   Chronic kidney disease, stage III (moderate)   Severe mitral regurgitation   Chronic anticoagulation   Acute on chronic renal insufficiency (HCC)   Hypothyroidism-Synthroid increased   Esophageal dysmotility   Dysphagia   Cardiomyopathy, presumed ischemic-EF 40-45%   Essential hypertension   PLAN:   DC Eliquis, she will need Coumadin. She has CHF but her I/O is + 2.9L and her wgt is up 4 lbs. Consider a trial of aggressive diuresis to see if her CHF and SCr improves.  If her SCr rises consider renal consult though I doubt she is a candidate for HD at 89. Palliative Care may be most appropriate.   [employer] PA-C 02/20/2015, 1:29 PM [phone]  I  have seen and examined the patient along with Kerin Ransom PA-C.  I have reviewed the chart, notes and new data.  I agree with PA's note.  Key new complaints: feels terrible, but not necessarily worse than yesterday Key examination changes: rales in lung bases (atelectatic? CHF?) Key new findings / data: creat now >4  PLAN: Losartan stopped on 2/9, no diuretics since 2/10, but renal function rapidly worsening. No contrast based procedures during this admission. May have had low CO during AFib with RVR. She was mildly hypotensive 0n 2/9-02/11 but was never profoundly hypotensive. May be ischemic ATN. Have asked Dr. Florene Glen to evaluate her. Check BNP  and CXR. Prognosis is poor due to age and comorbid conditions. Have to stop Eliquis.  Sanda Klein, MD, [place] [phone] 02/20/2015, 4:27 PM

## 2015-02-20 NOTE — Evaluation (Signed)
Physical Therapy Evaluation Patient Details Name: Ann [name] MRN: [id number] DOB: 1918-04-03 Today's Date: 02/20/2015   History of Present Illness  82 y.o. female with a history of reflux, esophagitis, cervicalgia, SVT, MR, D-CHF, esophageal dysmotility on barium esophagram 2008, CKD, HTN, allergic to Lasix, who lives in [place]. She presents with difficulty swallowing and weakness from not eating for several days.  Clinical Impression  Patient demonstrates deficits in functional mobility as indicated below. Will need continued skilled PT to address deficits and maximize function. Will see as indicated and progress as tolerated.  OF NOTE: patient very anxious with any activity, reluctant to mobilize.  Spoke with daughter and patient at length regarding safety with discharge. Patient understands that she is NOT safe for d/c home with current deficits. Will benefit from [facility] upon acute discharge.     Follow Up Recommendations SNF;Supervision/Assistance - 24 hour (vs increased assist (24 hr))    Equipment Recommendations  None recommended by PT    Recommendations for Other Services       Precautions / Restrictions Precautions Precautions: Fall Restrictions Weight Bearing Restrictions: No      Mobility  Bed Mobility               General bed mobility comments: received in chair  Transfers Overall transfer level: Needs assistance Equipment used: Rolling walker (2 wheeled) Transfers: Sit to/from Stand Sit to Stand: Min assist         General transfer comment: increased time to perform, cues for positioning at chair, assist to power up from chair and provide stability while using RW. Patient very reluctant to mobilize  Ambulation/Gait Ambulation/Gait assistance: Min assist Ambulation Distance (Feet): 16 Feet Assistive device: Rolling walker (2 wheeled) Gait Pattern/deviations: Step-to pattern;Decreased stride length;Shuffle;Trunk flexed;Narrow base of  support;Drifts right/left;Decreased weight shift to left Gait velocity: decreased Gait velocity interpretation: <1.8 ft/sec, indicative of risk for recurrent falls General Gait Details: patient very guarded and cautious. minimally functional steps with poor ability to clean feet from floor. Increased fatigue with very limited activity  Stairs            Wheelchair Mobility    Modified Rankin (Stroke Patients Only)       Balance Overall balance assessment: Needs assistance   Sitting balance-Leahy Scale: Fair Sitting balance - Comments: fwd flexed posture   Standing balance support: During functional activity;Bilateral upper extremity supported Standing balance-Leahy Scale: Poor Standing balance comment: heavy reliance on RW                             Pertinent Vitals/Pain Pain Assessment: No/denies pain    Home Living Family/patient expects to be discharged to:: Assisted living               Home Equipment: Walker - 2 wheels;Walker - 4 wheels;Cane - single point;Electric scooter Additional Comments: uses scooter to go to [employer], uses RW to go to bathroom in apt    Prior Function Level of Independence: Independent with assistive device(s)         Comments: reports that she doesnt typically have help with bathing, uses RW in room and scooter out of room     Hand Dominance   Dominant Hand: Right    Extremity/Trunk Assessment   Upper Extremity Assessment: Generalized weakness           Lower Extremity Assessment: Generalized weakness (LLE weakness >than right 3-/5 vs 3/5)  Cervical / Trunk Assessment: Kyphotic  Communication   Communication: No difficulties  Cognition Arousal/Alertness: Awake/alert Behavior During Therapy: Anxious Overall Cognitive Status: Within Functional Limits for tasks assessed                      General Comments General comments (skin integrity, edema, etc.): patient very anxious with  very limited activity    Exercises        Assessment/Plan    PT Assessment Patient needs continued PT services  PT Diagnosis Difficulty walking;Abnormality of gait;Generalized weakness   PT Problem List Decreased strength;Decreased activity tolerance;Decreased balance;Decreased mobility;Decreased coordination;Decreased safety awareness;Cardiopulmonary status limiting activity  PT Treatment Interventions DME instruction;Functional mobility training;Gait training;Therapeutic activities;Balance training;Therapeutic exercise;Patient/family education   PT Goals (Current goals can be found in the Care Plan section) Acute Rehab PT Goals Patient Stated Goal: to get better PT Goal Formulation: With patient/family Time For Goal Achievement: 03/06/15 Potential to Achieve Goals: Good    Frequency Min 2X/week   Barriers to discharge Decreased caregiver support is in assisted living but states that she does not have much assist    Co-evaluation               End of Session Equipment Utilized During Treatment: Gait belt;Oxygen Activity Tolerance: Patient limited by fatigue Patient left: in chair;with call bell/phone within reach;with family/visitor present Nurse Communication: Mobility status         Time: [id number] PT Time Calculation (min) (ACUTE ONLY): 21 min   Charges:   PT Evaluation $PT Eval Moderate Complexity: 1 Procedure     PT G CodesDuncan Dull March 12, 2015, 12:09 PM  Alben Deeds, [place] DPT  [phone]

## 2015-02-21 LAB — RENAL FUNCTION PANEL
Albumin: 2.7 g/dL — ABNORMAL LOW (ref 3.5–5.0)
Anion gap: 17 — ABNORMAL HIGH (ref 5–15)
BUN: 123 mg/dL — AB (ref 6–20)
CALCIUM: 9.1 mg/dL (ref 8.9–10.3)
CO2: 18 mmol/L — AB (ref 22–32)
CREATININE: 4.5 mg/dL — AB (ref 0.44–1.00)
Chloride: 97 mmol/L — ABNORMAL LOW (ref 101–111)
GFR calc non Af Amer: 8 mL/min — ABNORMAL LOW (ref >60)
GFR, EST AFRICAN AMERICAN: 9 mL/min — AB (ref >60)
GLUCOSE: 90 mg/dL (ref 65–99)
Phosphorus: 5.9 mg/dL — ABNORMAL HIGH (ref 2.5–4.6)
Potassium: 4.6 mmol/L (ref 3.5–5.1)
SODIUM: 132 mmol/L — AB (ref 135–145)

## 2015-02-21 LAB — SODIUM, URINE, RANDOM

## 2015-02-21 LAB — CREATININE, URINE, RANDOM: Creatinine, Urine: 134.43 mg/dL

## 2015-02-21 MED ORDER — LORAZEPAM 2 MG/ML IJ SOLN
INTRAMUSCULAR | Status: AC
  Filled 2015-02-21: qty 1

## 2015-02-21 MED ORDER — FUROSEMIDE 10 MG/ML IJ SOLN
120.0000 mg | Freq: Two times a day (BID) | INTRAVENOUS | Status: DC
  Administered 2015-02-21 – 2015-02-22 (×3): 120 mg via INTRAVENOUS
  Filled 2015-02-21 (×4): qty 12

## 2015-02-21 MED ORDER — LORAZEPAM 2 MG/ML IJ SOLN
0.2500 mg | Freq: Once | INTRAMUSCULAR | Status: AC
  Administered 2015-02-21: 0.25 mg via INTRAVENOUS

## 2015-02-21 NOTE — Progress Notes (Signed)
Assessment/Plan: 1 CKD 4 most likely nephrosclerosis vs CIN.  2 AKI Hemodynamically mediated-oliguric 3 Hypotension lessen meds  4 Swallowing dysfunction  5 CHF/ AFIB  P No good options but need to try to optimize CO. Will dose furosemide due to oliguria  Subjective: Interval History: coughing sputum  Objective: Vital signs in last 24 hours: Temp:  [97 F (36.1 C)-97.2 F (36.2 C)] 97 F (36.1 C) (02/16 0416) Pulse Rate:  [88-115] 88 (02/16 0416) Resp:  [18] 18 (02/16 0416) BP: (103-110)/(66-79) 104/79 mmHg (02/16 0416) SpO2:  [98 %] 98 % (02/16 0416) Weight:  [75.8 kg (167 lb 1.7 oz)] 75.8 kg (167 lb 1.7 oz) (02/16 0416) Weight change: 12.519 kg (27 lb 9.6 oz)  Intake/Output from previous day: 02/15 0701 - 02/16 0700 In: 60 [P.O.:60] Out: 101 [Urine:100; Stool:1] Intake/Output this shift:    General appearance: alert and weak appearing Resp: diminished breath sounds bilaterally Cardio: irregularly irregular rhythm GI: soft Extremities: edema 1-2+  Lab Results: No results for input(s): WBC, HGB, HCT, PLT in the last 72 hours. BMET:  Recent Labs  02/20/15 0520 02/21/15 0420  NA 131* 132*  K 4.4 4.6  CL 99* 97*  CO2 20* 18*  GLUCOSE 101* 90  BUN 112* 123*  CREATININE 4.10* 4.50*  CALCIUM 8.7* 9.1   No results for input(s): PTH in the last 72 hours. Iron Studies: No results for input(s): IRON, TIBC, TRANSFERRIN, FERRITIN in the last 72 hours. Studies/Results: Dg Chest 2 View  02/20/2015  CLINICAL DATA:  Shortness of breath.  CHF. EXAM: CHEST  2 VIEW COMPARISON:  02/14/2015. FINDINGS: Low volume film. Diffuse interstitial opacity is associated with alveolar opacity at the lung bases. There is retrocardiac collapse/consolidation with left pleural effusion. The cardio pericardial silhouette is enlarged. Bones are diffusely demineralized. Chronic rotator cuff disease noted in both shoulders. Telemetry leads overlie the chest. IMPRESSION: Cardiomegaly with  features of pulmonary edema. Retrocardiac left base collapse/ consolidation with pleural effusion. Right basilar atelectasis. Electronically Signed   By: Misty Stanley M.D.   On: 02/20/2015 17:32    Scheduled: . acetaminophen  500 mg Oral TID  . amiodarone  400 mg Oral Daily  . clotrimazole  10 mg Oral 5 X Daily  . docusate sodium  100 mg Oral Q12H  . famotidine  20 mg Oral Daily  . feeding supplement (ENSURE ENLIVE)  237 mL Oral BID BM  . levothyroxine  88 mcg Oral QAC breakfast  . loratadine  10 mg Oral Daily  . multivitamin with minerals  1 tablet Oral Daily  . sodium bicarbonate  1,300 mg Oral BID  . sodium chloride flush  3 mL Intravenous Q12H     LOS: 7 days   Isela Stantz C 02/21/2015,10:59 AM   .

## 2015-02-21 NOTE — Progress Notes (Signed)
Subjective:  Pt looks a little SOB, on O2, sitting up in bed  Objective:  Vital Signs in the last 24 hours: Temp:  [97 F (36.1 C)-97.2 F (36.2 C)] 97 F (36.1 C) (02/16 0416) Pulse Rate:  [88-115] 88 (02/16 0416) Resp:  [18] 18 (02/16 0416) BP: (103-110)/(66-79) 104/79 mmHg (02/16 0416) SpO2:  [98 %] 98 % (02/16 0416) Weight:  [167 lb 1.7 oz (75.8 kg)] 167 lb 1.7 oz (75.8 kg) (02/16 0416)  Intake/Output from previous day:  Intake/Output Summary (Last 24 hours) at 02/21/15 1001 Last data filed at 02/21/15 0600  Gross per 24 hour  Intake     60 ml  Output    101 ml  Net    -41 ml    Physical Exam: General appearance: alert, cooperative and no distress Lungs: basilar crackles Heart: irregularly irregular rhythm Skin: pale, cool, dry Neurologic: Grossly normal   Rate: 88  Rhythm: atrial fibrillation  Lab Results: No results for input(s): WBC, HGB, PLT in the last 72 hours.  Recent Labs  02/20/15 0520 02/21/15 0420  NA 131* 132*  K 4.4 4.6  CL 99* 97*  CO2 20* 18*  GLUCOSE 101* 90  BUN 112* 123*  CREATININE 4.10* 4.50*   No results for input(s): TROPONINI in the last 72 hours.  Invalid input(s): CK, MB No results for input(s): INR in the last 72 hours.  Scheduled Meds: . acetaminophen  500 mg Oral TID  . amiodarone  400 mg Oral Daily  . clotrimazole  10 mg Oral 5 X Daily  . docusate sodium  100 mg Oral Q12H  . famotidine  20 mg Oral Daily  . feeding supplement (ENSURE ENLIVE)  237 mL Oral BID BM  . levothyroxine  88 mcg Oral QAC breakfast  . loratadine  10 mg Oral Daily  . multivitamin with minerals  1 tablet Oral Daily  . sodium bicarbonate  1,300 mg Oral BID  . sodium chloride flush  3 mL Intravenous Q12H   Continuous Infusions:  PRN Meds:.sodium chloride, ALPRAZolam, antiseptic oral rinse, fluticasone, magic mouthwash w/lidocaine, menthol-cetylpyridinium, nitroGLYCERIN, ondansetron (ZOFRAN) IV, polyvinyl alcohol, sodium chloride flush,  zolpidem   Imaging: Imaging results have been reviewed  Cardiac Studies:  Assessment/Plan:  82 y.o. female with a history of SVT, MR, D-CHF, CKD, HTN, admitted 02/14/15 with AF with RVR and CHF. She has developed acute on chronic renal insufficiency despite being off diuretics. She rescinded her DNR this admission and is now a partial code.   Principal Problem:   Atrial fibrillation with RVR (HCC) Active Problems:   Acute on chronic combined systolic and diastolic congestive heart failure, NYHA class 4 (HCC)   Acute on chronic renal insufficiency (HCC)   Chronic kidney disease, stage III (moderate)   Severe mitral regurgitation   Chronic anticoagulation   Hypothyroidism-Synthroid increased   Esophageal dysmotility   Dysphagia   Cardiomyopathy, presumed ischemic-EF 40-45%   Essential hypertension   PLAN:   Eliquis stopped but she did receive a dose yesterday, Coumadin has not been ordered. Medications adjusted as per Dr Deterding's recommendations. SCr up slight today- 4.5. MD to see.  Amiodarone held by RN- will discuss with staff- do not hold Amiodarone.   Kerin Ransom PA-C 02/21/2015, 10:01 AM [phone]  I have seen and examined the patient along with Kerin Ransom PA-C.  I have reviewed the chart, notes and new data.  I agree with PA/NP's note.  Key new complaints: same level of dyspnea Key  examination changes: elevated JVD and some edema, bibasilar rales. Ventricular rate 100-105. Key new findings / data: creat increased to 4.5, but appear to be worsening at a slower pace.  PLAN: Will continue to hold diltiazem and metoprolol to allow higher BP. Continue amiodarone. If renal function continues to deteriorate, may need to discuss palliative care.  Sanda Klein, MD, [place] [phone] 02/21/2015, 12:23 PM

## 2015-02-21 NOTE — Care Management Important Message (Signed)
Important Message  Patient Details  Name: Ann [name] MRN: [id number] Date of Birth: [birth year]   Medicare Important Message Given:  Yes    Ann [name] P Kilian Schwartz 02/21/2015, 12:05 PM

## 2015-02-22 LAB — BASIC METABOLIC PANEL
Anion gap: 15 (ref 5–15)
BUN: 137 mg/dL — AB (ref 6–20)
CALCIUM: 8.5 mg/dL — AB (ref 8.9–10.3)
CO2: 20 mmol/L — ABNORMAL LOW (ref 22–32)
Chloride: 97 mmol/L — ABNORMAL LOW (ref 101–111)
Creatinine, Ser: 4.79 mg/dL — ABNORMAL HIGH (ref 0.44–1.00)
GFR calc Af Amer: 8 mL/min — ABNORMAL LOW (ref >60)
GFR, EST NON AFRICAN AMERICAN: 7 mL/min — AB (ref >60)
GLUCOSE: 102 mg/dL — AB (ref 65–99)
POTASSIUM: 4.3 mmol/L (ref 3.5–5.1)
SODIUM: 132 mmol/L — AB (ref 135–145)

## 2015-02-22 MED ORDER — LEVOTHYROXINE SODIUM 88 MCG PO TABS: 88.0000 ug | ORAL_TABLET | Freq: Every day | ORAL | Status: AC

## 2015-02-22 MED ORDER — ALPRAZOLAM 0.25 MG PO TABS
0.2500 mg | ORAL_TABLET | Freq: Two times a day (BID) | ORAL | Status: AC | PRN
Start: 2015-02-22 — End: ?

## 2015-02-22 MED ORDER — AMIODARONE HCL 400 MG PO TABS: 400.0000 mg | ORAL_TABLET | Freq: Every day | ORAL | Status: AC

## 2015-02-22 NOTE — Progress Notes (Signed)
   02/22/15 1547  PT Visit Information  Reason Eval/Treat Not Completed Fatigue/lethargy limiting ability to participate (pt and daughter report ambulating with nursing and sitting up in chair x 2 hours.  Pt reports fatigue and refused tx this pm.  )

## 2015-02-22 NOTE — Progress Notes (Signed)
Subjective:  Pt looks a little SOB, on O2, sitting up in bed  Objective:  Vital Signs in the last 24 hours: Temp:  [97.6 F (36.4 C)-97.8 F (36.6 C)] 97.8 F (36.6 C) (02/17 0517) Pulse Rate:  [72-119] 109 (02/17 1228) Resp:  [19-20] 20 (02/17 1228) BP: (88-100)/(35-56) 92/42 mmHg (02/17 1228) SpO2:  [97 %-100 %] 98 % (02/17 1228) Weight:  [74.98 kg (165 lb 4.8 oz)] 74.98 kg (165 lb 4.8 oz) (02/17 0517)  Intake/Output from previous day:  Intake/Output Summary (Last 24 hours) at 02/22/15 1407 Last data filed at 02/22/15 1338  Gross per 24 hour  Intake    584 ml  Output    326 ml  Net    258 ml    Physical Exam: General appearance: alert, cooperative and no distress Lungs: basilar crackles Heart: irregularly irregular rhythm Skin: pale, cool, dry Neurologic: Grossly normal   Rate: 88  Rhythm: atrial fibrillation  Lab Results: No results for input(s): WBC, HGB, PLT in the last 72 hours.  Recent Labs  02/21/15 0420 02/22/15 1050  NA 132* 132*  K 4.6 4.3  CL 97* 97*  CO2 18* 20*  GLUCOSE 90 102*  BUN 123* 137*  CREATININE 4.50* 4.79*   No results for input(s): TROPONINI in the last 72 hours.  Invalid input(s): CK, MB No results for input(s): INR in the last 72 hours.  Scheduled Meds: . acetaminophen  500 mg Oral TID  . amiodarone  400 mg Oral Daily  . clotrimazole  10 mg Oral 5 X Daily  . docusate sodium  100 mg Oral Q12H  . famotidine  20 mg Oral Daily  . feeding supplement (ENSURE ENLIVE)  237 mL Oral BID BM  . furosemide  120 mg Intravenous Q12H  . levothyroxine  88 mcg Oral QAC breakfast  . loratadine  10 mg Oral Daily  . multivitamin with minerals  1 tablet Oral Daily  . sodium bicarbonate  1,300 mg Oral BID  . sodium chloride flush  3 mL Intravenous Q12H   Continuous Infusions:  PRN Meds:.sodium chloride, ALPRAZolam, antiseptic oral rinse, fluticasone, magic mouthwash w/lidocaine, menthol-cetylpyridinium, nitroGLYCERIN, ondansetron  (ZOFRAN) IV, polyvinyl alcohol, sodium chloride flush, zolpidem   Imaging: Imaging results have been reviewed  Cardiac Studies:  Assessment/Plan:  82 y.o. female with a history of SVT, MR, D-CHF, CKD, HTN, admitted 02/14/15 with AF with RVR and CHF. She has developed acute on chronic renal insufficiency despite being off diuretics. She rescinded her DNR this admission and is now a partial code.   Principal Problem:   Atrial fibrillation with RVR (HCC) Active Problems:   Acute on chronic combined systolic and diastolic congestive heart failure, NYHA class 4 (HCC)   Acute on chronic renal insufficiency (HCC)   Chronic kidney disease, stage III (moderate)   Severe mitral regurgitation   Chronic anticoagulation   Hypothyroidism-Synthroid increased   Esophageal dysmotility   Dysphagia   Cardiomyopathy, presumed ischemic-EF 40-45%   Essential hypertension   PLAN:   Holding diuretics recommend 02/20/15 but Lasix 120 mg given IV yesterday x 1. SCr up today. Coumadin has not yet been resumed. The pts daughter has asked that Palliative Care be called and I will do this.  Kerin Ransom PA-C 02/22/2015, 2:07 PM [phone]  I have seen and examined the patient along with Kerin Ransom PA-C.  I have reviewed the chart, notes and new data.  I agree with PA's note.  PLAN: Ventricular rate control marginal (  100s at rest). Creatinine level seems to be reaching a plateau, but she is oliguric. Family and patient inclined towards palliative care and they have found that there is a semi-private bed available at [facility]. Pallaitive/hospice care is appropriate. At this point, there are no plans for aggressive interventions and it is unlikely we will be changing medications unless/until there is substantial renal recovery. Focus on symptom relief. Palliative care consult has been called.  Sanda Klein, MD, [place] [phone] 02/22/2015, 3:12 PM

## 2015-02-22 NOTE — Progress Notes (Signed)
Speech Language Pathology Treatment:    Patient Details Name: Ann [name] MRN: [id number] DOB: 27-Jul-1918 Today's Date: 02/22/2015 Time: [id number] SLP Time Calculation (min) (ACUTE ONLY): 22 min  Assessment / Plan / Recommendation Clinical Impression  Session consisted of education and discussion re: diet texture upgrade to Dys 3 2/15. Pt and daughter report oropharyngeal transit of Dys 3 textures has improved from initial days of admission and is able to tolerate. No difficulty during consumption of liquids. Reviewed esophageal strategies. Continue Dys 3, thin. ST to sign off at this time.     HPI HPI: Ann [name] is a 82 y.o. female with a history of reflux, esophagitis, cervicalgia, SVT, MR, D-CHF, esophageal dysmotility on barium esophagram 2008, CKD, HTN, allergic to Lasix, who lives in Assisted Living. CXR Stable mild enlargement of the cardiopericardial silhouette.There is upper zone pulmonary vascular prominence which is nonspecific given the semi erect positioning, but which could reflect pulmonary venous hypertension. No overt edema. Admitted with relatively sudden onset of dysphagia with  vomiting and states she cannot swallow solid foods and swallowing liquids better.      SLP Plan  Continue with current plan of care     Recommendations  Liquids provided via: Cup;No straw Medication Administration: Whole meds with puree Supervision: Patient able to self feed;Intermittent supervision to cue for compensatory strategies Compensations: Slow rate;Small sips/bites;Follow solids with liquid Postural Changes and/or Swallow Maneuvers: Seated upright 90 degrees;Upright 30-60 min after meal             Oral Care Recommendations: Oral care BID Follow up Recommendations: None Plan: Continue with current plan of care     GO                [name] 02/22/2015, 3:14 PM   Orbie Pyo Colvin Caroli.[employer] [phone]

## 2015-02-22 NOTE — Progress Notes (Signed)
Attempted to remove foley catheter as ordered. Patient and family refused for foley catheter to be removed stating that "The Kidney Doctor ordered a special catheter to put some ease on kidneys." Patient does not want to have to have another foley catheter placed at [facility]. I will call report to [facility] and report this as well.

## 2015-02-22 NOTE — Discharge Summary (Signed)
Discharge Summary    Patient ID: Ann [name],  MRN: [id number], DOB/AGE: 07-27-1918 82 y.o.  Admit date: 02/14/2015 Discharge date: 02/22/2015  Primary Care Provider: Estill Dooms Primary Cardiologist: Dr Mare Ferrari  Discharge Diagnoses    Principal Problem:   Atrial fibrillation with RVR ([place]) Active Problems:   Acute on chronic combined systolic and diastolic congestive heart failure, NYHA class 4 (HCC)   Acute on chronic renal insufficiency (HCC)   Chronic kidney disease, stage III (moderate)   Severe mitral regurgitation   Chronic anticoagulation   Hypothyroidism-Synthroid increased   Esophageal dysmotility   Dysphagia   Cardiomyopathy, presumed ischemic-EF 40-45%   Essential hypertension   Allergies Allergies  Allergen Reactions  . Protonix [Pantoprazole Sodium] Rash  . Ace Inhibitors Cough  . Amoxicillin Diarrhea  . Biaxin [Clarithromycin]     Noted on info sheet from independent living  . Codeine   . Darvon   . Ibuprofen Other (See Comments)    Noted on info sheet from independent living  . Mercurochrome [Merbromin (Mercurochrome)]   . Pantoprazole     Dizzy  . Prilosec [Omeprazole] Other (See Comments)    Noted on info sheet from independent living  . Septra [Sulfamethoxazole-Trimethoprim]   . Sulfa Drugs Cross Reactors   . Tramadol     Abnormal kidney function  . Vesicare [Solifenacin]     Drys out nose and throat     Diagnostic Studies/Procedures    _____________   History of Present Illness    82 y/o female admitted with acute on chronic CHF   Hospital Course     Consultants: Nephrology 02/20/15   82 y.o. female with a history of SVT, MR, D-CHF, CKD, HTN, admitted 02/14/15 with AF with RVR and CHF. She has developed acute on chronic renal insufficiency despite being off diuretics. She was admitted and Amiodarone was added for rate control. Her B/P remained low, limiting medical Rx. She never had significant diuresis and her SCr continued  to climb. She was seen by the nephrologist 02/20/15 and her medications were held to try and achieve an elevated B/P and increased renal perfusion. Despite this her SCr continued to climb. She was given one dose of 120 mg IV Lasix 02/21/15 with no effect. The pt and her daughter are now inclined to pursue palliative care and have found a bed at [facility]. Dr Sallyanne Kuster aggress with the pt and family that no further aggressive interventions or medication adjustments are appropriate unless she has a significant recovery of her renal function.  _____________  Discharge Vitals Blood pressure 92/42, pulse 109, temperature 97.8 F (36.6 C), temperature source Oral, resp. rate 20, weight 74.98 kg (165 lb 4.8 oz), SpO2 98 %.  Filed Weights   02/20/15 0802 02/21/15 0416 02/22/15 0517  Weight: 78.291 kg (172 lb 9.6 oz) 75.8 kg (167 lb 1.7 oz) 74.98 kg (165 lb 4.8 oz)    Labs & Radiologic Studies     CBC  Recent Labs  02/20/15 2015  NEUTROABS 4.6   Basic Metabolic Panel  Recent Labs  02/21/15 0420 02/22/15 1050  NA 132* 132*  K 4.6 4.3  CL 97* 97*  CO2 18* 20*  GLUCOSE 90 102*  BUN 123* 137*  CREATININE 4.50* 4.79*  CALCIUM 9.1 8.5*  PHOS 5.9*  --    Liver Function Tests  Recent Labs  02/21/15 0420  ALBUMIN 2.7*   No results for input(s): LIPASE, AMYLASE in the last 72 hours. Cardiac  Enzymes No results for input(s): CKTOTAL, CKMB, CKMBINDEX, TROPONINI in the last 72 hours. BNP Invalid input(s): POCBNP D-Dimer No results for input(s): DDIMER in the last 72 hours. Hemoglobin A1C No results for input(s): HGBA1C in the last 72 hours. Fasting Lipid Panel No results for input(s): CHOL, HDL, LDLCALC, TRIG, CHOLHDL, LDLDIRECT in the last 72 hours. Thyroid Function Tests No results for input(s): TSH, T4TOTAL, T3FREE, THYROIDAB in the last 72 hours.  Invalid input(s): FREET3  Dg Chest 2 View  02/20/2015  CLINICAL DATA:  Shortness of breath.  CHF. EXAM: CHEST  2 VIEW COMPARISON:   02/14/2015. FINDINGS: Low volume film. Diffuse interstitial opacity is associated with alveolar opacity at the lung bases. There is retrocardiac collapse/consolidation with left pleural effusion. The cardio pericardial silhouette is enlarged. Bones are diffusely demineralized. Chronic rotator cuff disease noted in both shoulders. Telemetry leads overlie the chest. IMPRESSION: Cardiomegaly with features of pulmonary edema. Retrocardiac left base collapse/ consolidation with pleural effusion. Right basilar atelectasis. Electronically Signed   By: Misty Stanley M.D.   On: 02/20/2015 17:32   Dg Esophagus  02/19/2015  CLINICAL DATA:  Dysphagia. EXAM: ESOPHOGRAM/BARIUM SWALLOW TECHNIQUE: Single contrast examination was performed using  thin barium. FLUOROSCOPY TIME:  Radiation Exposure Index (as provided by the fluoroscopic device): 174 microGy*m^2 If the device does not provide the exposure index: Fluoroscopy Time:  dictate in minutes and seconds Number of Acquired Images: COMPARISON:  Report from 04/06/2006 FINDINGS: The patient was able to perform a few individual swallows of barium well laying in the LPO position. The patient was acutely short of breath during today's exam. Her family member attributed this to anxiety. The patient was on a 2 L per minute nasal cannula. The patient was unable to readily assume various positions and accordingly the entire exam was performed in the LPO position. Upon individual swallows of barium, there was disruption of primary peristaltic waves in the mid thoracic esophagus on all swallows. The patient experienced coughing and choking while swallowing. Small type 1 hiatal hernia. contrast slowly extended through the distal esophagus and hiatal hernia into the intra-abdominal portion of the stomach. The distal esophagus appears smooth but I was only able to distended up to about 19 mm (image 34, series 3). In the hiatal hernia portion, I was only able to distend the hernia up to  about 9 mm. This is because of the esophageal dysmotility. Strictly speaking there could be some narrowing in the distal esophagus which could go on diagnosed due to the lack of ability to fully distend the esophagus distally. The patient swallowed a 13 mm barium tablet in applesauce. Due to the dysmotility, this did not extend beyond the mid to distal thoracic esophagus during several minutes of observation. The tablet is expected to dissolve over time. IMPRESSION: 1. Exam consisted of multiple swallows performed in the LPO position due to limited patient mobility and shortness of breath despite the use of the nasal cannula for oxygen. 2. Nonspecific esophageal dysmotility disorder, with disruption of all swallows in the mid thoracic esophagus. 3. Small type 1 hiatal hernia. 4. Maximum distention of the distal esophagus was about 19 mm and maximum distention in the vicinity of the hiatal hernia was about 9 mm. Thin barium was noted to slowly percolated through this region but because of the dysmotility, and the patient's difficulty with swallowing, the region cannot be fully distended. Accordingly, narrowing in this area cannot be totally excluded. 5. 13 mm barium tablet was static in the mid to  distal esophagus over several minutes of observation, attributed to dysmotility. 6. The patient did experience several episodes of coughing while trying to drink from the straw. She was tilted up about 25-30 degrees during swallowing. Electronically Signed   By: Van Clines M.D.   On: 02/19/2015 09:57   US Renal  02/17/2015  CLINICAL DATA:  Acute renal failure EXAM: RENAL / URINARY TRACT ULTRASOUND COMPLETE COMPARISON:  None. FINDINGS: Right Kidney: Length: 8.6 cm. Echogenicity within normal limits. No mass or hydronephrosis visualized. Left Kidney: Length: 8.4 cm. Echogenicity within normal limits. No mass or hydronephrosis visualized. Bladder: Decompressed by Foley catheter. IMPRESSION: No acute abnormality  noted. Electronically Signed   By: Inez Catalina M.D.   On: 02/17/2015 18:38   Dg Chest Port 1 View  02/14/2015  CLINICAL DATA:  Tachycardia starting this morning. Recent bronchitis. EXAM: PORTABLE CHEST 1 VIEW COMPARISON:  09/27/2014 FINDINGS: Defibrillator patch is noted. Atherosclerotic calcification of the aortic arch. Stable mild enlargement of the cardiopericardial silhouette. Upper zone pulmonary vascular prominence is indeterminate due to the semi erect positioning. Bilateral chronic rotator cuff tears with delamination of the acromion humeral space. No significant blunting of the costophrenic angles. IMPRESSION: 1. Stable mild enlargement of the cardiopericardial silhouette. There is upper zone pulmonary vascular prominence which is nonspecific given the semi erect positioning, but which could reflect pulmonary venous hypertension. No overt edema. 2. Bilateral chronic rotator cuff tears with severe degenerative glenohumeral arthropathy bilaterally. 3. Atherosclerotic aortic arch. Electronically Signed   By: Van Clines M.D.   On: 02/14/2015 18:38    Disposition   Pt is being discharged home today in good condition.  Follow-up Plans & Appointments    Follow-up Information    Follow up with Warren Danes, MD.   Specialty:  Cardiology   Why:  As needed   Contact information:   [address] [place] [address] [phone]        Discharge Medications   Current Discharge Medication List    START taking these medications   Details  ALPRAZolam (XANAX) 0.25 MG tablet Take 1 tablet (0.25 mg total) by mouth 2 (two) times daily as needed for anxiety. Qty: 30 tablet, Refills: 0    amiodarone (PACERONE) 400 MG tablet Take 1 tablet (400 mg total) by mouth daily. Qty: 30 tablet, Refills: 5      CONTINUE these medications which have CHANGED   Details  levothyroxine (SYNTHROID, LEVOTHROID) 88 MCG tablet Take 1 tablet (88 mcg total) by mouth daily before  breakfast. Qty: 30 tablet, Refills: 5      CONTINUE these medications which have NOT CHANGED   Details  acetaminophen (TYLENOL) 500 MG tablet Take 1 tablet (500 mg total) by mouth every 4 (four) hours as needed for mild pain or moderate pain. Qty: 30 tablet, Refills: 0    Artificial Saliva (BIOTENE MOISTURIZING MOUTH MT) Use as directed in the mouth or throat. Use as directed    aspirin EC 81 MG tablet Take 81 mg by mouth daily.    Calcium & Magnesium Carbonates (MYLANTA PO) Take 30 mLs by mouth 4 (four) times daily as needed (indigestion).     Calcium Carbonate Antacid 400 MG CHEW Chew 800 mg by mouth every 4 (four) hours as needed (heartburn).    calcium-vitamin D (OSCAL WITH D) 500-200 MG-UNIT tablet Take 1 tablet by mouth daily with breakfast.    cetirizine (ZYRTEC) 10 MG tablet Take 5 mg by mouth daily.  Cholecalciferol (VITAMIN D3) 1000 UNITS CAPS Take 1 capsule by mouth daily.    Associated Diagnoses: Benign hypertensive heart disease without heart failure    docusate sodium (COLACE) 100 MG capsule Take 1 capsule (100 mg total) by mouth every 12 (twelve) hours. Qty: 30 capsule, Refills: 0    fluticasone (FLONASE) 50 MCG/ACT nasal spray Place 1 spray into both nostrils daily.     menthol-cetylpyridinium (CEPACOL) 3 MG lozenge Take 1 lozenge by mouth every 2 (two) hours as needed for sore throat.    multivitamin (THERAGRAN) per tablet Take 1 tablet by mouth daily.      Polyethyl Glycol-Propyl Glycol (SYSTANE) 0.4-0.3 % SOLN Apply 1 drop to eye daily as needed (dry eyes).    polyvinyl alcohol (LIQUIFILM TEARS) 1.4 % ophthalmic solution Place 1 drop into both eyes 4 (four) times daily as needed for dry eyes.    promethazine (PHENERGAN) 25 MG suppository Place 25 mg rectally every 6 (six) hours as needed for nausea or vomiting.    ranitidine (ZANTAC) 150 MG tablet Take 150 mg by mouth 2 (two) times daily.      STOP taking these medications     metoprolol (LOPRESSOR)  50 MG tablet      spironolactone (ALDACTONE) 25 MG tablet      torsemide (DEMADEX) 20 MG tablet      losartan (COZAAR) 25 MG tablet             Outstanding Labs/Studies     Duration of Discharge Encounter   Greater than 30 minutes including physician time.  Angelena Form K PA 02/22/2015, 3:47 PM

## 2015-02-22 NOTE — Progress Notes (Signed)
Assessment/Plan: 1 CKD 4 most likely nephrosclerosis vs CIN.  2 AKI Hemodynamically mediated-oliguric 3 Hypotension persists  4 Swallowing dysfunction  5 CHF/ AFIB  P Limited options but need to try to optimize CO and BP.  ? Midodrine or pressor  Subjective: Interval History: slight increase UOP  Objective: Vital signs in last 24 hours: Temp:  [97.5 F (36.4 C)-97.8 F (36.6 C)] 97.8 F (36.6 C) (02/17 0517) Pulse Rate:  [72-119] 119 (02/17 0944) Resp:  [19-20] 20 (02/17 0944) BP: (88-100)/(35-66) 88/56 mmHg (02/17 0944) SpO2:  [97 %-100 %] 100 % (02/17 0944) Weight:  [74.98 kg (165 lb 4.8 oz)] 74.98 kg (165 lb 4.8 oz) (02/17 0517) Weight change: -3.311 kg (-7 lb 4.8 oz)  Intake/Output from previous day: 02/16 0701 - 02/17 0700 In: 482 [P.O.:420; IV Piggyback:62] Out: 326 [Urine:325; Stool:1] Intake/Output this shift: Total I/O In: 120 [P.O.:120] Out: -   General appearance: alert and HOH Resp: diminished breath sounds bilaterally Chest wall: no tenderness Cardio: irregularly irregular rhythm Extremities: edema 2+ edema  Lab Results: No results for input(s): WBC, HGB, HCT, PLT in the last 72 hours. BMET:  Recent Labs  02/20/15 0520 02/21/15 0420  NA 131* 132*  K 4.4 4.6  CL 99* 97*  CO2 20* 18*  GLUCOSE 101* 90  BUN 112* 123*  CREATININE 4.10* 4.50*  CALCIUM 8.7* 9.1   No results for input(s): PTH in the last 72 hours. Iron Studies: No results for input(s): IRON, TIBC, TRANSFERRIN, FERRITIN in the last 72 hours. Studies/Results: Dg Chest 2 View  02/20/2015  CLINICAL DATA:  Shortness of breath.  CHF. EXAM: CHEST  2 VIEW COMPARISON:  02/14/2015. FINDINGS: Low volume film. Diffuse interstitial opacity is associated with alveolar opacity at the lung bases. There is retrocardiac collapse/consolidation with left pleural effusion. The cardio pericardial silhouette is enlarged. Bones are diffusely demineralized. Chronic rotator cuff disease noted in both  shoulders. Telemetry leads overlie the chest. IMPRESSION: Cardiomegaly with features of pulmonary edema. Retrocardiac left base collapse/ consolidation with pleural effusion. Right basilar atelectasis. Electronically Signed   By: Misty Stanley M.D.   On: 02/20/2015 17:32    Scheduled: . acetaminophen  500 mg Oral TID  . amiodarone  400 mg Oral Daily  . clotrimazole  10 mg Oral 5 X Daily  . docusate sodium  100 mg Oral Q12H  . famotidine  20 mg Oral Daily  . feeding supplement (ENSURE ENLIVE)  237 mL Oral BID BM  . furosemide  120 mg Intravenous Q12H  . levothyroxine  88 mcg Oral QAC breakfast  . loratadine  10 mg Oral Daily  . multivitamin with minerals  1 tablet Oral Daily  . sodium bicarbonate  1,300 mg Oral BID  . sodium chloride flush  3 mL Intravenous Q12H     LOS: 8 days   Rhyse Loux C 02/22/2015,10:00 AM

## 2015-02-22 NOTE — Progress Notes (Signed)
Attempted report [id number] to [employer].

## 2015-02-22 NOTE — Progress Notes (Signed)
ReDS Vest Discharge Study  Results of ReDS reading  Your patient is in the Unblinded arm of the Vest at Discharge study.  The ReDS reading is:   ( < 39) = 38  Your patient is ok for discharge.    Thank You   The research team

## 2015-02-25 ENCOUNTER — Encounter: Payer: Self-pay | Admitting: Nurse Practitioner

## 2015-02-25 ENCOUNTER — Encounter: Payer: Self-pay | Admitting: Internal Medicine

## 2015-02-25 ENCOUNTER — Non-Acute Institutional Stay (SKILLED_NURSING_FACILITY): Payer: Medicare Other | Admitting: Nurse Practitioner

## 2015-02-25 DIAGNOSIS — K219 Gastro-esophageal reflux disease without esophagitis: Secondary | ICD-10-CM | POA: Diagnosis not present

## 2015-02-25 DIAGNOSIS — F411 Generalized anxiety disorder: Secondary | ICD-10-CM | POA: Diagnosis not present

## 2015-02-25 DIAGNOSIS — J181 Lobar pneumonia, unspecified organism: Principal | ICD-10-CM

## 2015-02-25 DIAGNOSIS — E039 Hypothyroidism, unspecified: Secondary | ICD-10-CM | POA: Diagnosis not present

## 2015-02-25 DIAGNOSIS — I5042 Chronic combined systolic (congestive) and diastolic (congestive) heart failure: Secondary | ICD-10-CM

## 2015-02-25 DIAGNOSIS — N189 Chronic kidney disease, unspecified: Secondary | ICD-10-CM

## 2015-02-25 DIAGNOSIS — K5900 Constipation, unspecified: Secondary | ICD-10-CM

## 2015-02-25 DIAGNOSIS — J189 Pneumonia, unspecified organism: Secondary | ICD-10-CM

## 2015-02-25 DIAGNOSIS — I480 Paroxysmal atrial fibrillation: Secondary | ICD-10-CM | POA: Diagnosis not present

## 2015-02-25 DIAGNOSIS — N289 Disorder of kidney and ureter, unspecified: Secondary | ICD-10-CM | POA: Diagnosis not present

## 2015-02-25 NOTE — Assessment & Plan Note (Signed)
02/14/15 to 02/22/15 hospitalized for new onset of Afib RVR, heart rate is controlled near 100 npm, acute renal disease with creat 4.79 02/22/15, nephrology recommended palliative care, will f/u CMP, consider hospice service if no improvement. Off diuretics, ARB, BCB

## 2015-02-25 NOTE — Assessment & Plan Note (Signed)
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

## 2015-02-25 NOTE — Assessment & Plan Note (Signed)
Heart rate is in control, continue Amiodarone.  

## 2015-02-25 NOTE — Assessment & Plan Note (Signed)
Stable, continue Zantac and prn Mytanta.

## 2015-02-25 NOTE — Assessment & Plan Note (Signed)
02/14/15 to 02/22/15 hospitalized for new onset of Afib RVR, heart rate is controlled near 100 npm, acute renal disease with creat 4.79 02/22/15, nephrology recommended palliative care, will f/u CMP, consider hospice service if no improvement. Off diuretics, ARB, BCB D-CHF

## 2015-02-25 NOTE — Assessment & Plan Note (Addendum)
Stable, continue Colace bid, prn Phenergan.

## 2015-02-25 NOTE — Progress Notes (Signed)
This encounter was created in error - please disregard.

## 2015-02-25 NOTE — Progress Notes (Signed)
Patient ID: Ann [name], female   DOB: 1918-08-19, 82 y.o.   MRN: [id number]  Location: [facility] Provider:  Marlana Latus NP  Code Status:  DNR Goals of care: Advanced Directive information Does patient have an advance directive?: Yes, Type of Advance Directive: Out of facility DNR (pink MOST or yellow form)  Chief Complaint  Patient presents with  . Hospitalization Follow-up     HPI: Patient is a 82 y.o. female seen in the SNF at [facility] today for f/u hospital eval and tx of new onset of Afib, Amiodarone started, her heart rate is in control. CXR 02/23/15 done [facility] for hx of positive TB test showed right lower lobe PNA, 7 day course of Rocephin started subsequently. Hx of HTN, GERD,  thyroid, CHF   Hospitalized 02/14/15 to 02/1715 for new onset Afib RVR, heart rate is better controlled while on Amiodarone 400mg  daily. Her renal function continue to decline, last Bun/creat 02/21/14 137/4.79, off BCB, ARB, and diuretics.   Review of Systems:  Review of Systems  HENT: Positive for hearing loss and sore throat.   Eyes: Negative.   Respiratory: Positive for cough and shortness of breath.  O2 [place] Cardiovascular: Positive for leg swelling.       Hospitalized May [id number] for acute systolic and diastolic congestive heart failure.  Gastrointestinal: Positive for vomiting.  Genitourinary:       Stress incontinence. Foley Catheter Musculoskeletal:       Chronic knee pains. Uses a walker.  Skin:       Improved perirectal and labial burning  Neurological: Positive for weakness.  Psychiatric/Behavioral:       Depressive symptoms come and go.    Past Medical History  Diagnosis Date  . Mitral regurgitation   . Hypertension   . Palpitations   . Nocturnal dyspnea   . Advanced age   . Chronic diastolic CHF (congestive heart failure) ([place])     Last echo in 5/11  . Pain in joint, lower leg 02/25/2012  . Pain in joint, lower leg 02/23/2012  . Encounter for long-term (current) use of other  medications 10/02/2011  . Cough 07/17/2011  . Internal hemorrhoids without mention of complication [id number]  . Unspecified constipation 01/01/2011  . Rash and other nonspecific skin eruption 08/14/2010  . Tension headache 07/31/2010  . Benign paroxysmal positional vertigo 07/24/2010  . Cervicalgia 07/24/2010  . Urinary tract infection, site not specified 07/10/2010  . Abnormality of gait 05/09/2009  . Vitamin D deficiency 01/25/2009  . Lumbago 01/05/2008  . Cervicitis and endocervicitis 12/22/2007  . Actinic keratosis 12/22/2007  . Pain in limb 12/22/2007  . Muscle weakness (generalized) 08/04/2007  . Blepharochalasis 05/12/2007  . Insomnia, unspecified 05/12/2007  . Dermatophytosis of foot 07/14/2006  . Dizziness and giddiness 06/25/2006  . Allergic rhinitis due to pollen 04/21/2006  . Dyskinesia of esophagus 04/12/2006  . Other malaise and fatigue 04/01/2006  . Unspecified urinary incontinence 02/10/2006  . Cramp of limb 10/07/2005  . Restless legs syndrome (RLS) 06/30/2005  . CKD (chronic kidney disease), stage III   . Hypothyroidism   . Diaphragmatic hernia without mention of obstruction or gangrene 10/03/2003  . Unspecified venous (peripheral) insufficiency 01/05/1998  . Reflux esophagitis 01/06/1996  . Peptic ulcer, unspecified site, unspecified as acute or chronic, without mention of hemorrhage, perforation, or obstruction 01/06/1996  . Diverticulosis of colon (without mention of hemorrhage) 01/06/1996  . Pain in right knee 08/08/2012  . Macular degeneration of right eye 1980's  .  Macular degeneration 09/21/2013  . Varicose vein of leg 02/08/2014  . Mitral valve regurgitation     Moderate-severe by echo 05/2014  . Systolic and diastolic CHF, chronic [facility])     Patient Active Problem List   Diagnosis Date Noted  . Right lower lobe pneumonia 02/25/2015  . Cardiomyopathy, presumed ischemic-EF 40-45% 02/20/2015  . Chronic anticoagulation 02/20/2015  . Acute on chronic renal insufficiency ([place]) 02/20/2015  .  Essential hypertension 02/20/2015  . Esophageal dysmotility 02/15/2015  . Dysphagia 02/15/2015  . Paroxysmal supraventricular tachycardia ([place]) 02/14/2015  . Nausea with vomiting 02/14/2015  . Acute on chronic combined systolic and diastolic congestive heart failure, NYHA class 4 ([place]) 02/14/2015  . Paroxysmal atrial fibrillation with rapid ventricular response ([place]) 02/14/2015  . Atrial fibrillation with RVR ([place])   . Constipation 10/24/2014  . Musculoskeletal chest pain 09/27/2014  . Generalized anxiety disorder 07/16/2014  . Right knee pain 06/14/2014  . Dyspnea 06/14/2014  . Systolic and diastolic CHF, chronic ([place]) 05/27/2014  . Severe mitral regurgitation 05/26/2014  . Bunion of great toe 02/08/2014  . Varicose vein of leg 02/08/2014  . Macular degeneration 09/21/2013  . Atrophic vaginitis 07/13/2013  . Urinary incontinence 09/22/2012  . Diplopia 09/12/2012  . Chronic kidney disease, stage III (moderate) 08/08/2012  . PVC's (premature ventricular contractions) 09/08/2011  . Hypothyroidism-Synthroid increased 04/17/2010  . Spinal stenosis, lumbar 04/17/2010  . Abnormality of gait 05/09/2009  . Reflux esophagitis 01/06/1996  . GERD (gastroesophageal reflux disease) 01/06/1996    Allergies  Allergen Reactions  . Protonix [Pantoprazole Sodium] Rash  . Ace Inhibitors Cough  . Amoxicillin Diarrhea  . Biaxin [Clarithromycin]     Noted on info sheet from independent living  . Codeine   . Darvon   . Ibuprofen Other (See Comments)    Noted on info sheet from independent living  . Mercurochrome [Merbromin (Mercurochrome)]   . Pantoprazole     Dizzy  . Prilosec [Omeprazole] Other (See Comments)    Noted on info sheet from independent living  . Septra [Sulfamethoxazole-Trimethoprim]   . Sulfa Drugs Cross Reactors   . Tramadol     Abnormal kidney function  . Vesicare [Solifenacin]     Drys out nose and throat     Medications: Patient's Medications  New Prescriptions    No medications on file  Previous Medications   ACETAMINOPHEN (TYLENOL) 500 MG TABLET    Take 1 tablet (500 mg total) by mouth every 4 (four) hours as needed for mild pain or moderate pain.   ALPRAZOLAM (XANAX) 0.25 MG TABLET    Take 1 tablet (0.25 mg total) by mouth 2 (two) times daily as needed for anxiety.   AMIODARONE (PACERONE) 400 MG TABLET    Take 1 tablet (400 mg total) by mouth daily.   ARTIFICIAL SALIVA (BIOTENE MOISTURIZING MOUTH MT)    Use as directed in the mouth or throat. Use as directed   ASPIRIN EC 81 MG TABLET    Take 81 mg by mouth daily.   CALCIUM & MAGNESIUM CARBONATES (MYLANTA PO)    Take 30 mLs by mouth 4 (four) times daily as needed (indigestion). Reported on 02/25/2015   CALCIUM CARBONATE ANTACID 400 MG CHEW    Chew 800 mg by mouth every 4 (four) hours as needed (heartburn).   CALCIUM-VITAMIN D (OSCAL WITH D) 500-200 MG-UNIT TABLET    Take 1 tablet by mouth daily with breakfast.   CETIRIZINE (ZYRTEC) 10 MG TABLET    Take 5 mg by mouth  daily.    CHOLECALCIFEROL (VITAMIN D3) 1000 UNITS CAPS    Take 1 capsule by mouth daily.    DOCUSATE SODIUM (COLACE) 100 MG CAPSULE    Take 1 capsule (100 mg total) by mouth every 12 (twelve) hours.   FLUTICASONE (FLONASE) 50 MCG/ACT NASAL SPRAY    Place 1 spray into both nostrils daily.    LEVOTHYROXINE (SYNTHROID, LEVOTHROID) 88 MCG TABLET    Take 1 tablet (88 mcg total) by mouth daily before breakfast.   MENTHOL-CETYLPYRIDINIUM (CEPACOL) 3 MG LOZENGE    Take 1 lozenge by mouth every 2 (two) hours as needed for sore throat.   MULTIVITAMIN (THERAGRAN) PER TABLET    Take 1 tablet by mouth daily.     POLYETHYL GLYCOL-PROPYL GLYCOL (SYSTANE) 0.4-0.3 % SOLN    Apply 1 drop to eye daily as needed (dry eyes).   POLYVINYL ALCOHOL (LIQUIFILM TEARS) 1.4 % OPHTHALMIC SOLUTION    Place 1 drop into both eyes 4 (four) times daily as needed for dry eyes.   PROMETHAZINE (PHENERGAN) 25 MG SUPPOSITORY    Place 25 mg rectally every 6 (six) hours as needed for  nausea or vomiting.   RANITIDINE (ZANTAC) 150 MG TABLET    Take 150 mg by mouth 2 (two) times daily.  Modified Medications   No medications on file  Discontinued Medications   No medications on file    Physical Exam: Filed Vitals:   02/25/15 0917  BP: 121/62  Pulse: 102  Temp: 96.6 F (35.9 C)  TempSrc: Oral  Resp: 22   There is no weight on file to calculate BMI.  Physical Exam  Constitutional: She is oriented to person, place, and time. She appears well-developed and well-nourished. No distress.  HENT:  Head: Normocephalic and atraumatic.  Right Ear: External ear normal.  Left Ear: External ear normal.  Nose: Nose normal.  Mouth/Throat: Oropharynx is clear and moist.  Loss of hearing bilaterally.  Eyes: Conjunctivae and EOM are normal. Pupils are equal, round, and reactive to light.  Corrective lenses and prisms on her glasses.  Neck: No JVD present. No tracheal deviation present. No thyromegaly present.  Cardiovascular:  Irregular rhythm. Grade 3/6 systolic ejection murmur at left sternal border. No heaves or thrills on palpation. Dorsalis pedis and posterior tibial arteries have diminished pulses bilaterally. Varicose leg veins bilaterally. EKG 02/14/15 no P waves, HR 149  Pulmonary/Chest: She has rales. O2 [place] rales and wheezes posterior mid to lower lungs, R>L  Abdominal: She exhibits no distension and no mass. There is no tenderness.  Genitourinary:   labial atrophy Foley Catheter Musculoskeletal: She exhibits edema.  Exquisitely tender in the medial side of the right knee. Crepitance in both knees. She is able to use her walker and a cane and walk unassisted. Trace edema in ankles.   Lymphadenopathy:    She has no cervical adenopathy.  Neurological: She is alert and oriented to person, place, and time. No cranial nerve deficit. Coordination normal.  Reduced vibratory sensation in the right leg and foot.  Skin: No rash noted. No erythema. No pallor.    Psychiatric: She has a normal mood and affect. Her behavior is normal. Judgment and thought content normal.    Labs reviewed: Basic Metabolic Panel:  Recent Labs  02/14/15 1742  02/20/15 0520 02/21/15 0420 02/22/15 1050  NA  --   < > 131* 132* 132*  K  --   < > 4.4 4.6 4.3  CL  --   < >  99* 97* 97*  CO2  --   < > 20* 18* 20*  GLUCOSE  --   < > 101* 90 102*  BUN  --   < > 112* 123* 137*  CREATININE  --   < > 4.10* 4.50* 4.79*  CALCIUM  --   < > 8.7* 9.1 8.5*  MG 2.3  --   --   --   --   PHOS  --   --   --  5.9*  --   < > = values in this interval not displayed.  Liver Function Tests:  Recent Labs  05/24/14 0326  01/30/15 02/14/15 02/14/15 1821 02/21/15 0420  AST 30  < > 23 23 29   --   ALT 18  < > 11 17 20   --   ALKPHOS 103  < > 84 95 91  --   BILITOT 0.8  --   --   --  0.9  --   PROT 7.1  --   --   --  6.4*  --   ALBUMIN 3.9  --   --   --  3.2* 2.7*  < > = values in this interval not displayed.  CBC:  Recent Labs  12/07/14 1908  02/14/15 02/14/15 1821 02/18/15 0412 02/20/15 2015  WBC 8.2  < > 9.4 7.7 7.1  --   NEUTROABS 5.0  --   --  5.3  --  4.6  HGB 12.1  < > 11.7* 11.8* 11.2*  --   HCT 36.6  < > 34* 34.2* 32.7*  --   MCV 98.4  --   --  98.8 98.2  --   PLT 246  < > 282 266 240  --   < > = values in this interval not displayed.  Lab Results  Component Value Date   TSH 4.813* 02/14/2015   Lab Results  Component Value Date   HGBA1C 5.4 07/13/2014   Lab Results  Component Value Date   CHOL 174 06/07/2012   HDL 65 06/07/2012   LDLCALC 89 06/07/2012   TRIG 98 06/07/2012    Significant Diagnostic Results since last visit: none  Patient Care Team: Estill Dooms, MD as PCP - General (Internal Medicine) Darlin Coco, MD as Consulting Physician (Cardiology) [place]  Assessment/Plan Problem List Items Addressed This Visit    Hypothyroidism-Synthroid increased (Chronic)    02/14/15 TSH 4.5 Continue Levothyroxine 56mcg daily.         Systolic and diastolic CHF, chronic (HCC) (Chronic)    02/14/15 to 02/22/15 hospitalized for new onset of Afib RVR, heart rate is controlled near 100 npm, acute renal disease with creat 4.79 02/22/15, nephrology recommended palliative care, will f/u CMP, consider hospice service if no improvement. Off diuretics, ARB, BCB D-CHF       GERD (gastroesophageal reflux disease)    Stable, continue Zantac and prn Mytanta.       Generalized anxiety disorder    Prn Xanax bid prn available to her for anxiety and muscle spasm.       Constipation    Stable, continue Colace bid, prn Phenergan.       Paroxysmal atrial fibrillation with rapid ventricular response (HCC)    Heart rate is in control, continue Amiodarone.       Acute on chronic renal insufficiency ([place])    02/14/15 to 02/22/15 hospitalized for new onset of Afib RVR, heart rate is controlled near 100 npm, acute renal disease with creat  4.79 02/22/15, nephrology recommended palliative care, will f/u CMP, consider hospice service if no improvement. Off diuretics, ARB, BCB      Right lower lobe pneumonia - Primary     CXR 02/23/15 done [facility] for hx of positive TB test showed right lower lobe PNA, 7 day course of Rocephin           Family/ staff Communication: SNF for care needs, may consider Hospice service.   Labs/tests ordered:  CBC, CMP  ManXie Monroe Qin NP [facility] [address], [place] [address] On Call:  [phone] & follow prompts after 5pm & weekends Office Phone:  [phone] Office Fax:  [phone]

## 2015-02-25 NOTE — Assessment & Plan Note (Signed)
02/14/15 TSH 4.5 Continue Levothyroxine 75mcg daily.

## 2015-02-25 NOTE — Assessment & Plan Note (Signed)
Prn Xanax bid prn available to her for anxiety and muscle spasm.

## 2015-02-26 LAB — CBC AND DIFFERENTIAL
HEMATOCRIT: 37 % (ref 36–46)
HEMOGLOBIN: 12 g/dL (ref 12.0–16.0)
PLATELETS: 315 10*3/uL (ref 150–399)
WBC: 5.5 10*3/mL

## 2015-02-26 LAB — BASIC METABOLIC PANEL
BUN: 101 mg/dL — AB (ref 4–21)
CREATININE: 3 mg/dL — AB (ref 0.5–1.1)
Glucose: 72 mg/dL
Potassium: 4 mmol/L (ref 3.4–5.3)
Sodium: 139 mmol/L (ref 137–147)

## 2015-02-26 LAB — HEPATIC FUNCTION PANEL
ALK PHOS: 118 U/L (ref 25–125)
ALT: 9 U/L (ref 7–35)
AST: 14 U/L (ref 13–35)
BILIRUBIN, TOTAL: 0.7 mg/dL

## 2015-02-28 ENCOUNTER — Encounter: Payer: Self-pay | Admitting: *Deleted

## 2015-02-28 ENCOUNTER — Non-Acute Institutional Stay (SKILLED_NURSING_FACILITY): Payer: Medicare Other | Admitting: Internal Medicine

## 2015-02-28 DIAGNOSIS — I5042 Chronic combined systolic (congestive) and diastolic (congestive) heart failure: Secondary | ICD-10-CM | POA: Diagnosis not present

## 2015-02-28 DIAGNOSIS — K224 Dyskinesia of esophagus: Secondary | ICD-10-CM | POA: Diagnosis not present

## 2015-02-28 DIAGNOSIS — I10 Essential (primary) hypertension: Secondary | ICD-10-CM | POA: Diagnosis not present

## 2015-02-28 DIAGNOSIS — Z7901 Long term (current) use of anticoagulants: Secondary | ICD-10-CM | POA: Diagnosis not present

## 2015-02-28 DIAGNOSIS — R1310 Dysphagia, unspecified: Secondary | ICD-10-CM

## 2015-02-28 DIAGNOSIS — N184 Chronic kidney disease, stage 4 (severe): Secondary | ICD-10-CM | POA: Diagnosis not present

## 2015-02-28 NOTE — Progress Notes (Signed)
Patient ID: Ann [name], female   DOB: Jun 04, 1918, 82 y.o.   MRN: [id number]  Provider:  Dr. Jeanmarie Hubert Location:  [place] Room Number: [place] of Service:  SNF (31)  PCP: Estill Dooms, MD Patient Care Team: Estill Dooms, MD as PCP - General (Internal Medicine) Darlin Coco, MD as Consulting Physician (Cardiology) [place]  Extended Emergency Contact Information Primary Emergency Contact: Edyth Gunnels [place] Phone: [phone] Relation: Daughter  Goals of Care: Advanced Directive information Advanced Directives 02/25/2015  Does patient have an advance directive? Yes  Type of Advance Directive Out of facility DNR (pink MOST or yellow form)  Copy of advanced directive(s) in chart? Yes      Chief Complaint  Patient presents with  . New Admit To SNF    New Admit to SNF    HPI: Patient is a 82 y.o. female seen today for admission to [facility]. Hospitalized 02/14/2015 through 02/22/2015. Problems while hospitalized include atrial fibrillation with rapid ventricular rate, deterioration in renal function, acute on chronic combined systolic and diastolic congestive heart failure, severe mitral regurgitation, esophageal dysmotility and dysphagia, cardiomyopathy with ischemic ejection fraction of 40-45%.  At the time of discharge from the hospital, her BUN was 100 and creatinine nearly 5. She is having difficulty swallowing. Prognosis is quite poor unless swallowing improves and renal function improves.  Past Medical History  Diagnosis Date  . Mitral regurgitation   . Hypertension   . Palpitations   . Nocturnal dyspnea   . Advanced age   . Chronic diastolic CHF (congestive heart failure) ([place])     Last echo in 5/11  . Pain in joint, lower leg 02/25/2012  . Pain in joint, lower leg 02/23/2012  . Encounter for long-term (current) use of other medications 10/02/2011  . Cough 07/17/2011  .  Internal hemorrhoids without mention of complication [id number]  . Unspecified constipation 01/01/2011  . Rash and other nonspecific skin eruption 08/14/2010  . Tension headache 07/31/2010  . Benign paroxysmal positional vertigo 07/24/2010  . Cervicalgia 07/24/2010  . Urinary tract infection, site not specified 07/10/2010  . Abnormality of gait 05/09/2009  . Vitamin D deficiency 01/25/2009  . Lumbago 01/05/2008  . Cervicitis and endocervicitis 12/22/2007  . Actinic keratosis 12/22/2007  . Pain in limb 12/22/2007  . Muscle weakness (generalized) 08/04/2007  . Blepharochalasis 05/12/2007  . Insomnia, unspecified 05/12/2007  . Dermatophytosis of foot 07/14/2006  . Dizziness and giddiness 06/25/2006  . Allergic rhinitis due to pollen 04/21/2006  . Dyskinesia of esophagus 04/12/2006  . Other malaise and fatigue 04/01/2006  . Unspecified urinary incontinence 02/10/2006  . Cramp of limb 10/07/2005  . Restless legs syndrome (RLS) 06/30/2005  . CKD (chronic kidney disease), stage III   . Hypothyroidism   . Diaphragmatic hernia without mention of obstruction or gangrene 10/03/2003  . Unspecified venous (peripheral) insufficiency 01/05/1998  . Reflux esophagitis 01/06/1996  . Peptic ulcer, unspecified site, unspecified as acute or chronic, without mention of hemorrhage, perforation, or obstruction 01/06/1996  . Diverticulosis of colon (without mention of hemorrhage) 01/06/1996  . Pain in right knee 08/08/2012  . Macular degeneration of right eye 1980's  . Macular degeneration 09/21/2013  . Varicose vein of leg 02/08/2014  . Mitral valve regurgitation     Moderate-severe by echo 05/2014  . Systolic and diastolic CHF, chronic ([place])    Past Surgical History  Procedure Laterality Date  . Tonsillectomy and adenoidectomy  1940  . Appendectomy  1940's  . Oophorectomy  1940's  . Breast surgery  1950's    benign tumors removed  . Cataract extraction w/ intraocular lens  implant, bilateral  1982  . Lesion excision  04/2002     melanomas of scalp  Dr. Link Snuffer  . Skin cancer excision  2005    BCC of right temple  . Cholecystectomy, laparoscopic  10/2004    Dr. Deon Pilling  . Tooth extraction  06/2008    and bone implant  Dr. Matilde Haymaker  . Colonoscopy  2000    reports that she has never smoked. She has never used smokeless tobacco. She reports that she does not drink alcohol or use illicit drugs. Social History   Social History  . Marital Status: Widowed    Spouse Name: N/A  . Number of Children: N/A  . Years of Education: N/A   Occupational History  . retired Sales promotion account executive    Social History Main Topics  . Smoking status: Never Smoker   . Smokeless tobacco: Never Used  . Alcohol Use: No  . Drug Use: No  . Sexual Activity: No   Other Topics Concern  . Not on file   Social History Narrative   Lives at [facility] since 2008   Widowed 2006   Walks with walker          Family History  Problem Relation Age of Onset  . Stroke Mother   . Heart disease Father   . Diabetes Father   . Heart disease Brother     MI  . Alcohol abuse Sister     Health Maintenance  Topic Date Due  . DEXA SCAN  10/23/1985  . PNA vac Low Risk Adult (2 of 2 - PCV13) 01/06/2007  . INFLUENZA VACCINE  08/06/2015  . TETANUS/TDAP  01/06/2016  . ZOSTAVAX  Completed    Allergies  Allergen Reactions  . Protonix [Pantoprazole Sodium] Rash  . Ace Inhibitors Cough  . Amoxicillin Diarrhea  . Biaxin [Clarithromycin]     Noted on info sheet from independent living  . Codeine   . Darvon   . Ibuprofen Other (See Comments)    Noted on info sheet from independent living  . Mercurochrome [Merbromin (Mercurochrome)]   . Pantoprazole     Dizzy  . Prilosec [Omeprazole] Other (See Comments)    Noted on info sheet from independent living  . Septra [Sulfamethoxazole-Trimethoprim]   . Sulfa Drugs Cross Reactors   . Tramadol     Abnormal kidney function  . Vesicare [Solifenacin]     Drys out nose and throat        Medication List       This list is accurate as of: 02/28/15  2:34 PM.  Always use your most recent med list.               acetaminophen 500 MG tablet  Commonly known as:  TYLENOL  Take 1 tablet (500 mg total) by mouth every 4 (four) hours as needed for mild pain or moderate pain.     ALPRAZolam 0.25 MG tablet  Commonly known as:  XANAX  Take 1 tablet (0.25 mg total) by mouth 2 (two) times daily as needed for anxiety.     amiodarone 400 MG tablet  Commonly known as:  PACERONE  Take 1 tablet (400 mg total) by mouth daily.     aspirin EC 81 MG tablet  Take 81 mg by mouth daily.  BIOTENE MOISTURIZING MOUTH MT  Use as directed in the mouth or throat. Use as directed     Calcium Carbonate Antacid 400 MG Chew  Chew 800 mg by mouth every 4 (four) hours as needed (heartburn).     calcium-vitamin D 500-200 MG-UNIT tablet  Commonly known as:  OSCAL WITH D  Take 1 tablet by mouth daily with breakfast.     cetirizine 10 MG tablet  Commonly known as:  ZYRTEC  Take 5 mg by mouth daily.     docusate sodium 100 MG capsule  Commonly known as:  COLACE  Take 1 capsule (100 mg total) by mouth every 12 (twelve) hours.     fluticasone 50 MCG/ACT nasal spray  Commonly known as:  FLONASE  Place 1 spray into both nostrils daily.     levothyroxine 88 MCG tablet  Commonly known as:  SYNTHROID, LEVOTHROID  Take 1 tablet (88 mcg total) by mouth daily before breakfast.     menthol-cetylpyridinium 3 MG lozenge  Commonly known as:  CEPACOL  Take 1 lozenge by mouth every 2 (two) hours as needed for sore throat.     multivitamin per tablet  Take 1 tablet by mouth daily.     MYLANTA PO  Take 30 mLs by mouth 4 (four) times daily as needed (indigestion). Reported on 02/25/2015     polyvinyl alcohol 1.4 % ophthalmic solution  Commonly known as:  LIQUIFILM TEARS  Place 1 drop into both eyes 4 (four) times daily as needed for dry eyes.     promethazine 25 MG suppository  Commonly known  as:  PHENERGAN  Place 25 mg rectally every 6 (six) hours as needed for nausea or vomiting.     ranitidine 150 MG tablet  Commonly known as:  ZANTAC  Take 150 mg by mouth 2 (two) times daily.     SYSTANE 0.4-0.3 % Soln  Generic drug:  Polyethyl Glycol-Propyl Glycol  Apply 1 drop to eye daily as needed (dry eyes).     Vitamin D3 1000 units Caps  Take 1 capsule by mouth daily.        Review of Systems  Constitutional: Negative.  Negative for fever, chills, diaphoresis, activity change, appetite change, fatigue and unexpected weight change.  HENT: Positive for hearing loss and sore throat. Negative for congestion, ear discharge, ear pain, postnasal drip, rhinorrhea, tinnitus, trouble swallowing and voice change.   Eyes: Negative.  Negative for pain, redness, itching and visual disturbance.  Respiratory: Positive for cough and shortness of breath. Negative for choking and wheezing.   Cardiovascular: Positive for leg swelling. Negative for chest pain and palpitations.       History of chronic combined systolic and diastolic congestive heart failure.poor ejection fraction and cardiomyopathy, likely ischemic origin. History of atrial fibrillation with rapid ventricular response.  Gastrointestinal: Negative.  Negative for nausea, abdominal pain, diarrhea, constipation and abdominal distention.       Significant issues of dysphagia. Chokes on nearly everything. Feels like food sticks in her chest.  Endocrine: Negative for cold intolerance, heat intolerance, polydipsia, polyphagia and polyuria.  Genitourinary: Negative for dysuria, urgency, frequency, hematuria, flank pain, vaginal discharge, difficulty urinating and pelvic pain.       Stress incontinence.  Musculoskeletal: Negative for myalgias, back pain, arthralgias, gait problem, neck pain and neck stiffness.       Chronic knee pains. Uses a walker.  Skin: Negative for color change, pallor and rash.       Improved perirectal and labial  burning  Allergic/Immunologic: Negative.   Neurological: Negative.  Negative for dizziness, tremors, seizures, syncope, weakness, numbness and headaches.  Hematological: Negative for adenopathy. Does not bruise/bleed easily.  Psychiatric/Behavioral: Negative for suicidal ideas, hallucinations, behavioral problems, confusion, sleep disturbance, dysphoric mood and agitation. The patient is not nervous/anxious and is not hyperactive.        Depressive symptoms come and go.    Filed Vitals:   02/28/15 1431  BP: 134/85  Pulse: 70  Temp: 97.1 F (36.2 C)  TempSrc: Oral  Resp: 24  Height: 5\' 3"  (1.6 m)  Weight: 165 lb (74.844 kg)   Body mass index is 29.24 kg/(m^2). Physical Exam  Constitutional: She is oriented to person, place, and time. She appears well-developed and well-nourished. No distress.  HENT:  Head: Normocephalic and atraumatic.  Right Ear: External ear normal.  Left Ear: External ear normal.  Nose: Nose normal.  Mouth/Throat: Oropharynx is clear and moist.  Loss of hearing bilaterally.  Eyes: Conjunctivae and EOM are normal. Pupils are equal, round, and reactive to light.  Corrective lenses and prisms on her glasses.  Neck: No JVD present. No tracheal deviation present. No thyromegaly present.  Cardiovascular:  Regular rhythm. Grade 3/6 systolic ejection murmur at left sternal border. No heaves or thrills on palpation. Dorsalis pedis and posterior tibial arteries have diminished pulses bilaterally. Varicose leg veins bilaterally.  Pulmonary/Chest: She has rales.  Coarse rales and wheezes posterior mid to lower lungs, R>L  Abdominal: She exhibits no distension and no mass. There is no tenderness.  Genitourinary:   labial atrophy  Musculoskeletal: She exhibits edema.  Exquisitely tender in the medial side of the right knee. Crepitance in both knees. She is able to use her walker and a cane and walk unassisted. Trace edema in ankles.   Lymphadenopathy:    She has no  cervical adenopathy.  Neurological: She is alert and oriented to person, place, and time. No cranial nerve deficit. Coordination normal.  Reduced vibratory sensation in the right leg and foot.  Skin: No rash noted. No erythema. No pallor.  Psychiatric: She has a normal mood and affect. Her behavior is normal. Judgment and thought content normal.    Labs reviewed: Basic Metabolic Panel:  Recent Labs  02/14/15 1742  02/20/15 0520 02/21/15 0420 02/22/15 1050 02/26/15  NA  --   < > 131* 132* 132* 139  K  --   < > 4.4 4.6 4.3 4.0  CL  --   < > 99* 97* 97*  --   CO2  --   < > 20* 18* 20*  --   GLUCOSE  --   < > 101* 90 102*  --   BUN  --   < > 112* 123* 137* 101*  CREATININE  --   < > 4.10* 4.50* 4.79* 3.0*  CALCIUM  --   < > 8.7* 9.1 8.5*  --   MG 2.3  --   --   --   --   --   PHOS  --   --   --  5.9*  --   --   < > = values in this interval not displayed. Liver Function Tests:  Recent Labs  05/24/14 0326  02/14/15 02/14/15 1821 02/21/15 0420 02/26/15  AST 30  < > 23 29  --  14  ALT 18  < > 17 20  --  9  ALKPHOS 103  < > 95 91  --  118  BILITOT  0.8  --   --  0.9  --   --   PROT 7.1  --   --  6.4*  --   --   ALBUMIN 3.9  --   --  3.2* 2.7*  --   < > = values in this interval not displayed. No results for input(s): LIPASE, AMYLASE in the last 8760 hours. No results for input(s): AMMONIA in the last 8760 hours. CBC:  Recent Labs  12/07/14 1908  02/14/15 1821 02/18/15 0412 02/20/15 2015 02/26/15  WBC 8.2  < > 7.7 7.1  --  5.5  NEUTROABS 5.0  --  5.3  --  4.6  --   HGB 12.1  < > 11.8* 11.2*  --  12.0  HCT 36.6  < > 34.2* 32.7*  --  37  MCV 98.4  --  98.8 98.2  --   --   PLT 246  < > 266 240  --  315  < > = values in this interval not displayed. Cardiac Enzymes:  Recent Labs  02/14/15 2233 02/15/15 0420 02/15/15 0957  TROPONINI 0.08* 0.08* 0.07*   BNP: Invalid input(s): POCBNP Lab Results  Component Value Date   HGBA1C 5.4 07/13/2014   Lab Results    Component Value Date   TSH 4.813* 02/14/2015   No results found for: VITAMINB12 No results found for: FOLATE No results found for: IRON, TIBC, FERRITIN  Imaging and Procedures obtained prior to SNF admission: Dg Chest Port 1 View  02/14/2015  CLINICAL DATA:  Tachycardia starting this morning. Recent bronchitis. EXAM: PORTABLE CHEST 1 VIEW COMPARISON:  09/27/2014 FINDINGS: Defibrillator patch is noted. Atherosclerotic calcification of the aortic arch. Stable mild enlargement of the cardiopericardial silhouette. Upper zone pulmonary vascular prominence is indeterminate due to the semi erect positioning. Bilateral chronic rotator cuff tears with delamination of the acromion humeral space. No significant blunting of the costophrenic angles. IMPRESSION: 1. Stable mild enlargement of the cardiopericardial silhouette. There is upper zone pulmonary vascular prominence which is nonspecific given the semi erect positioning, but which could reflect pulmonary venous hypertension. No overt edema. 2. Bilateral chronic rotator cuff tears with severe degenerative glenohumeral arthropathy bilaterally. 3. Atherosclerotic aortic arch. Electronically Signed   By: Van Clines M.D.   On: 02/14/2015 18:38    Assessment/Plan 1. Chronic kidney disease, stage 4, severely decreased GFR (HCC) Severe acute impairment on chronic disease. It is difficult to know which direction this will take. Her intake is quite compromised.  2. Systolic and diastolic CHF, chronic ([place]) Improved since hospitalization. Dyspneic with any exertion.  3. Esophageal dysmotility Chronic problem contributing to her significant dysphagia now  4. Dysphagia Very poor oral intake. Food sticks in the esophagus at midchest.  5. Essential hypertension Controlled  6. Chronic anticoagulation Due to her poor prognosis, patient is currently off anticoagulants

## 2015-03-08 ENCOUNTER — Encounter: Payer: Self-pay | Admitting: Internal Medicine

## 2015-04-03 ENCOUNTER — Ambulatory Visit: Payer: Medicare Other | Admitting: Nurse Practitioner

## 2015-04-06 DEATH — deceased

## 2015-05-16 ENCOUNTER — Encounter: Payer: Self-pay | Admitting: Internal Medicine

## 2017-04-01 IMAGING — CR DG HIP (WITH OR WITHOUT PELVIS) 2-3V*R*
3 series · 3 of 3 positions shown · non-contrast
Comparison: None.

CLINICAL DATA: Right hip pain, no known injury.

EXAM:
DG HIP (WITH OR WITHOUT PELVIS) 2-3V RIGHT

[x pelvis]
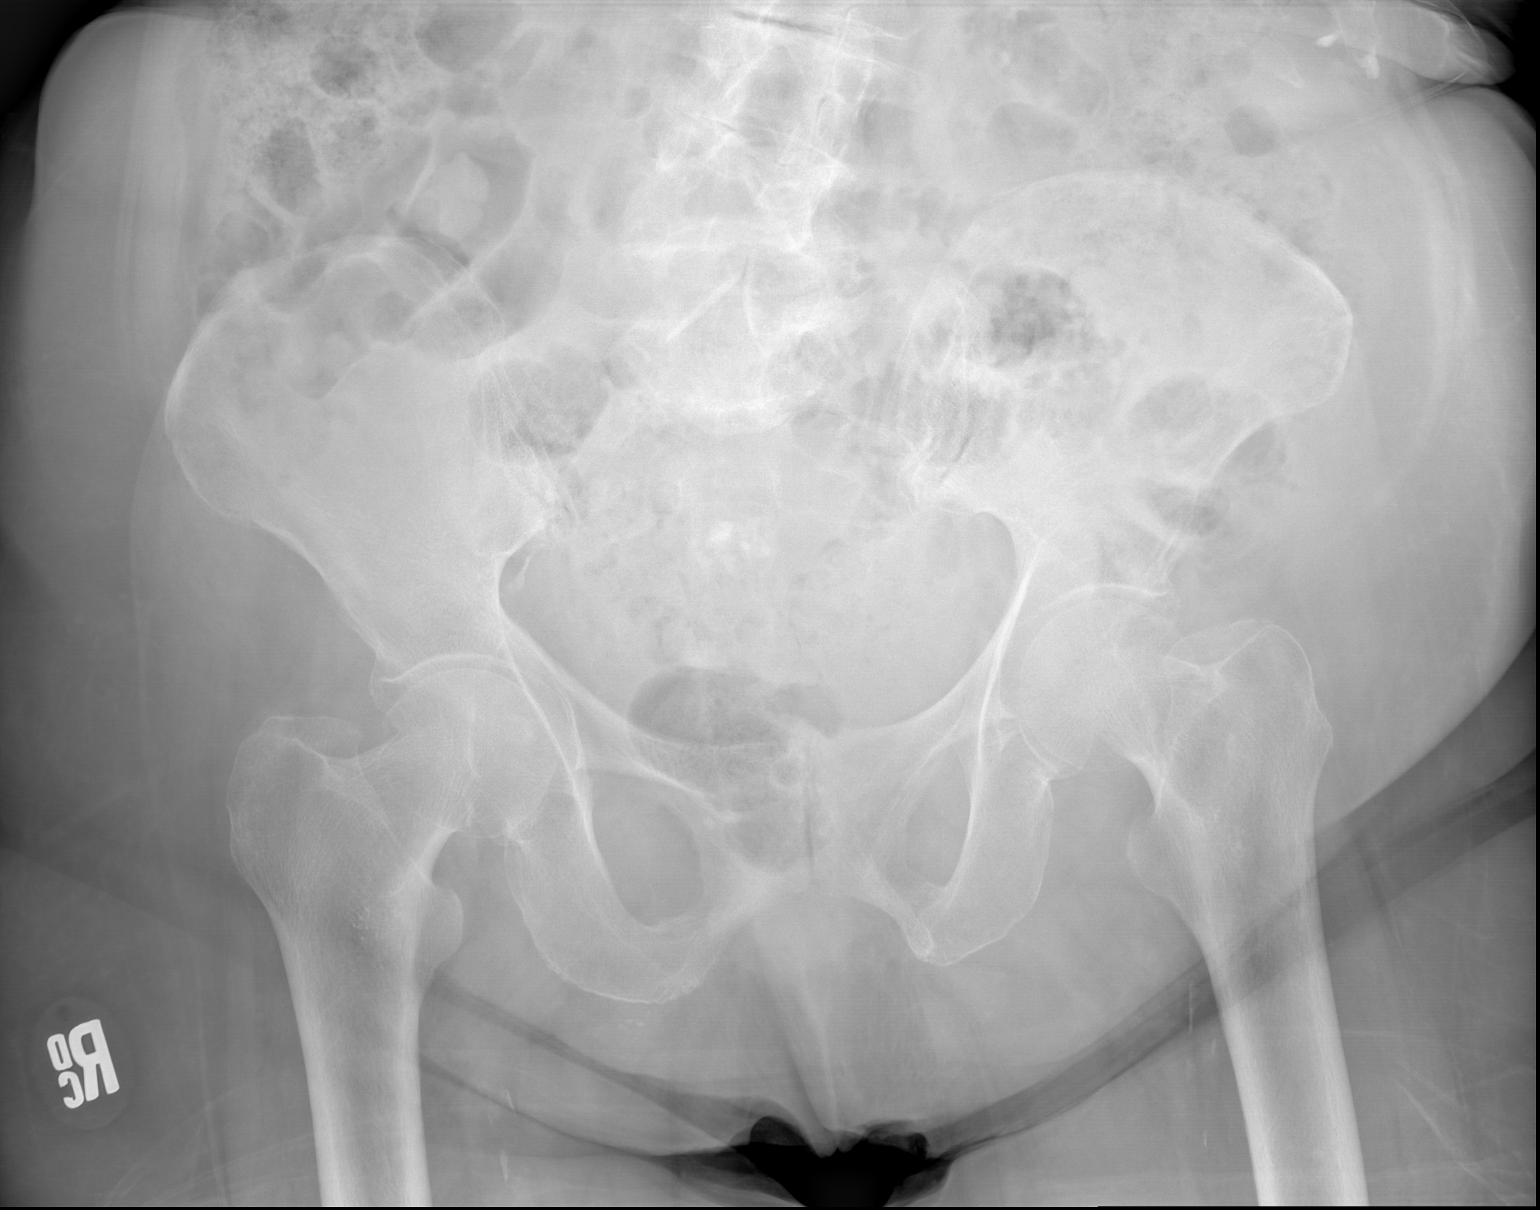

[x hip ap right (1 of 2)]
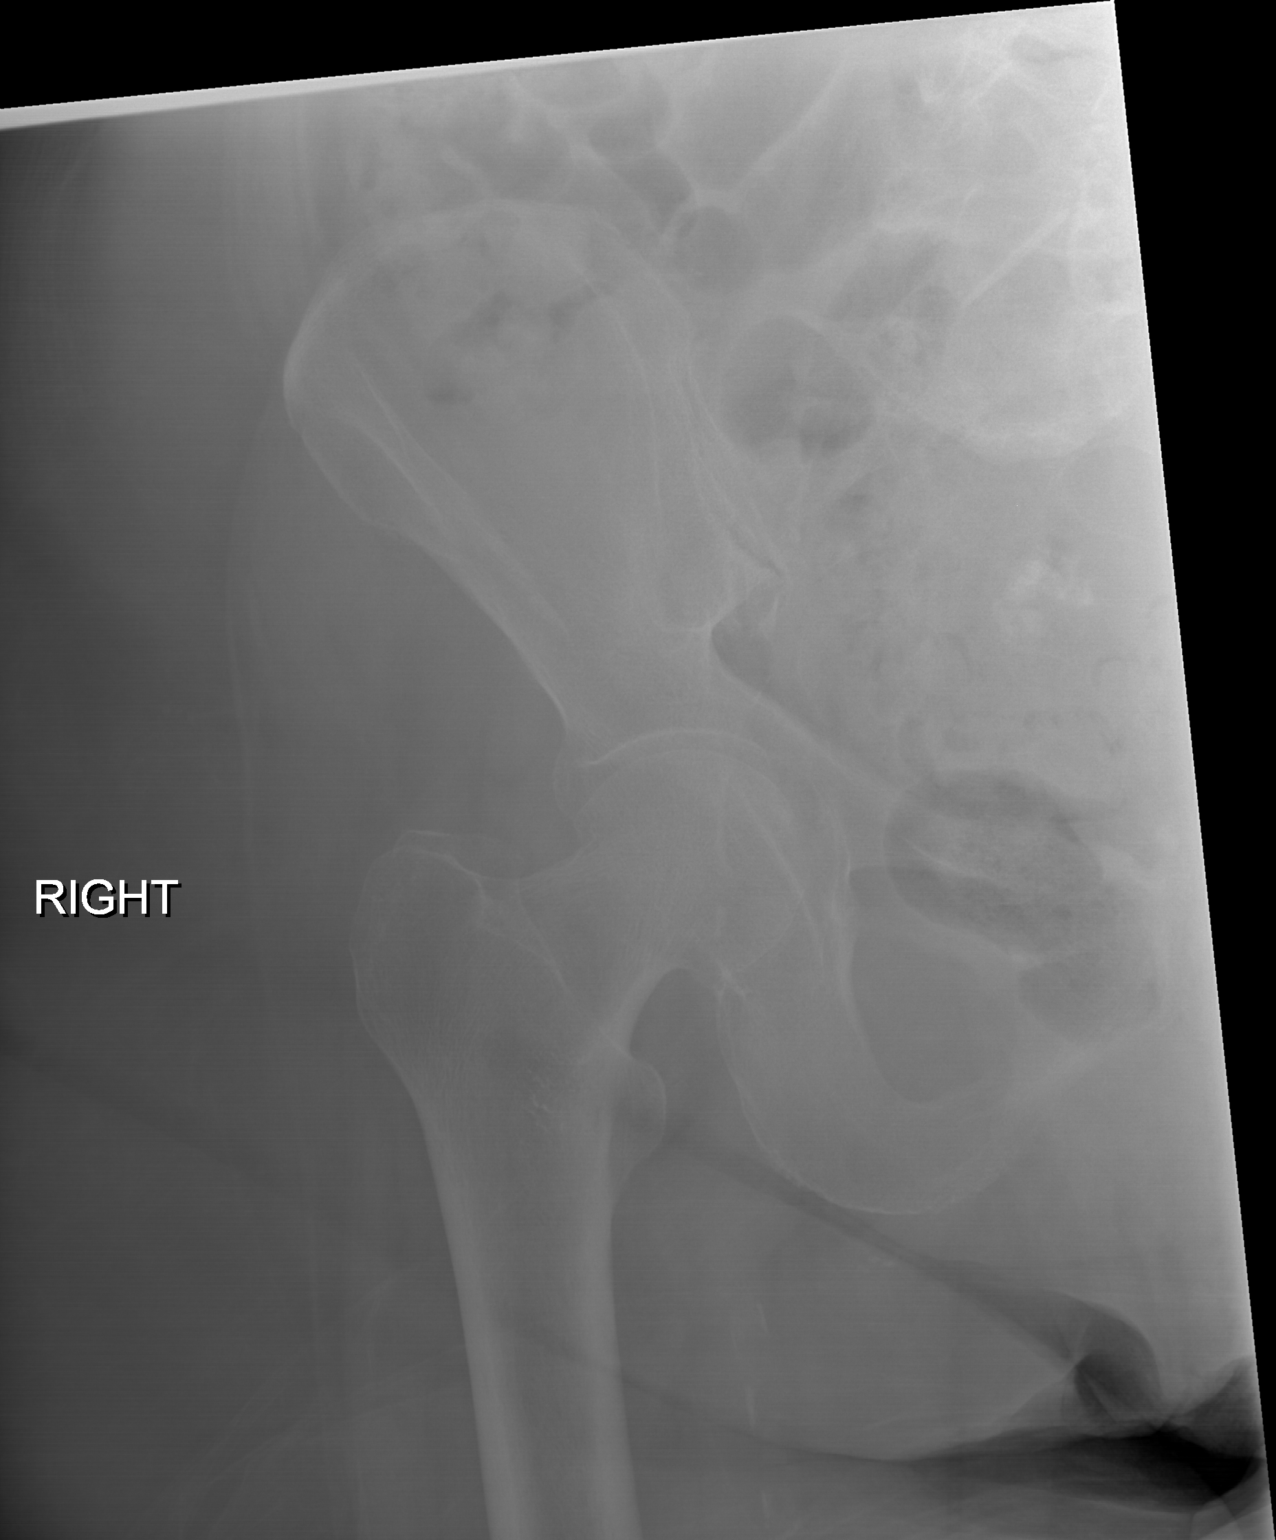

[x hip ap right (2 of 2)]
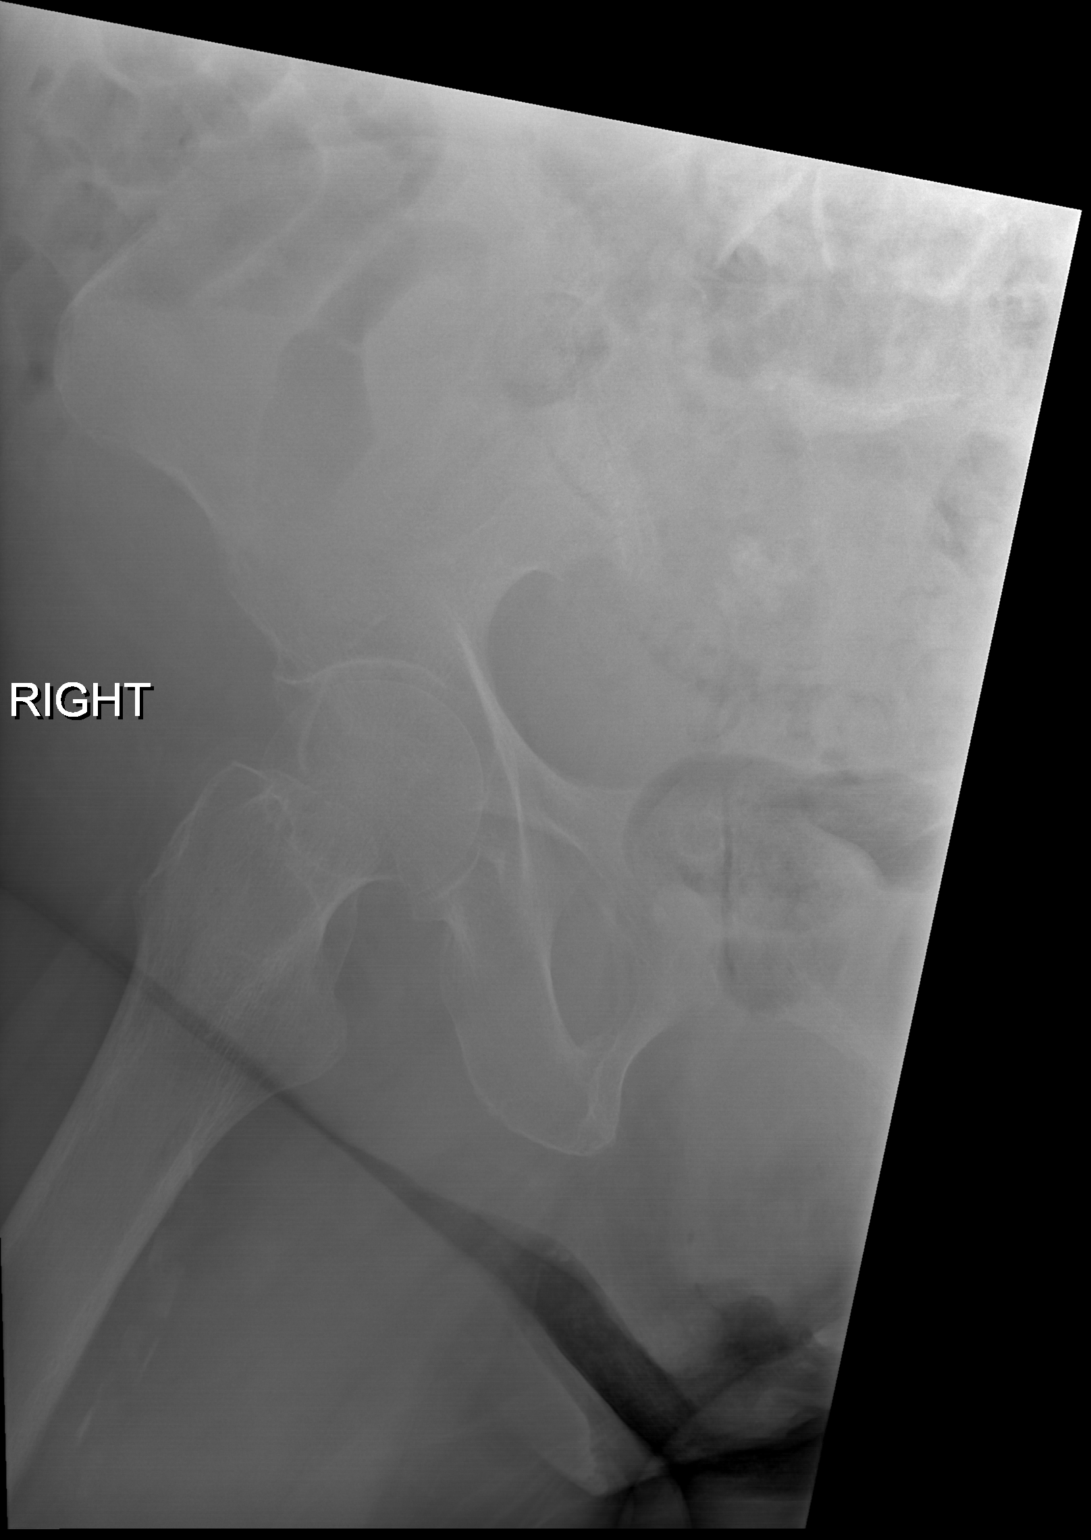

[3 of 3 positions shown; findings below may reference images not displayed]

FINDINGS: Single view of the pelvis and two views of the right hip are
provided. Right femoral head appears well positioned relative to the
adjacent acetabulum. No fracture seen within the right femoral head
or right femoral neck. Left femoral neck cannot be evaluated due to
patient positioning.

No fracture seen within the osseous pelvis although some portions
are obscured by overlying stool and bowel gas.

Degenerative changes are seen within the scoliotic lower lumbar
spine. Atherosclerotic vascular calcifications are seen within the
upper thigh regions bilaterally. Soft tissues about the pelvis and
right hip are otherwise unremarkable.
IMPRESSION: 1. No acute findings. No fracture or dislocation about the right
hip.
2. Degenerative changes within the scoliotic lower lumbar spine.

## 2017-06-09 IMAGING — CR DG CHEST 1V PORT
1 series · 1 of 1 positions shown · non-contrast
Comparison: 09/27/2014

CLINICAL DATA: Tachycardia starting this morning. Recent
bronchitis.

EXAM:
PORTABLE CHEST 1 VIEW

[AP]
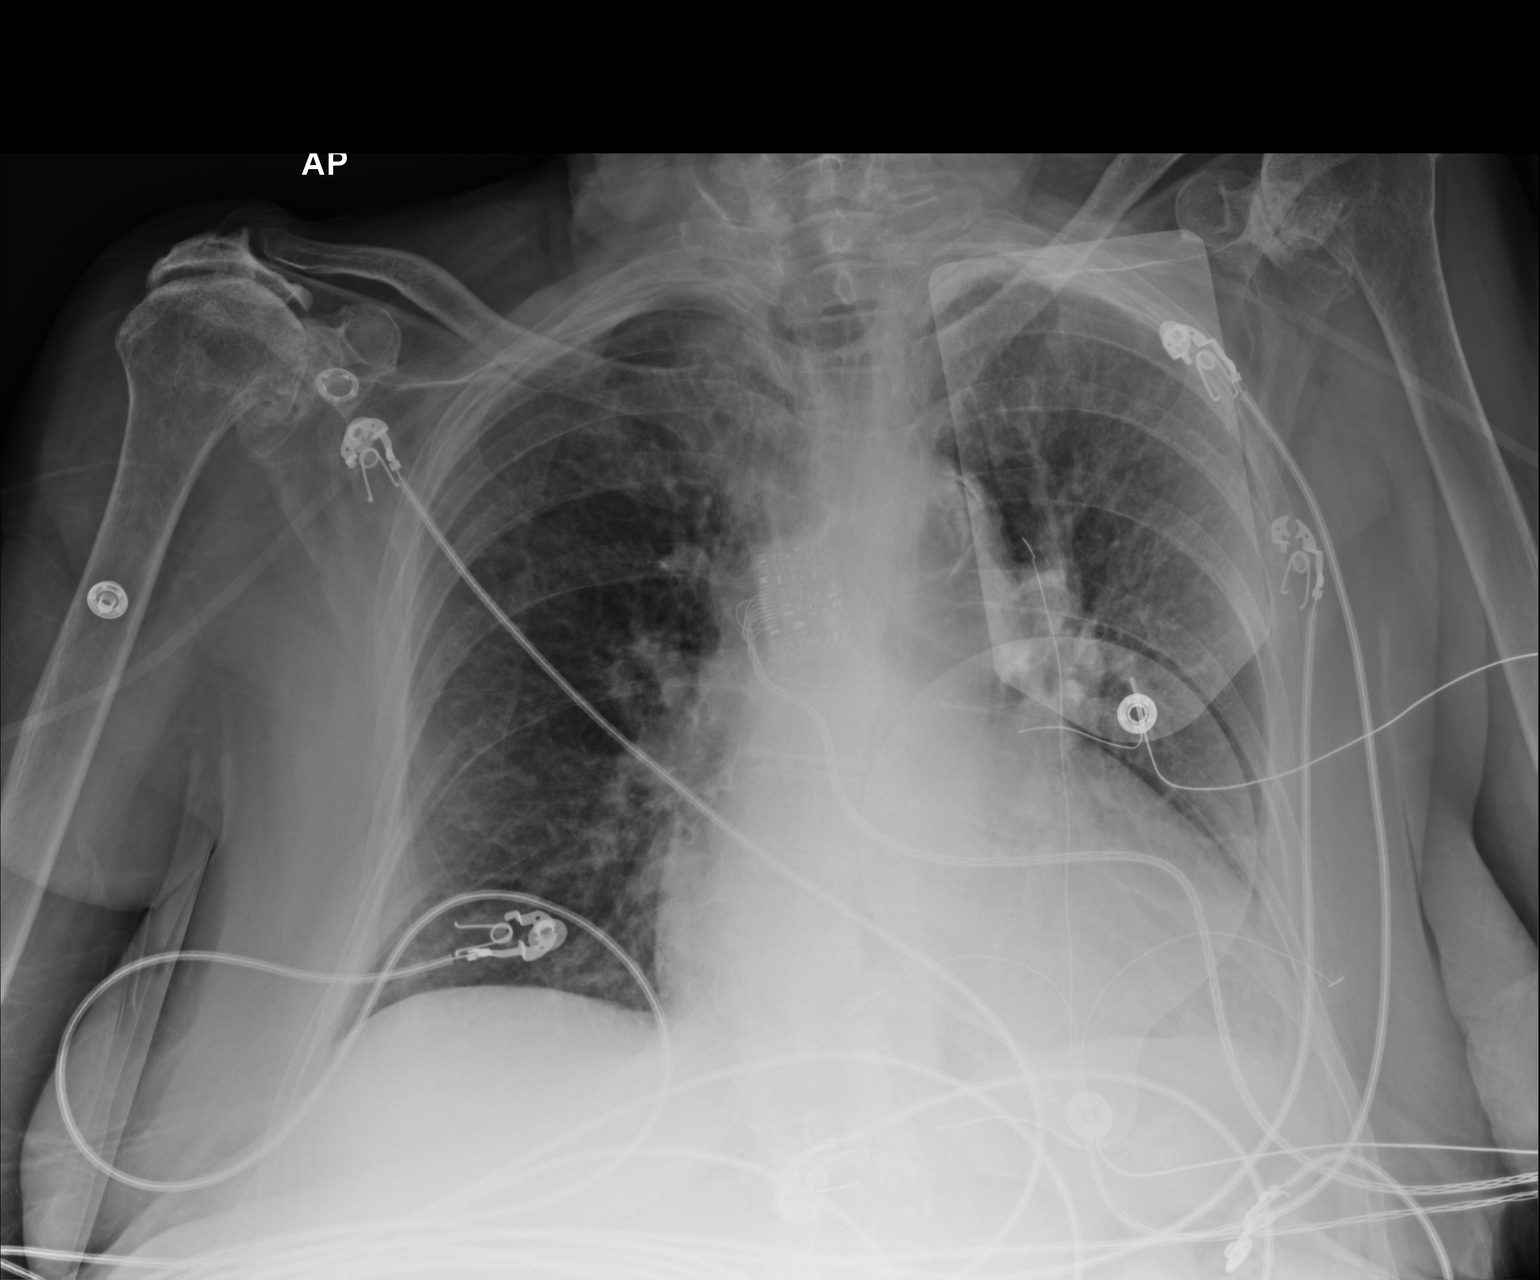

[1 of 1 positions shown; findings below may reference images not displayed]

FINDINGS: Defibrillator patch is noted. Atherosclerotic calcification of the
aortic arch. Stable mild enlargement of the cardiopericardial
silhouette. Upper zone pulmonary vascular prominence is
indeterminate due to the semi erect positioning.

Bilateral chronic rotator cuff tears with delamination of the
acromion humeral space. No significant blunting of the costophrenic
angles.
IMPRESSION: 1. Stable mild enlargement of the cardiopericardial silhouette.
There is upper zone pulmonary vascular prominence which is
nonspecific given the semi erect positioning, but which could
reflect pulmonary venous hypertension. No overt edema.
2. Bilateral chronic rotator cuff tears with severe degenerative
glenohumeral arthropathy bilaterally.
3. Atherosclerotic aortic arch.
